# Patient Record
Sex: Female | Born: 1945 | Race: Black or African American | Hispanic: No | Marital: Married | Smoking: Former smoker
Health system: Southern US, Community
[De-identification: ages and names within clinical notes are randomized; demographics above are authoritative.]

## PROBLEM LIST (undated history)

## (undated) DIAGNOSIS — I7409 Other arterial embolism and thrombosis of abdominal aorta: Secondary | ICD-10-CM

## (undated) DIAGNOSIS — I10 Essential (primary) hypertension: Secondary | ICD-10-CM

## (undated) DIAGNOSIS — Z8249 Family history of ischemic heart disease and other diseases of the circulatory system: Secondary | ICD-10-CM

## (undated) DIAGNOSIS — E785 Hyperlipidemia, unspecified: Secondary | ICD-10-CM

## (undated) HISTORY — DX: Hyperlipidemia, unspecified: E78.5

## (undated) HISTORY — DX: Essential (primary) hypertension: I10

## (undated) HISTORY — DX: Family history of ischemic heart disease and other diseases of the circulatory system: Z82.49

## (undated) HISTORY — DX: Other arterial embolism and thrombosis of abdominal aorta: I74.09

---

## 1993-04-12 HISTORY — PX: ROTATOR CUFF REPAIR: SHX139

## 2002-11-21 ENCOUNTER — Inpatient Hospital Stay (HOSPITAL_COMMUNITY): Admission: EM | Admit: 2002-11-21 | Discharge: 2002-11-26 | Payer: Self-pay | Admitting: Emergency Medicine

## 2002-11-21 ENCOUNTER — Encounter (INDEPENDENT_AMBULATORY_CARE_PROVIDER_SITE_OTHER): Payer: Self-pay | Admitting: Specialist

## 2002-11-21 ENCOUNTER — Encounter: Payer: Self-pay | Admitting: Emergency Medicine

## 2002-11-21 ENCOUNTER — Encounter: Payer: Self-pay | Admitting: Surgery

## 2002-11-21 HISTORY — PX: APPENDECTOMY: SHX54

## 2002-11-22 ENCOUNTER — Encounter: Payer: Self-pay | Admitting: Surgery

## 2002-11-24 ENCOUNTER — Encounter: Payer: Self-pay | Admitting: Surgery

## 2002-12-24 ENCOUNTER — Encounter: Admission: RE | Admit: 2002-12-24 | Discharge: 2002-12-24 | Payer: Self-pay | Admitting: Surgery

## 2002-12-24 ENCOUNTER — Encounter: Payer: Self-pay | Admitting: Surgery

## 2002-12-28 ENCOUNTER — Encounter: Admission: RE | Admit: 2002-12-28 | Discharge: 2002-12-28 | Payer: Self-pay

## 2003-04-30 ENCOUNTER — Ambulatory Visit (HOSPITAL_COMMUNITY): Admission: RE | Admit: 2003-04-30 | Discharge: 2003-04-30 | Payer: Self-pay | Admitting: Cardiology

## 2003-10-30 ENCOUNTER — Encounter: Admission: RE | Admit: 2003-10-30 | Discharge: 2003-10-30 | Payer: Self-pay | Admitting: Family Medicine

## 2004-02-24 ENCOUNTER — Ambulatory Visit (HOSPITAL_COMMUNITY): Admission: RE | Admit: 2004-02-24 | Discharge: 2004-02-24 | Payer: Self-pay | Admitting: Gastroenterology

## 2004-05-06 ENCOUNTER — Encounter: Admission: RE | Admit: 2004-05-06 | Discharge: 2004-05-06 | Payer: Self-pay | Admitting: Internal Medicine

## 2004-05-13 ENCOUNTER — Encounter: Admission: RE | Admit: 2004-05-13 | Discharge: 2004-05-13 | Payer: Self-pay | Admitting: Internal Medicine

## 2004-06-02 ENCOUNTER — Encounter: Admission: RE | Admit: 2004-06-02 | Discharge: 2004-06-02 | Payer: Self-pay | Admitting: Internal Medicine

## 2004-06-30 ENCOUNTER — Encounter: Admission: RE | Admit: 2004-06-30 | Discharge: 2004-06-30 | Payer: Self-pay | Admitting: Internal Medicine

## 2004-08-04 ENCOUNTER — Other Ambulatory Visit: Admission: RE | Admit: 2004-08-04 | Discharge: 2004-08-04 | Payer: Self-pay | Admitting: Interventional Radiology

## 2004-08-04 ENCOUNTER — Encounter (INDEPENDENT_AMBULATORY_CARE_PROVIDER_SITE_OTHER): Payer: Self-pay | Admitting: Specialist

## 2004-08-04 ENCOUNTER — Encounter: Admission: RE | Admit: 2004-08-04 | Discharge: 2004-08-04 | Payer: Self-pay | Admitting: Internal Medicine

## 2005-06-03 ENCOUNTER — Encounter: Admission: RE | Admit: 2005-06-03 | Discharge: 2005-06-03 | Payer: Self-pay | Admitting: Internal Medicine

## 2005-08-26 ENCOUNTER — Encounter: Admission: RE | Admit: 2005-08-26 | Discharge: 2005-08-26 | Payer: Self-pay | Admitting: Internal Medicine

## 2006-04-12 HISTORY — PX: OTHER SURGICAL HISTORY: SHX169

## 2006-05-20 ENCOUNTER — Encounter: Admission: RE | Admit: 2006-05-20 | Discharge: 2006-05-20 | Payer: Self-pay | Admitting: Internal Medicine

## 2006-06-06 ENCOUNTER — Encounter: Admission: RE | Admit: 2006-06-06 | Discharge: 2006-06-06 | Payer: Self-pay | Admitting: Internal Medicine

## 2007-10-24 ENCOUNTER — Encounter: Admission: RE | Admit: 2007-10-24 | Discharge: 2007-10-24 | Payer: Self-pay | Admitting: Internal Medicine

## 2008-07-18 ENCOUNTER — Encounter: Admission: RE | Admit: 2008-07-18 | Discharge: 2008-07-18 | Payer: Self-pay | Admitting: Internal Medicine

## 2008-11-10 DEATH — deceased

## 2008-12-06 ENCOUNTER — Encounter: Admission: RE | Admit: 2008-12-06 | Discharge: 2008-12-06 | Payer: Self-pay | Admitting: Internal Medicine

## 2010-03-02 ENCOUNTER — Encounter: Admission: RE | Admit: 2010-03-02 | Discharge: 2010-03-02 | Payer: Self-pay | Admitting: Internal Medicine

## 2010-08-28 NOTE — Discharge Summary (Signed)
   NAME:  [name], Hannah                           ACCOUNT NO.:  [id number]   MEDICAL RECORD NO.:  [id number]                   PATIENT TYPE:  INP   LOCATION:  0362                                 FACILITY:  [facility]   PHYSICIAN:  Thornton Park. Daphine Deutscher, M.D.             DATE OF BIRTH:  1946/03/16   DATE OF ADMISSION:  11/20/2002  DATE OF DISCHARGE:  11/26/2002                                 DISCHARGE SUMMARY   ADMITTING DIAGNOSES:  Acute appendicitis.   DISCHARGE DIAGNOSES:  1. Acute retrocecal appendicitis.  2. Pneumonia.   HOSPITAL COURSE:  [name] is a 65 year old Jehovah's Witness who  presented to the emergency department and a CT scan had been done there  which was consistent with appendicitis.  Informed consent was obtained on  Wednesday morning regarding this and she was taken to the operating room at  that time where a laparoscopic appendectomy was performed with some  difficulty due to its extracecal location.  However, it was mobilized and  removed and postoperatively she did well.  24 hours into her  hospitalization, however, she developed some fevers and had wheezes and an x-  ray showed evidence possibly of pneumonia.  She was started on Fortaz.  She  was kept on that throughout the balance of her hospitalization.  She  responded well to that and her breathing improved and her bowels opened up  and she was begun on liquids and advanced.  Her hemoglobin stabilized and  her white count, although initially kind of went up, came back down.  She  was getting along well at the time of her discharge.  Incisions looked good.   A CT report questioned some of her vascular anatomy of her aortoiliac  region.  She denied any evidence of claudication or any problems with  vascular problems at all.  Will discuss this with her further in the office  after discharge to see if there are any other studies that might need to be  performed or if it needs to be repeated.   IMPRESSION:   Status post laparoscopic appendectomy for treatment of acute  appendicitis with hospital complication of pneumonia.                                               Thornton Park Daphine Deutscher, M.D.    MBM/MEDQ  D:  11/27/2002  T:  11/27/2002  Job:  [id number]

## 2010-08-28 NOTE — Op Note (Signed)
NAME:  Hannah [name], Hannah [name]               ACCOUNT NO.:  [id number]   MEDICAL RECORD NO.:  [id number]          PATIENT TYPE:  AMB   LOCATION:  ENDO                         FACILITY:  [facility]   PHYSICIAN:  Anselmo Rod, M.D.  DATE OF BIRTH:  1945/06/05   DATE OF PROCEDURE:  02/24/2004  DATE OF DISCHARGE:                                 OPERATIVE REPORT   PROCEDURE:  Screening colonoscopy.   ENDOSCOPIST:  Anselmo Rod, M.D.   INSTRUMENT USED:  Olympus video colonoscope.   INDICATIONS FOR PROCEDURE:  A 65 year old African-American female, Jehovah's  Witness, undergoing a screening colonoscopy to rule out colonic polyps,  masses, etc.   PREPROCEDURE PREPARATION:  Informed consent was procured from the patient.  10% miss rates of colon cancer and polyps have been discussed with the  patient as well.  The patient was fasted for eight hours prior to the  procedure and prepped with a bottle of magnesium citrate and a gallon of  GoLYTELY the night prior to the procedure.   PREPROCEDURE PHYSICAL EXAMINATION:  VITAL SIGNS:  Stable.  NECK:  Supple.  CHEST:  Clear to auscultation.  S1 and S2 regular.  ABDOMEN:  Soft with normal bowel sounds.   DESCRIPTION OF PROCEDURE:  The patient was placed in the left lateral  decubitus position and sedated with 60 mg of Demerol and 6 mg of Versed in  slow incremental doses.  Once the patient was adequately sedated and  maintained on low flow oxygen and continuous cardiac monitoring, the Olympus  video colonoscope was advanced from the rectum to the cecum and terminal  ileum.  The appendiceal orifice and ileocecal valve were clearly visualized  and photographed.  No masses, polyps, erosions, ulcerations, or diverticula  were seen in the terminal ileum.  Small internal hemorrhoids were seen on  retroflexion and the patient tolerated the procedure well without  complications.   IMPRESSION:  1.  Normal colonoscopy up to the terminal ileum except for small  internal      hemorrhoids.  2.  No masses, polyps, or diverticula seen.   RECOMMENDATIONS:  1.  Repeat colonoscopy has been recommended in the next five years unless      the patient develops any abnormal symptoms in the interim.  2.  Outpatient follow-up as need arises in the future.       JNM/MEDQ  D:  02/24/2004  T:  02/24/2004  Job:  [id number]   cc:   Ralene Ok, M.D.  [address]  [place]  [place] [address]  Fax: [phone]   Cristy Hilts. Jacinto Halim, MD  [address]  [place]  [place] [address]  Fax: [phone]

## 2010-08-28 NOTE — Op Note (Signed)
NAME:  Blundell, Anab                           ACCOUNT NO.:  [id number]   MEDICAL RECORD NO.:  [id number]                   PATIENT TYPE:  INP   LOCATION:  0105                                 FACILITY:  [facility]   PHYSICIAN:  Thornton Park. Daphine Deutscher, M.D.             DATE OF BIRTH:  08-02-1945   DATE OF PROCEDURE:  11/21/2002  DATE OF DISCHARGE:                                 OPERATIVE REPORT   PREOPERATIVE DIAGNOSIS:  Acute appendicitis.   POSTOPERATIVE DIAGNOSIS:  Acute retrocecal appendicitis.   OPERATION/PROCEDURE:  Laparoscopic appendectomy.   ANESTHESIA:  General.   SURGEON:  Thornton Park. Daphine Deutscher, M.D.   DESCRIPTION OF PROCEDURE:  Mrs. Fewell is a 65 year old Jehovah's Witness who  was seen in the ED with abdominal pain principally in the right flank which  seemed to be higher than usual for appendix.  She had an ultrasound which  suggested appendicitis but it was only a suggestion.  A CT scan confirmed  this.  Informed consent was obtained.   The patient was taken to the Room #1 after getting 3 g of Unasyn.  The  abdomen was prepped with Betadine and draped sterilely.  A longitudinal  incision was made down to the umbilicus and through a small longitudinal  slit in the fascia entered the abdomen and placed a Hasson cannula.  A 10/11  was placed in the left lower quadrant, a 5 in the upper abdomen and then I  began by mobilizing the cecum which was somewhat floppy and finally I did  find a little purulent fluid in the pelvis. The appendix appeared to be  thickened and edematous and inflamed lateral to the cecum and was enshrouded  in extraperitoneal tissue.  I grasped the cecum and pulled it over.  We used  the Harmonic scalpel to mobilize the appendix laterally, and then grasped  the base and lifted it.  I then was able to get underneath it and staple  across the base of the appendix.  I then elevated the appendix and went  along dividing the mesentery with the Harmonic scalpel.   This enabled me to  complete the mobilization and ultimately to free it from the lateral wall of  the cecum and place it in a bag.  The cecum itself appeared to be in good  shape and I stayed well away from it with the Harmonic.  Once placed in a  bag, it was easily brought out  through the umbilicus.  I went in,  reinspected the area and irrigated it.  There was no evidence of bleeding.  Everything else appeared to be intact.  Port sites had been injected with  Marcaine.  The umbilical port was repaired with two simple sutures of 0  Vicryl under laparoscopic vision.  The abdomen was then deflated and the  scope was withdrawn.  The patient seemed to tolerate this procedure well and  she  was taken to the recovery room in satisfactory condition.                                               Thornton Park Daphine Deutscher, M.D.    MBM/MEDQ  D:  11/21/2002  T:  11/21/2002  Job:  [id number]

## 2010-08-28 NOTE — H&P (Signed)
   NAME:  Hannah [name], Hannah [name]                           ACCOUNT NO.:  [id number]   MEDICAL RECORD NO.:  [id number]                   PATIENT TYPE:  INP   LOCATION:  0356                                 FACILITY:  [facility]   PHYSICIAN:  Thornton Park. Daphine Deutscher, M.D.             DATE OF BIRTH:  [birth year]   DATE OF ADMISSION:  11/20/2002  DATE OF DISCHARGE:                                HISTORY & PHYSICAL   CHIEF COMPLAINT:  Abdominal pain for about 24 hours.  Will evaluate in the  emergency room.   HISTORY:  This is a 65 year old lady with right flank pain, more so to the  right and back, who initially underwent a CT scan.  They questioned whether  there was some thickening of her appendix.  She subsequently underwent a CT  scan which showed evidence of appendicitis.  She was seen in the emergency  room, and informed consent was obtained regarding laparoscopic, as well as  open, appendectomy.   PAST MEDICAL HISTORY:  The patient has a history of anemia and is one of  Jehovah's Witnesses and does not take blood.  She has a history of GERD, but  has had no prior surgery.   ALLERGIES:  She has no known allergies.   CURRENT MEDICATIONS:  1. Remeron.  2. Aciphex.   PHYSICAL EXAMINATION:  GENERAL:  A pleasant black female with tenderness on  the right side.  In general, appeared unremarkable.  Appropriate weight.  VITAL SIGNS:  Temperature 98.9, pulse 98, blood pressure 200/100.  HEENT:  Head normocephalic.  Eyes clear, nonicteric.  Pupils equal, round  and reactive to light.  Nose and throat exam showed normal mucous membranes.  NECK:  Supple without adenopathy or thyromegaly.  CHEST:  Clear.  HEART:  Sinus rhythm without murmurs or gallops.  ABDOMEN:  The pain was above and to the right of McBurney's with tenderness  to palpation.  Bowel sounds were diminished.  EXTREMITIES:  Full range of motion without edema or clubbing.   CT scan was reviewed and was consistent with appendix.  White count  was  10,000.   PLAN:  Laparoscopic appendectomy.                                               Thornton Park Daphine Deutscher, M.D.    MBM/MEDQ  D:  11/23/2002  T:  11/23/2002  Job:  [id number]

## 2011-03-25 ENCOUNTER — Other Ambulatory Visit: Payer: Self-pay | Admitting: Internal Medicine

## 2011-03-25 DIAGNOSIS — Z1231 Encounter for screening mammogram for malignant neoplasm of breast: Secondary | ICD-10-CM

## 2011-04-20 ENCOUNTER — Ambulatory Visit
Admission: RE | Admit: 2011-04-20 | Discharge: 2011-04-20 | Disposition: A | Discharge status: Home / Self Care | Payer: Federal, State, Local not specified - PPO | Source: Ambulatory Visit | Attending: Internal Medicine | Admitting: Internal Medicine

## 2011-04-20 DIAGNOSIS — Z1231 Encounter for screening mammogram for malignant neoplasm of breast: Secondary | ICD-10-CM

## 2011-04-27 ENCOUNTER — Other Ambulatory Visit: Payer: Self-pay | Admitting: Internal Medicine

## 2011-04-27 DIAGNOSIS — R928 Other abnormal and inconclusive findings on diagnostic imaging of breast: Secondary | ICD-10-CM

## 2011-05-04 ENCOUNTER — Ambulatory Visit
Admission: RE | Admit: 2011-05-04 | Discharge: 2011-05-04 | Disposition: A | Discharge status: Home / Self Care | Payer: Federal, State, Local not specified - PPO | Source: Ambulatory Visit | Attending: Internal Medicine | Admitting: Internal Medicine

## 2011-05-04 DIAGNOSIS — R928 Other abnormal and inconclusive findings on diagnostic imaging of breast: Secondary | ICD-10-CM

## 2011-09-21 ENCOUNTER — Other Ambulatory Visit: Payer: Self-pay | Admitting: Internal Medicine

## 2011-09-21 ENCOUNTER — Ambulatory Visit
Admission: RE | Admit: 2011-09-21 | Discharge: 2011-09-21 | Disposition: A | Discharge status: Home / Self Care | Payer: Federal, State, Local not specified - PPO | Source: Ambulatory Visit | Attending: Internal Medicine | Admitting: Internal Medicine

## 2011-09-21 DIAGNOSIS — E041 Nontoxic single thyroid nodule: Secondary | ICD-10-CM

## 2011-09-21 DIAGNOSIS — R634 Abnormal weight loss: Secondary | ICD-10-CM

## 2011-09-27 ENCOUNTER — Ambulatory Visit
Admission: RE | Admit: 2011-09-27 | Discharge: 2011-09-27 | Disposition: A | Discharge status: Home / Self Care | Payer: Medicare Other | Source: Ambulatory Visit | Attending: Internal Medicine | Admitting: Internal Medicine

## 2011-09-27 DIAGNOSIS — E041 Nontoxic single thyroid nodule: Secondary | ICD-10-CM

## 2011-10-11 HISTORY — PX: TRANSTHORACIC ECHOCARDIOGRAM: SHX275

## 2011-10-11 HISTORY — PX: NM MYOCAR PERF WALL MOTION: HXRAD629

## 2011-11-01 ENCOUNTER — Encounter (HOSPITAL_COMMUNITY): Payer: Self-pay | Admitting: Pharmacy Technician

## 2011-11-02 ENCOUNTER — Other Ambulatory Visit: Payer: Self-pay | Admitting: Cardiovascular Disease

## 2011-11-10 ENCOUNTER — Ambulatory Visit (HOSPITAL_COMMUNITY)
Admission: RE | Admit: 2011-11-10 | Discharge: 2011-11-10 | Disposition: A | Discharge status: Home / Self Care | Payer: Medicare Other | Source: Ambulatory Visit | Attending: Cardiovascular Disease | Admitting: Cardiovascular Disease

## 2011-11-10 ENCOUNTER — Encounter (HOSPITAL_COMMUNITY)
Admission: RE | Disposition: A | Discharge status: Home / Self Care | Payer: Self-pay | Source: Ambulatory Visit | Attending: Cardiovascular Disease

## 2011-11-10 DIAGNOSIS — R079 Chest pain, unspecified: Secondary | ICD-10-CM | POA: Insufficient documentation

## 2011-11-10 DIAGNOSIS — I10 Essential (primary) hypertension: Secondary | ICD-10-CM | POA: Insufficient documentation

## 2011-11-10 DIAGNOSIS — Z8249 Family history of ischemic heart disease and other diseases of the circulatory system: Secondary | ICD-10-CM | POA: Insufficient documentation

## 2011-11-10 DIAGNOSIS — I7409 Other arterial embolism and thrombosis of abdominal aorta: Secondary | ICD-10-CM | POA: Insufficient documentation

## 2011-11-10 DIAGNOSIS — R9439 Abnormal result of other cardiovascular function study: Secondary | ICD-10-CM | POA: Insufficient documentation

## 2011-11-10 DIAGNOSIS — E785 Hyperlipidemia, unspecified: Secondary | ICD-10-CM | POA: Insufficient documentation

## 2011-11-10 HISTORY — PX: LEFT HEART CATHETERIZATION WITH CORONARY ANGIOGRAM: SHX5451

## 2011-11-10 SURGERY — LEFT HEART CATHETERIZATION WITH CORONARY ANGIOGRAM
Anesthesia: LOCAL

## 2011-11-10 MED ORDER — MIDAZOLAM HCL 2 MG/2ML IJ SOLN
INTRAMUSCULAR | Status: AC
  Filled 2011-11-10: qty 2

## 2011-11-10 MED ORDER — DIAZEPAM 5 MG PO TABS
5.0000 mg | ORAL_TABLET | ORAL | Status: AC
  Administered 2011-11-10: 5 mg via ORAL
  Filled 2011-11-10: qty 1

## 2011-11-10 MED ORDER — LIDOCAINE HCL (PF) 1 % IJ SOLN
INTRAMUSCULAR | Status: AC
  Filled 2011-11-10: qty 30

## 2011-11-10 MED ORDER — ACETAMINOPHEN 325 MG PO TABS: 650.0000 mg | ORAL_TABLET | ORAL | Status: DC | PRN

## 2011-11-10 MED ORDER — HEPARIN SODIUM (PORCINE) 1000 UNIT/ML IJ SOLN
INTRAMUSCULAR | Status: AC
  Filled 2011-11-10: qty 1

## 2011-11-10 MED ORDER — NITROGLYCERIN 0.2 MG/ML ON CALL CATH LAB
INTRAVENOUS | Status: AC
  Filled 2011-11-10: qty 1

## 2011-11-10 MED ORDER — ONDANSETRON HCL 4 MG/2ML IJ SOLN: 4.0000 mg | Freq: Four times a day (QID) | INTRAMUSCULAR | Status: DC | PRN

## 2011-11-10 MED ORDER — VERAPAMIL HCL 2.5 MG/ML IV SOLN
INTRAVENOUS | Status: AC
  Filled 2011-11-10: qty 2

## 2011-11-10 MED ORDER — SODIUM CHLORIDE 0.9 % IV SOLN: INTRAVENOUS | Status: DC

## 2011-11-10 MED ORDER — SODIUM CHLORIDE 0.9 % IV SOLN
INTRAVENOUS | Status: DC
  Administered 2011-11-10: 1000 mL via INTRAVENOUS

## 2011-11-10 MED ORDER — MORPHINE SULFATE 4 MG/ML IJ SOLN: 1.0000 mg | INTRAMUSCULAR | Status: DC | PRN

## 2011-11-10 MED ORDER — HEPARIN (PORCINE) IN NACL 2-0.9 UNIT/ML-% IJ SOLN
INTRAMUSCULAR | Status: AC
  Filled 2011-11-10: qty 2000

## 2011-11-10 MED ORDER — SODIUM CHLORIDE 0.9 % IJ SOLN: 3.0000 mL | INTRAMUSCULAR | Status: DC | PRN

## 2011-11-10 MED ORDER — FENTANYL CITRATE 0.05 MG/ML IJ SOLN
INTRAMUSCULAR | Status: AC
  Filled 2011-11-10: qty 2

## 2011-11-10 NOTE — Op Note (Signed)
Hannah [name] is a 66 y.o. female    [id number] LOCATION:  FACILITY: [facility]  PHYSICIAN: Nanetta Batty, M.D. [birth year]   DATE OF PROCEDURE:  11/10/2011  DATE OF DISCHARGE:  [facility]  CARDIAC CATHETERIZATION     History obtained from chart review. 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 She does not lifestyle limiting claudication. Refractors include hypertension, hyperlipidemia, remote tobacco abuse having quit 20 years ago. She has a strong family history of heart disease the father died of MI in his 71s and 2 brothers who have had bypass surgery. When I saw her back in February she had mild dyspnea on exertion but denied chest pain. Over the last several weeks she developed substernal chest burning going into both upper arms. A recent Myoview showed subtle ischemia in the LAD territory. Based on this she presents now for outpatient diagnostic coronary arteriography via the right radial approach.   PROCEDURE DESCRIPTION:    The patient was brought to the second floor  [facility] lab in the postabsorptive state. She was  premedicated with Valium 5 mg by mouth, IV Versed and fentanyl.. Her right wrist was prepped and shaved in usual sterile fashion. Xylocaine 1% was used  for local anesthesia. A 5 French sheath was inserted into the right radial  artery using standard Seldinger technique. The patient received  3000 units  of heparin  intravenously.  10 cc of "radial cocktail" were administered the SideArm sheath. A 5 [place] TIG catheter along the 5 French pigtail catheter were used for selective coronary angiography, left ventriculography, and distal abdominal aortography. Omnipaque dye was used for the entirety of the case. Retrograde aortic, left ventricular and pullback pressures were  recorded.    HEMODYNAMICS:    AO SYSTOLIC/AO DIASTOLIC: 95/52   LV SYSTOLIC/LV DIASTOLIC: 97/10  ANGIOGRAPHIC RESULTS:   1. Left main; normal  2. LAD; normal 3. Left circumflex; normal.  4. Right coronary artery; nondominant and normal 5. Left ventriculography; RAO left ventriculogram was performed using  25 mL Without wall motion abnormalities.  6. 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 The iliac arteries reconstituted by lumbar and IMA collaterals.  IMPRESSION:[name] had normal coronary arteries and normal left foot function. I believe her stress test was false positive or symptoms noncardiac. 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 It would be a good candidate for aortobifemoral bypass grafting for lifestyle in the claudication. The sheath was removed from the right radial artery and a TR band was placed. Patent hemostasis was achieved. The patient left the Cath Lab in stable condition. She was gently hydrated, and discharged home after 3 hours. I'll see her back in the office in one to 2 weeks for followup.  Runell Gess MD, [facility] 11/10/2011 10:01 AM

## 2011-11-10 NOTE — H&P (Signed)
  H & P will be scanned in.  Pt was reexamined and existing H & P reviewed. No changes found.  Runell Gess, MD [facility] 11/10/2011 9:53 AM

## 2012-06-01 ENCOUNTER — Other Ambulatory Visit: Payer: Self-pay | Admitting: Internal Medicine

## 2012-06-01 DIAGNOSIS — Z1231 Encounter for screening mammogram for malignant neoplasm of breast: Secondary | ICD-10-CM

## 2012-06-23 ENCOUNTER — Ambulatory Visit: Payer: Medicare Other

## 2012-07-12 ENCOUNTER — Ambulatory Visit: Payer: Medicare Other

## 2012-07-21 ENCOUNTER — Ambulatory Visit
Admission: RE | Admit: 2012-07-21 | Discharge: 2012-07-21 | Disposition: A | Discharge status: Home / Self Care | Payer: Medicare Other | Source: Ambulatory Visit | Attending: Internal Medicine | Admitting: Internal Medicine

## 2012-07-21 DIAGNOSIS — Z1231 Encounter for screening mammogram for malignant neoplasm of breast: Secondary | ICD-10-CM

## 2012-10-12 ENCOUNTER — Other Ambulatory Visit: Payer: Self-pay | Admitting: Cardiovascular Disease

## 2013-06-18 ENCOUNTER — Other Ambulatory Visit: Payer: Self-pay

## 2013-06-18 DIAGNOSIS — Z1231 Encounter for screening mammogram for malignant neoplasm of breast: Secondary | ICD-10-CM

## 2013-06-18 DIAGNOSIS — Z803 Family history of malignant neoplasm of breast: Secondary | ICD-10-CM

## 2013-06-25 ENCOUNTER — Ambulatory Visit (INDEPENDENT_AMBULATORY_CARE_PROVIDER_SITE_OTHER): Payer: Medicare Other | Admitting: Cardiovascular Disease

## 2013-06-25 ENCOUNTER — Encounter: Payer: Self-pay | Admitting: Cardiovascular Disease

## 2013-06-25 VITALS — BP 108/72 | HR 76 | Ht 62.0 in | Wt 123.0 lb

## 2013-06-25 DIAGNOSIS — I739 Peripheral vascular disease, unspecified: Secondary | ICD-10-CM

## 2013-06-25 DIAGNOSIS — E785 Hyperlipidemia, unspecified: Secondary | ICD-10-CM

## 2013-06-25 DIAGNOSIS — I10 Essential (primary) hypertension: Secondary | ICD-10-CM | POA: Insufficient documentation

## 2013-06-25 HISTORY — DX: Essential (primary) hypertension: I10

## 2013-06-25 HISTORY — DX: Peripheral vascular disease, unspecified: I73.9

## 2013-06-25 HISTORY — DX: Hyperlipidemia, unspecified: E78.5

## 2013-06-25 NOTE — Patient Instructions (Signed)
Your physician wants you to follow-up in: 1 year with Dr Berry. You will receive a reminder letter in the mail two months in advance. If you don't receive a letter, please call our office to schedule the follow-up appointment.  

## 2013-06-25 NOTE — Assessment & Plan Note (Signed)
On statin therapy followed by her PCP 

## 2013-06-25 NOTE — Progress Notes (Signed)
06/25/2013 Hannah [name]   Feb 01, 1946  [id number]  Primary Physician No PCP Per Patient Primary Cardiologist: Runell Gess MD FACP,FACC,FAHA, FSCAI   HPI:. She is a 68 year old thin-appearing married [place] American female, mother of 1 child, who I last saw 6 months ago. She has a history of normal coronary arteries by catheterization, which I performed November 10, 2011. 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 She really denies significant lifestyle-limiting claudication. Her other problems include remote tobacco abuse, having quit over 20 years ago, treated hypertension, hyperlipidemia, as well as a strong family history for heart disease with a father who had an MI in his 14s and 2 brothers who have had bypass surgery. This year ago she denies chest pain, shortness of breath or claudication. Her primary care physician, Dr. Ludwig Clarks, follows her lipid profile closely.   Current Outpatient Prescriptions  Medication Sig Dispense Refill  . aspirin EC 81 MG tablet Take 81 mg by mouth daily.      . clopidogrel (PLAVIX) 75 MG tablet Take 75 mg by mouth daily.      . Coenzyme Q-10 200 MG CAPS Take 1 capsule by mouth daily.      . diazepam (VALIUM) 5 MG tablet Take 0.5 mg by mouth daily.      . hydrochlorothiazide (MICROZIDE) 12.5 MG capsule Take 12.5 mg by mouth daily.      . metoprolol tartrate (LOPRESSOR) 25 MG tablet TAKE 1/2 TABLET BY MOUTH TWICE DAILY  30 tablet  7  . nitroGLYCERIN (NITROSTAT) 0.4 MG SL tablet Place 0.4 mg under the tongue every 5 (five) minutes as needed. For chest pain      . pantoprazole (PROTONIX) 40 MG tablet Take 40 mg by mouth daily.      . ramipril (ALTACE) 10 MG capsule Take 10 mg by mouth daily.      . rosuvastatin (CRESTOR) 40 MG tablet Take 40 mg by mouth daily.       No current facility-administered medications for this  visit.    No Known Allergies  History   Social History  . Marital Status: Married    Spouse Name: N/A    Number of Children: N/A  . Years of Education: N/A   Occupational History  . Not on file.   Social History Main Topics  . Smoking status: Former Games developer  . Smokeless tobacco: Not on file  . Alcohol Use: Not on file  . Drug Use: Not on file  . Sexual Activity: Not on file   Other Topics Concern  . Not on file   Social History Narrative  . No narrative on file     Review of Systems: General: negative for chills, fever, night sweats or weight changes.  Cardiovascular: negative for chest pain, dyspnea on exertion, edema, orthopnea, palpitations, paroxysmal nocturnal dyspnea or shortness of breath Dermatological: negative for rash Respiratory: negative for cough or wheezing Urologic: negative for hematuria Abdominal: negative for nausea, vomiting, diarrhea, bright red blood per rectum, melena, or hematemesis Neurologic: negative for visual changes, syncope, or dizziness All other systems reviewed and are otherwise negative except as noted above.    Blood pressure 108/72, pulse 76, height 5\' 2"  (1.575 m), weight 123 lb (55.792 kg).  General appearance: alert and no distress Neck: no adenopathy, no carotid bruit, no JVD, supple, symmetrical, trachea midline and thyroid not enlarged, symmetric,  no tenderness/mass/nodules Lungs: clear to auscultation bilaterally Heart: regular rate and rhythm, S1, S2 normal, no murmur, click, rub or gallop Abdomen: soft, non-tender; bowel sounds normal; no masses,  no organomegaly Extremities: extremities normal, atraumatic, no cyanosis or edema and and pedal pulses  EKG normal sinus rhythm at 76 without ST or T wave changes  ASSESSMENT AND PLAN:   Essential hypertension Controlled on current medications  Hyperlipidemia On statin therapy followed by her PCP  Peripheral arterial disease History of known distal abdominal aortic  occlusion from cardiac cath performed 2 years ago. She really denies claudication      Runell GessJonathan J. Annelyse Rey MD [facility] DistrictFACP,FACC,FAHA, [facility] 06/25/2013 3:50 PM

## 2013-06-25 NOTE — Assessment & Plan Note (Signed)
History of known distal abdominal aortic occlusion from cardiac cath performed 2 years ago. She really denies claudication

## 2013-06-25 NOTE — Assessment & Plan Note (Signed)
Controlled on current medications 

## 2013-08-06 ENCOUNTER — Ambulatory Visit: Payer: Medicare Other

## 2013-08-10 ENCOUNTER — Encounter (INDEPENDENT_AMBULATORY_CARE_PROVIDER_SITE_OTHER): Payer: Self-pay

## 2013-08-10 ENCOUNTER — Ambulatory Visit
Admission: RE | Admit: 2013-08-10 | Discharge: 2013-08-10 | Disposition: A | Discharge status: Home / Self Care | Payer: Medicare Other | Source: Ambulatory Visit

## 2013-08-10 DIAGNOSIS — Z803 Family history of malignant neoplasm of breast: Secondary | ICD-10-CM

## 2013-08-10 DIAGNOSIS — Z1231 Encounter for screening mammogram for malignant neoplasm of breast: Secondary | ICD-10-CM

## 2013-08-13 ENCOUNTER — Other Ambulatory Visit: Payer: Self-pay | Admitting: Internal Medicine

## 2013-08-13 DIAGNOSIS — N63 Unspecified lump in unspecified breast: Secondary | ICD-10-CM

## 2013-08-27 ENCOUNTER — Other Ambulatory Visit: Payer: Medicare Other

## 2013-08-31 ENCOUNTER — Other Ambulatory Visit: Payer: Self-pay | Admitting: Internal Medicine

## 2013-08-31 ENCOUNTER — Ambulatory Visit
Admission: RE | Admit: 2013-08-31 | Discharge: 2013-08-31 | Disposition: A | Discharge status: Home / Self Care | Payer: Medicare Other | Source: Ambulatory Visit | Attending: Internal Medicine | Admitting: Internal Medicine

## 2013-08-31 ENCOUNTER — Ambulatory Visit
Admission: RE | Admit: 2013-08-31 | Discharge: 2013-08-31 | Disposition: A | Discharge status: Home / Self Care | Payer: Medicare Other | Source: Ambulatory Visit

## 2013-08-31 DIAGNOSIS — N63 Unspecified lump in unspecified breast: Secondary | ICD-10-CM

## 2013-09-13 ENCOUNTER — Encounter: Payer: Self-pay | Admitting: Cardiovascular Disease

## 2014-01-04 ENCOUNTER — Encounter: Payer: Self-pay | Admitting: Cardiovascular Disease

## 2014-03-21 ENCOUNTER — Encounter (HOSPITAL_COMMUNITY): Payer: Self-pay | Admitting: Cardiovascular Disease

## 2014-06-27 ENCOUNTER — Telehealth: Payer: Self-pay | Admitting: Cardiovascular Disease

## 2014-07-01 NOTE — Telephone Encounter (Signed)
Closed encounter °

## 2014-07-04 ENCOUNTER — Encounter: Payer: Self-pay | Admitting: *Deleted

## 2014-07-12 ENCOUNTER — Encounter: Payer: Self-pay | Admitting: Cardiovascular Disease

## 2014-07-16 ENCOUNTER — Ambulatory Visit: Payer: Medicare Other | Admitting: Cardiovascular Disease

## 2014-07-23 ENCOUNTER — Telehealth: Payer: Self-pay | Admitting: Cardiovascular Disease

## 2014-07-23 NOTE — Telephone Encounter (Signed)
Received records from Dr Ralene Okoy Moreira for appointment with Dr Allyson SabalBerry on 08/09/14.  Records given to [employer] (medical records) for Dr Hazle CocaBerry's schedule on 08/09/14.  lp

## 2014-08-09 ENCOUNTER — Encounter: Payer: Self-pay | Admitting: Cardiovascular Disease

## 2014-08-09 ENCOUNTER — Ambulatory Visit (INDEPENDENT_AMBULATORY_CARE_PROVIDER_SITE_OTHER): Payer: Medicare Other | Admitting: Cardiovascular Disease

## 2014-08-09 VITALS — BP 142/80 | HR 76 | Ht 62.5 in | Wt 122.6 lb

## 2014-08-09 DIAGNOSIS — I10 Essential (primary) hypertension: Secondary | ICD-10-CM | POA: Diagnosis not present

## 2014-08-09 DIAGNOSIS — E785 Hyperlipidemia, unspecified: Secondary | ICD-10-CM | POA: Diagnosis not present

## 2014-08-09 DIAGNOSIS — I739 Peripheral vascular disease, unspecified: Secondary | ICD-10-CM | POA: Diagnosis not present

## 2014-08-09 DIAGNOSIS — R079 Chest pain, unspecified: Secondary | ICD-10-CM

## 2014-08-09 HISTORY — DX: Chest pain, unspecified: R07.9

## 2014-08-09 NOTE — Assessment & Plan Note (Signed)
The patient has had chest pain for the last 2 weeks. She went to the emergency room but was not seen by ER physician. She's had pain on a daily basis. She is on antireflux medications. Of note, at the time of cardiac catheterization she would had angiographic documented normal coronary arteries performed 11/10/11. While it is unlikely that she's developed significant CAD in that short time. I think it warrants getting a pharmacologic Myoview stress test to rule out an ischemic etiology.

## 2014-08-09 NOTE — Progress Notes (Signed)
08/09/2014 Hannah FisherMarilyn M [name]   04/13/1945  [id number]  Primary Physician No PCP Per Patient Primary Cardiologist: Runell GessJonathan J. Rashid Whitenight MD FACP,FACC,FAHA, FSCAI   HPI:  . She is a 69 year old thin-appearing married [place] American female, mother of 1 child, who I last saw 13 months ago. She has a history of normal coronary arteries by catheterization, which I performed November 10, 2011. 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 She really denies significant lifestyle-limiting claudication. Her other problems include remote tobacco abuse, having quit over 20 years ago, treated hypertension, hyperlipidemia, as well as a strong family history for heart disease with a father who had an MI in his 7730s and 2 brothers who have had bypass surgery. Since I saw her a year ago she has had new onset chest pain or last several weeks. She went to the emergency room but did not see an ER physician. She does have a history of GERD on Protonix. She still denies claudication.  Current Outpatient Prescriptions  Medication Sig Dispense Refill  . aspirin EC 81 MG tablet Take 81 mg by mouth daily.    . clopidogrel (PLAVIX) 75 MG tablet Take 75 mg by mouth daily.    . Coenzyme Q-10 200 MG CAPS Take 1 capsule by mouth daily.    . diazepam (VALIUM) 5 MG tablet Take 0.5 mg by mouth daily.    . hydrochlorothiazide (MICROZIDE) 12.5 MG capsule Take 12.5 mg by mouth daily.    . metoprolol tartrate (LOPRESSOR) 25 MG tablet TAKE 1/2 TABLET BY MOUTH TWICE DAILY 30 tablet 7  . pantoprazole (PROTONIX) 40 MG tablet Take 40 mg by mouth daily.    . ramipril (ALTACE) 10 MG capsule Take 10 mg by mouth daily.    . rosuvastatin (CRESTOR) 40 MG tablet Take 40 mg by mouth daily.    . Vitamin D, Ergocalciferol, (DRISDOL) 50000 UNITS CAPS capsule Take 1 capsule by mouth once a week.  5   No current  facility-administered medications for this visit.    No Known Allergies  History   Social History  . Marital Status: Married    Spouse Name: N/A  . Number of Children: 1  . Years of Education: 12   Occupational History  . Not on file.   Social History Main Topics  . Smoking status: Former Games developermoker  . Smokeless tobacco: Not on file  . Alcohol Use: Not on file  . Drug Use: Not on file  . Sexual Activity: Not on file   Other Topics Concern  . Not on file   Social History Narrative     Review of Systems: General: negative for chills, fever, night sweats or weight changes.  Cardiovascular: negative for chest pain, dyspnea on exertion, edema, orthopnea, palpitations, paroxysmal nocturnal dyspnea or shortness of breath Dermatological: negative for rash Respiratory: negative for cough or wheezing Urologic: negative for hematuria Abdominal: negative for nausea, vomiting, diarrhea, bright red blood per rectum, melena, or hematemesis Neurologic: negative for visual changes, syncope, or dizziness All other systems reviewed and are otherwise negative except as noted above.    Blood pressure 142/80, pulse 76, height 5' 2.5" (1.588 m), weight 122 lb 9.6 oz (55.611 kg).  General appearance: alert and no distress Neck: no adenopathy, no carotid bruit, no JVD, supple, symmetrical, trachea midline and thyroid not enlarged, symmetric, no tenderness/mass/nodules Lungs: clear to auscultation bilaterally Heart:  regular rate and rhythm, S1, S2 normal, no murmur, click, rub or gallop Extremities: extremities normal, atraumatic, no cyanosis or edema  EKG normal sinus rhythm at 76 without ST or T-wave changes. I personally reviewed this EKG  ASSESSMENT AND PLAN:   Peripheral arterial disease History of peripheral arterial disease with angiographically documented Leriche syndrome but with minimal symptoms.   Hyperlipidemia History of hyperlipidemia on Crestor 40 mg a day followed by her  PCP   Essential hypertension History of hypertension blood pressure measured at 142/80. She is on hydrochlorothiazide, ramipril and metoprolol. Continue current meds at current dosing   Chest pain The patient has had chest pain for the last 2 weeks. She went to the emergency room but was not seen by ER physician. She's had pain on a daily basis. She is on antireflux medications. Of note, at the time of cardiac catheterization she would had angiographic documented normal coronary arteries performed 11/10/11. While it is unlikely that she's developed significant CAD in that short time. I think it warrants getting a pharmacologic Myoview stress test to rule out an ischemic etiology.       Runell Gess MD FACP,FACC,FAHA, [facility] 08/09/2014 2:52 PM

## 2014-08-09 NOTE — Assessment & Plan Note (Signed)
History of hyperlipidemia on Crestor 40 mg a day followed by her PCP 

## 2014-08-09 NOTE — Assessment & Plan Note (Signed)
History of hypertension blood pressure measured at 142/80. She is on hydrochlorothiazide, ramipril and metoprolol. Continue current meds at current dosing

## 2014-08-09 NOTE — Patient Instructions (Signed)
Dr Allyson SabalBerry has requested that you have a lexiscan myoview. For further information please visit [email or web address] Please follow instruction sheet, as given.  Dr Allyson SabalBerry recommends that you schedule a follow-up appointment in 1 year. You will receive a reminder letter in the mail two months in advance. If you don't receive a letter, please call our office to schedule the follow-up appointment.

## 2014-08-09 NOTE — Assessment & Plan Note (Signed)
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

## 2014-08-20 ENCOUNTER — Other Ambulatory Visit: Payer: Self-pay | Admitting: Gastroenterology

## 2014-08-20 DIAGNOSIS — R1011 Right upper quadrant pain: Secondary | ICD-10-CM

## 2014-08-20 DIAGNOSIS — R110 Nausea: Secondary | ICD-10-CM

## 2014-08-23 ENCOUNTER — Ambulatory Visit
Admission: RE | Admit: 2014-08-23 | Discharge: 2014-08-23 | Disposition: A | Discharge status: Home / Self Care | Payer: Medicare Other | Source: Ambulatory Visit | Attending: Internal Medicine | Admitting: Internal Medicine

## 2014-08-23 ENCOUNTER — Other Ambulatory Visit: Payer: Self-pay | Admitting: Internal Medicine

## 2014-08-23 DIAGNOSIS — R059 Cough, unspecified: Secondary | ICD-10-CM

## 2014-08-23 DIAGNOSIS — R05 Cough: Secondary | ICD-10-CM

## 2014-08-26 ENCOUNTER — Ambulatory Visit (HOSPITAL_COMMUNITY)
Admission: RE | Admit: 2014-08-26 | Discharge: 2014-08-26 | Disposition: A | Discharge status: Home / Self Care | Payer: Medicare Other | Source: Ambulatory Visit | Attending: Gastroenterology | Admitting: Gastroenterology

## 2014-08-26 DIAGNOSIS — R1011 Right upper quadrant pain: Secondary | ICD-10-CM | POA: Insufficient documentation

## 2014-08-26 DIAGNOSIS — R110 Nausea: Secondary | ICD-10-CM | POA: Diagnosis not present

## 2014-08-27 ENCOUNTER — Encounter (HOSPITAL_COMMUNITY): Payer: Medicare Other

## 2014-10-07 ENCOUNTER — Other Ambulatory Visit: Payer: Self-pay

## 2014-10-18 ENCOUNTER — Other Ambulatory Visit: Payer: Self-pay

## 2014-10-18 DIAGNOSIS — Z803 Family history of malignant neoplasm of breast: Secondary | ICD-10-CM

## 2014-10-18 DIAGNOSIS — Z1231 Encounter for screening mammogram for malignant neoplasm of breast: Secondary | ICD-10-CM

## 2014-10-25 ENCOUNTER — Other Ambulatory Visit: Payer: Self-pay | Admitting: Internal Medicine

## 2014-10-25 DIAGNOSIS — M8580 Other specified disorders of bone density and structure, unspecified site: Secondary | ICD-10-CM

## 2014-11-26 ENCOUNTER — Ambulatory Visit
Admission: RE | Admit: 2014-11-26 | Discharge: 2014-11-26 | Disposition: A | Discharge status: Home / Self Care | Payer: Medicare Other | Source: Ambulatory Visit

## 2014-11-26 ENCOUNTER — Ambulatory Visit
Admission: RE | Admit: 2014-11-26 | Discharge: 2014-11-26 | Disposition: A | Discharge status: Home / Self Care | Payer: Medicare Other | Source: Ambulatory Visit | Attending: Internal Medicine | Admitting: Internal Medicine

## 2014-11-26 DIAGNOSIS — Z1231 Encounter for screening mammogram for malignant neoplasm of breast: Secondary | ICD-10-CM

## 2014-11-26 DIAGNOSIS — M8580 Other specified disorders of bone density and structure, unspecified site: Secondary | ICD-10-CM

## 2014-11-26 DIAGNOSIS — Z803 Family history of malignant neoplasm of breast: Secondary | ICD-10-CM

## 2015-09-17 ENCOUNTER — Ambulatory Visit (INDEPENDENT_AMBULATORY_CARE_PROVIDER_SITE_OTHER): Payer: Medicare Other | Admitting: Cardiovascular Disease

## 2015-09-17 ENCOUNTER — Encounter (INDEPENDENT_AMBULATORY_CARE_PROVIDER_SITE_OTHER): Payer: Medicare Other

## 2015-09-17 ENCOUNTER — Encounter: Payer: Self-pay | Admitting: Cardiovascular Disease

## 2015-09-17 VITALS — BP 134/72 | HR 72 | Ht 61.0 in | Wt 121.0 lb

## 2015-09-17 DIAGNOSIS — I10 Essential (primary) hypertension: Secondary | ICD-10-CM

## 2015-09-17 DIAGNOSIS — R079 Chest pain, unspecified: Secondary | ICD-10-CM | POA: Diagnosis not present

## 2015-09-17 DIAGNOSIS — R0602 Shortness of breath: Secondary | ICD-10-CM

## 2015-09-17 DIAGNOSIS — I739 Peripheral vascular disease, unspecified: Secondary | ICD-10-CM

## 2015-09-17 DIAGNOSIS — R002 Palpitations: Secondary | ICD-10-CM

## 2015-09-17 DIAGNOSIS — E785 Hyperlipidemia, unspecified: Secondary | ICD-10-CM

## 2015-09-17 NOTE — Assessment & Plan Note (Signed)
History of hypertension with blood pressure measured at 134/72 She is on hydrochlorothiazide, metoprolol and ramipril. Continue current meds at current dosing

## 2015-09-17 NOTE — Progress Notes (Deleted)
     09/17/2015 Alver FisherMarilyn M [name]   02/13/1946  [id number]  Primary Physician Ralene OkMOREIRA,ROY, MD Primary Cardiologist: Runell GessJonathan J [name] MD Roseanne RenoFACP, FACC, FAHA, FSCAI  HPI:  ***   Current Outpatient Prescriptions  Medication Sig Dispense Refill  . aspirin EC 81 MG tablet Take 81 mg by mouth daily.    . Cholecalciferol (VITAMIN D3) 2000 units TABS Take 2,000 Units by mouth daily.    . clopidogrel (PLAVIX) 75 MG tablet Take 75 mg by mouth daily.    . Coenzyme Q-10 200 MG CAPS Take 1 capsule by mouth daily.    Marland Kitchen. DEXILANT 60 MG capsule Take 60 mg by mouth daily.  0  . diazepam (VALIUM) 5 MG tablet Take 0.5 mg by mouth daily.    . hydrochlorothiazide (MICROZIDE) 12.5 MG capsule Take 12.5 mg by mouth daily.    . metoprolol tartrate (LOPRESSOR) 25 MG tablet TAKE 1/2 TABLET BY MOUTH TWICE DAILY 30 tablet 7  . ramipril (ALTACE) 10 MG capsule Take 10 mg by mouth daily.    . rosuvastatin (CRESTOR) 40 MG tablet Take 40 mg by mouth daily.     No current facility-administered medications for this visit.    No Known Allergies  Social History   Social History  . Marital Status: Married    Spouse Name: N/A  . Number of Children: 1  . Years of Education: 12   Occupational History  . Not on file.   Social History Main Topics  . Smoking status: Former Games developermoker  . Smokeless tobacco: Not on file  . Alcohol Use: Not on file  . Drug Use: Not on file  . Sexual Activity: Not on file   Other Topics Concern  . Not on file   Social History Narrative     Review of Systems: General: negative for chills, fever, night sweats or weight changes.  Cardiovascular: negative for chest pain, dyspnea on exertion, edema, orthopnea, palpitations, paroxysmal nocturnal dyspnea or shortness of breath Dermatological: negative for rash Respiratory: negative for cough or wheezing Urologic: negative for hematuria Abdominal: negative for nausea, vomiting, diarrhea, bright red blood per rectum, melena, or  hematemesis Neurologic: negative for visual changes, syncope, or dizziness All other systems reviewed and are otherwise negative except as noted above.    Blood pressure 134/72, pulse 72, height 5\' 1"  (1.549 m), weight 121 lb (54.885 kg).  {Physical DGLO:7564332}Exam:[id number]}  EKG ***  ASSESSMENT AND PLAN:   No problem-specific assessment & plan notes found for this encounter.      Runell GessJonathan J. Rufus Cypert MD FACP,FACC,FAHA, [facility] LPFSCAI 09/17/2015 4:07 PM

## 2015-09-17 NOTE — Assessment & Plan Note (Signed)
History of atypical chest pain recent onset duration to her jaw and left upper extremity associated with some shortness of breath. She did have a stress test back in 2013 which led to a cardiac catheterization revealing normal coronary arteries. We will check a 2-D echo and pharmacologic Myoview stress test.

## 2015-09-17 NOTE — Patient Instructions (Signed)
Medication Instructions:  Your physician recommends that you continue on your current medications as directed. Please refer to the Current Medication list given to you today.   Labwork: NONE  Testing/Procedures: Your physician has requested that you have an echocardiogram. Echocardiography is a painless test that uses sound waves to create images of your heart. It provides your doctor with information about the size and shape of your heart and how well your heart's chambers and valves are working. This procedure takes approximately one hour. There are no restrictions for this procedure.  Your physician has requested that you have a lexiscan myoview. For further information please visit [email or web address]. Please follow instruction sheet, as given.  Your physician has recommended that you wear an event monitor. Event monitors are medical devices that record the heart's electrical activity. Doctors most often us these monitors to diagnose arrhythmias. Arrhythmias are problems with the speed or rhythm of the heartbeat. The monitor is a small, portable device. You can wear one while you do your normal daily activities. This is usually used to diagnose what is causing palpitations/syncope (passing out).   Follow-Up: Your physician wants you to follow-up in: 6 MONTHS WITH DR Allyson SabalBERRY. You will receive a reminder letter in the mail two months in advance. If you don't receive a letter, please call our office to schedule the follow-up appointment.   Pharmacologic Stress Electrocardiogram A pharmacologic stress electrocardiogram is a heart (cardiac) test that uses nuclear imaging to evaluate the blood supply to your heart. This test may also be called a pharmacologic stress electrocardiography. Pharmacologic means that a medicine is used to increase your heart rate and blood pressure.  This stress test is done to find areas of poor blood flow to the heart by determining the extent of coronary artery disease  (CAD). Some people exercise on a treadmill, which naturally increases the blood flow to the heart. For those people unable to exercise on a treadmill, a medicine is used. This medicine stimulates your heart and will cause your heart to beat harder and more quickly, as if you were exercising.  Pharmacologic stress tests can help determine:  The adequacy of blood flow to your heart during increased levels of activity in order to clear you for discharge home.  The extent of coronary artery blockage caused by CAD.  Your prognosis if you have suffered a heart attack.  The effectiveness of cardiac procedures done, such as an angioplasty, which can increase the circulation in your coronary arteries.  Causes of chest pain or pressure. LET [facility] PROVIDER KNOW ABOUT:  Any allergies you have.  All medicines you are taking, including vitamins, herbs, eye drops, creams, and over-the-counter medicines.  Previous problems you or members of your family have had with the use of anesthetics.  Any blood disorders you have.  Previous surgeries you have had.  Medical conditions you have.  Possibility of pregnancy, if this applies.  If you are currently breastfeeding. RISKS AND COMPLICATIONS Generally, this is a safe procedure. However, as with any procedure, complications can occur. Possible complications include:  You develop pain or pressure in the following areas:  Chest.  Jaw or neck.  Between your shoulder blades.  Radiating down your left arm.  Headache.  Dizziness or light-headedness.  Shortness of breath.  Increased or irregular heartbeat.  Low blood pressure.  Nausea or vomiting.  Flushing.  Redness going up the arm and slight pain during injection of medicine.  Heart attack (rare). BEFORE THE PROCEDURE  Avoid all forms of caffeine for 24 hours before your test or as directed by your health care provider. This includes coffee, tea (even decaffeinated tea),  caffeinated sodas, chocolate, cocoa, and certain pain medicines.  Follow your health care provider's instructions regarding eating and drinking before the test.  Take your medicines as directed at regular times with water unless instructed otherwise. Exceptions may include:  If you have diabetes, ask how you are to take your insulin or pills. It is common to adjust insulin dosing the morning of the test.  If you are taking beta-blocker medicines, it is important to talk to your health care provider about these medicines well before the date of your test. Taking beta-blocker medicines may interfere with the test. In some cases, these medicines need to be changed or stopped 24 hours or more before the test.  If you wear a nitroglycerin patch, it may need to be removed prior to the test. Ask your health care provider if the patch should be removed before the test.  If you use an inhaler for any breathing condition, bring it with you to the test.  If you are an outpatient, bring a snack so you can eat right after the stress phase of the test.  Do not smoke for 4 hours prior to the test or as directed by your health care provider.  Do not apply lotions, powders, creams, or oils on your chest prior to the test.  Wear comfortable shoes and clothing. Let your health care provider know if you were unable to complete or follow the preparations for your test. PROCEDURE   Multiple patches (electrodes) will be put on your chest. If needed, small areas of your chest may be shaved to get better contact with the electrodes. Once the electrodes are attached to your body, multiple wires will be attached to the electrodes, and your heart rate will be monitored.  An IV access will be started. A nuclear trace (isotope) is given. The isotope may be given intravenously, or it may be swallowed. Nuclear refers to several types of radioactive isotopes, and the nuclear isotope lights up the arteries so that the  nuclear images are clear. The isotope is absorbed by your body. This results in low radiation exposure.  A resting nuclear image is taken to show how your heart functions at rest.  A medicine is given through the IV access.  A second scan is done about 1 hour after the medicine injection and determines how your heart functions under stress.  During this stress phase, you will be connected to an electrocardiogram machine. Your blood pressure and oxygen levels will be monitored. AFTER THE PROCEDURE   Your heart rate and blood pressure will be monitored after the test.  You may return to your normal schedule, including diet,activities, and medicines, unless your health care provider tells you otherwise.   This information is not intended to replace advice given to you by your health care provider. Make sure you discuss any questions you have with your health care provider.   Document Released: 08/15/2008 Document Revised: 04/03/2013 Document Reviewed: 12/04/2012 Elsevier Interactive Patient Education 2016 Elsevier Inc.   Cardiac Event Monitoring A cardiac event monitor is a small recording device used to help detect abnormal heart rhythms (arrhythmias). The monitor is used to record heart rhythm when noticeable symptoms such as the following occur:  Fast heartbeats (palpitations), such as heart racing or fluttering.  Dizziness.  Fainting or light-headedness.  Unexplained weakness. The monitor  is wired to two electrodes placed on your chest. Electrodes are flat, sticky disks that attach to your skin. The monitor can be worn for up to 30 days. You will wear the monitor at all times, except when bathing.  HOW TO USE YOUR CARDIAC EVENT MONITOR A technician will prepare your chest for the electrode placement. The technician will show you how to place the electrodes, how to work the monitor, and how to replace the batteries. Take time to practice using the monitor before you leave the  office. Make sure you understand how to send the information from the monitor to your health care provider. This requires a telephone with a landline, not a cell phone. You need to:  Wear your monitor at all times, except when you are in water:  Do not get the monitor wet.  Take the monitor off when bathing. Do not swim or use a hot tub with it on.  Keep your skin clean. Do not put body lotion or moisturizer on your chest.  Change the electrodes daily or any time they stop sticking to your skin. You might need to use tape to keep them on.  It is possible that your skin under the electrodes could become irritated. To keep this from happening, try to put the electrodes in slightly different places on your chest. However, they must remain in the area under your left breast and in the upper right section of your chest.  Make sure the monitor is safely clipped to your clothing or in a location close to your body that your health care provider recommends.  Press the button to record when you feel symptoms of heart trouble, such as dizziness, weakness, light-headedness, palpitations, thumping, shortness of breath, unexplained weakness, or a fluttering or racing heart. The monitor is always on and records what happened slightly before you pressed the button, so do not worry about being too late to get good information.  Keep a diary of your activities, such as walking, doing chores, and taking medicine. It is especially important to note what you were doing when you pushed the button to record your symptoms. This will help your health care provider determine what might be contributing to your symptoms. The information stored in your monitor will be reviewed by your health care provider alongside your diary entries.  Send the recorded information as recommended by your health care provider. It is important to understand that it will take some time for your health care provider to process the  results.  Change the batteries as recommended by your health care provider. SEEK IMMEDIATE MEDICAL CARE IF:   You have chest pain.  You have extreme difficulty breathing or shortness of breath.  You develop a very fast heartbeat that persists.  You develop dizziness that does not go away.  You faint or constantly feel you are about to faint.   This information is not intended to replace advice given to you by your health care provider. Make sure you discuss any questions you have with your health care provider.   Document Released: 01/06/2008 Document Revised: 04/19/2014 Document Reviewed: 09/25/2012 Elsevier Interactive Patient Education [employer]     If you need a refill on your cardiac medications before your next appointment, please call your pharmacy.

## 2015-09-17 NOTE — Assessment & Plan Note (Signed)
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

## 2015-09-17 NOTE — Progress Notes (Signed)
09/17/2015 Alver FisherMarilyn M Kittrell   12/01/1945  [id number]  Primary Physician Ralene OkMOREIRA,ROY, MD Primary Cardiologist: Runell GessJonathan J Aidyn Kellis MD Roseanne RenoFACP, FACC, FAHA, FSCAI  HPI:  She is a 70 year old thin-appearing married African American female, mother of 1 child, who I last saw 08/09/14.Marland Kitchen. She has a history of normal coronary arteries by catheterization, which I performed November 10, 2011. 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 She really denies significant lifestyle-limiting claudication. Her other problems include remote tobacco abuse, having quit over 20 years ago, treated hypertension, hyperlipidemia, as well as a strong family history for heart disease with a father who had an MI in his 6230s and 2 brothers who have had bypass surgery. Since I saw her a year ago she developed some chest pressure with radiation to her jaw and left upper extremity along with some shortness of breath over the last several weeks. She's also had some palpitations in the morning.  Current Outpatient Prescriptions  Medication Sig Dispense Refill  . aspirin EC 81 MG tablet Take 81 mg by mouth daily.    . Cholecalciferol (VITAMIN D3) 2000 units TABS Take 2,000 Units by mouth daily.    . clopidogrel (PLAVIX) 75 MG tablet Take 75 mg by mouth daily.    . Coenzyme Q-10 200 MG CAPS Take 1 capsule by mouth daily.    Marland Kitchen. DEXILANT 60 MG capsule Take 60 mg by mouth daily.  0  . diazepam (VALIUM) 5 MG tablet Take 0.5 mg by mouth daily.    . hydrochlorothiazide (MICROZIDE) 12.5 MG capsule Take 12.5 mg by mouth daily.    . metoprolol tartrate (LOPRESSOR) 25 MG tablet TAKE 1/2 TABLET BY MOUTH TWICE DAILY 30 tablet 7  . ramipril (ALTACE) 10 MG capsule Take 10 mg by mouth daily.    . rosuvastatin (CRESTOR) 40 MG tablet Take 40 mg by mouth daily.     No current facility-administered medications for this visit.     No Known Allergies  Social History   Social History  . Marital Status: Married    Spouse Name: N/A  . Number of Children: 1  . Years of Education: 12   Occupational History  . Not on file.   Social History Main Topics  . Smoking status: Former Games developermoker  . Smokeless tobacco: Not on file  . Alcohol Use: Not on file  . Drug Use: Not on file  . Sexual Activity: Not on file   Other Topics Concern  . Not on file   Social History Narrative     Review of Systems: General: negative for chills, fever, night sweats or weight changes.  Cardiovascular: negative for chest pain, dyspnea on exertion, edema, orthopnea, palpitations, paroxysmal nocturnal dyspnea or shortness of breath Dermatological: negative for rash Respiratory: negative for cough or wheezing Urologic: negative for hematuria Abdominal: negative for nausea, vomiting, diarrhea, bright red blood per rectum, melena, or hematemesis Neurologic: negative for visual changes, syncope, or dizziness All other systems reviewed and are otherwise negative except as noted above.    Blood pressure 134/72, pulse 72, height 5\' 1"  (1.549 m), weight 121 lb (54.885 kg).  General appearance: alert and no distress Neck: no adenopathy, no carotid bruit, no JVD, supple, symmetrical, trachea midline and thyroid not enlarged, symmetric, no tenderness/mass/nodules Lungs: clear to auscultation bilaterally Heart: regular rate and rhythm, S1, S2 normal, no murmur, click, rub or gallop Extremities:  extremities normal, atraumatic, no cyanosis or edema  EKG normal sinus rhythm at 72 without ST or T-wave changes. 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  Essential hypertension History of hypertension with blood pressure measured at 134/72 She is on hydrochlorothiazide, metoprolol and ramipril. Continue current meds at current dosing  Hyperlipidemia History of hyperlipidemia on statin therapy followed by her PCP  Chest pain History of atypical chest pain recent onset duration to her jaw and left upper extremity associated with some shortness of breath. She did have a stress test back in 2013 which led to a cardiac catheterization revealing normal coronary arteries. We will check a 2-D echo and pharmacologic Myoview stress test.       Runell Gess MD [facility], [facility] 09/17/2015 4:19 PM

## 2015-09-17 NOTE — Assessment & Plan Note (Signed)
History of hyperlipidemia on statin therapy followed by her PCP. 

## 2015-09-24 ENCOUNTER — Telehealth: Payer: Self-pay | Admitting: Cardiovascular Disease

## 2015-09-24 NOTE — Telephone Encounter (Signed)
Left message for patient to call.  She needs to schedule echo and lexiscan.

## 2015-10-07 ENCOUNTER — Encounter (HOSPITAL_COMMUNITY): Payer: Medicare Other

## 2015-10-12 DIAGNOSIS — R079 Chest pain, unspecified: Secondary | ICD-10-CM | POA: Diagnosis not present

## 2015-10-12 DIAGNOSIS — R002 Palpitations: Secondary | ICD-10-CM | POA: Diagnosis not present

## 2015-10-12 DIAGNOSIS — R0602 Shortness of breath: Secondary | ICD-10-CM | POA: Diagnosis not present

## 2015-10-20 ENCOUNTER — Other Ambulatory Visit: Payer: Self-pay

## 2015-10-20 ENCOUNTER — Ambulatory Visit (HOSPITAL_COMMUNITY): Payer: Medicare Other | Attending: Cardiovascular Disease

## 2015-10-20 DIAGNOSIS — R079 Chest pain, unspecified: Secondary | ICD-10-CM | POA: Diagnosis not present

## 2015-10-20 DIAGNOSIS — R0602 Shortness of breath: Secondary | ICD-10-CM | POA: Insufficient documentation

## 2015-10-20 DIAGNOSIS — R002 Palpitations: Secondary | ICD-10-CM | POA: Diagnosis not present

## 2015-10-20 DIAGNOSIS — I340 Nonrheumatic mitral (valve) insufficiency: Secondary | ICD-10-CM | POA: Diagnosis not present

## 2015-10-20 DIAGNOSIS — I358 Other nonrheumatic aortic valve disorders: Secondary | ICD-10-CM | POA: Insufficient documentation

## 2015-10-20 DIAGNOSIS — R0600 Dyspnea, unspecified: Secondary | ICD-10-CM | POA: Diagnosis present

## 2015-12-05 ENCOUNTER — Other Ambulatory Visit: Payer: Self-pay | Admitting: Internal Medicine

## 2015-12-05 DIAGNOSIS — Z1231 Encounter for screening mammogram for malignant neoplasm of breast: Secondary | ICD-10-CM

## 2015-12-23 ENCOUNTER — Ambulatory Visit: Payer: Medicare Other

## 2016-01-06 ENCOUNTER — Ambulatory Visit
Admission: RE | Admit: 2016-01-06 | Discharge: 2016-01-06 | Disposition: A | Discharge status: Home / Self Care | Payer: Medicare Other | Source: Ambulatory Visit | Attending: Internal Medicine | Admitting: Internal Medicine

## 2016-01-06 DIAGNOSIS — Z1231 Encounter for screening mammogram for malignant neoplasm of breast: Secondary | ICD-10-CM

## 2016-04-10 ENCOUNTER — Encounter (HOSPITAL_COMMUNITY): Payer: Self-pay | Admitting: Certified Nurse Midwife

## 2016-04-10 ENCOUNTER — Emergency Department (HOSPITAL_COMMUNITY): Payer: Medicare Other

## 2016-04-10 ENCOUNTER — Emergency Department (HOSPITAL_COMMUNITY)
Admission: EM | Admit: 2016-04-10 | Discharge: 2016-04-10 | Disposition: A | Discharge status: Home / Self Care | Payer: Medicare Other | Attending: Emergency Medicine | Admitting: Emergency Medicine

## 2016-04-10 DIAGNOSIS — Z87891 Personal history of nicotine dependence: Secondary | ICD-10-CM | POA: Insufficient documentation

## 2016-04-10 DIAGNOSIS — I739 Peripheral vascular disease, unspecified: Secondary | ICD-10-CM | POA: Insufficient documentation

## 2016-04-10 DIAGNOSIS — Z7982 Long term (current) use of aspirin: Secondary | ICD-10-CM | POA: Diagnosis not present

## 2016-04-10 DIAGNOSIS — R079 Chest pain, unspecified: Secondary | ICD-10-CM

## 2016-04-10 DIAGNOSIS — I10 Essential (primary) hypertension: Secondary | ICD-10-CM | POA: Insufficient documentation

## 2016-04-10 LAB — CBC
HEMATOCRIT: 36.8 % (ref 36.0–46.0)
Hemoglobin: 12.2 g/dL (ref 12.0–15.0)
MCH: 28 pg (ref 26.0–34.0)
MCHC: 33.2 g/dL (ref 30.0–36.0)
MCV: 84.6 fL (ref 78.0–100.0)
PLATELETS: 321 10*3/uL (ref 150–400)
RBC: 4.35 MIL/uL (ref 3.87–5.11)
RDW: 14.4 % (ref 11.5–15.5)
WBC: 5.8 10*3/uL (ref 4.0–10.5)

## 2016-04-10 LAB — BASIC METABOLIC PANEL
Anion gap: 7 (ref 5–15)
BUN: 13 mg/dL (ref 6–20)
CHLORIDE: 103 mmol/L (ref 101–111)
CO2: 30 mmol/L (ref 22–32)
Calcium: 9.5 mg/dL (ref 8.9–10.3)
Creatinine, Ser: 0.81 mg/dL (ref 0.44–1.00)
Glucose, Bld: 95 mg/dL (ref 65–99)
POTASSIUM: 3.8 mmol/L (ref 3.5–5.1)
Sodium: 140 mmol/L (ref 135–145)

## 2016-04-10 LAB — I-STAT TROPONIN, ED: Troponin i, poc: 0 ng/mL (ref 0.00–0.08)

## 2016-04-10 NOTE — ED Provider Notes (Signed)
[facility]-EMER[facility]ENCY DEPT Provider Note   CSN: [id number] Arrival date & time: 04/10/16  1425     History   Chief Complaint Chief Complaint  Patient presents with  . Chest Pain  . Shortness of Breath  . Leg Pain    HPI Hannah [name] is a 70 y.o. female.  HPI Patient presents with chest pain palpitations shortness of breath and pain in her calf. Has been having chest pain shortness of breath for a while now. States it is in the morning every morning she wakes up with her heart racing. Gradually goes away. Does have occasional left-sided chest pain also. Does not associate with the heart racing. No fevers or chills. No cough. States she does have some shortness of breath with it. Has had previous heart catheterization without coronary artery disease but also has known distal aortic occlusion with collaterals providing flow to her legs. Sees Dr. Allyson SabalBerry. In the last 6 month she has had a cardiac echo. States she had another chest tube does not know what it was. States both were negative. Reviewing notes that shows she was going to get a stress test however I do not see that the stress test is actually been done. States she has been on a monitor in the past for the heart racing and they did not find anything. She has no swelling or legs. States that she gets pain in both calves but worse on the right side. States it previously would be coming on with exertion now is been going on at times with rest.    Past Medical History:  Diagnosis Date  . Family history of heart disease   . Hyperlipidemia   . Hypertension   . 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 Chest pain 08/09/2014  . Peripheral arterial disease (HCC) 06/25/2013  . Essential hypertension 06/25/2013  . Hyperlipidemia 06/25/2013    Past Surgical History:  Procedure Laterality Date  . ABI - BILATERAL  2008   moderate arterial insufficiency at rest; pulsatile flow of bilateral ankle  PVRs  . APPENDECTOMY  11/21/2002   acute appendicitis  . LEFT HEART CATHETERIZATION WITH CORONARY ANGIOGRAM N/A 11/10/2011   Procedure: LEFT HEART CATHETERIZATION WITH CORONARY ANGIOGRAM;  Surgeon: Runell GessJonathan J Berry, MD;  [facility];  Service: Cardiovascular;  Laterality: N/A; - normal left main/LAD/L Cfx/RCA  . NM MYOCAR PERF WALL MOTION  10/2011   lexiscan - EF71%, normal perfusion; low risk scan  . ROTATOR CUFF REPAIR  1995  . TRANSTHORACIC ECHOCARDIOGRAM  10/2011   EF=>55%, calcified moderator band in the RV; borderline LA enlargement; mild MR; mild TR; trace AV regurg & pulm valve regurg    OB History    No data available       Home Medications    Prior to Admission medications   Medication Sig Start Date End Date Taking? Authorizing Provider  aspirin EC 81 MG tablet Take 81 mg by mouth daily.    Historical Provider, MD  Cholecalciferol (VITAMIN D3) 2000 units TABS Take 2,000 Units by mouth daily.    Historical Provider, MD  clopidogrel (PLAVIX) 75 MG tablet Take 75 mg by mouth daily.    Historical Provider, MD  Coenzyme Q-10 200 MG CAPS Take 1 capsule by mouth daily.    Historical Provider, MD  DEXILANT 60 MG capsule Take 60 mg by mouth daily. 08/25/15   Historical Provider, MD  diazepam (VALIUM) 5 MG tablet  Take 0.5 mg by mouth daily. 06/01/13   Historical Provider, MD  hydrochlorothiazide (MICROZIDE) 12.5 MG capsule Take 12.5 mg by mouth daily.    Historical Provider, MD  metoprolol tartrate (LOPRESSOR) 25 MG tablet TAKE 1/2 TABLET BY MOUTH TWICE DAILY 10/12/12   Runell Gess, MD  ramipril (ALTACE) 10 MG capsule Take 10 mg by mouth daily.    Historical Provider, MD  rosuvastatin (CRESTOR) 40 MG tablet Take 40 mg by mouth daily.    Historical Provider, MD    Family History Family History  Problem Relation Age of Onset  . Heart disease Father   . Hypertension Father   . Cancer Father   . Heart attack Father     in 30s  . Hypertension Brother     2 with CABG  .  Stroke Sister   . Hypertension Sister   . Heart Problems Child     born with enlarged heart    Social History Social History  Substance Use Topics  . Smoking status: Former Games developer  . Smokeless tobacco: Never Used  . Alcohol use Yes     Comment: occasional     Allergies   Patient has no known allergies.   Review of Systems Review of Systems  Constitutional: Negative for appetite change.  HENT: Negative for congestion.   Respiratory: Positive for shortness of breath.   Cardiovascular: Positive for chest pain. Negative for leg swelling.  Gastrointestinal: Negative for abdominal pain.  Genitourinary: Negative for dyspareunia and pelvic pain.  Musculoskeletal: Negative for back pain, neck pain and neck stiffness.  Neurological: Negative for numbness.  Hematological: Negative for adenopathy.     Physical Exam Updated Vital Signs BP 146/61   Pulse 80   Temp 97.9 F (36.6 C) (Oral)   Resp 16   Ht 5' 2.5" (1.588 m)   Wt 122 lb (55.3 kg)   SpO2 100%   BMI 21.96 kg/m   Physical Exam  Constitutional: She appears well-developed and well-nourished.  HENT:  Head: Atraumatic.  Eyes: EOM are normal.  Neck: Neck supple. No JVD present.  Cardiovascular: Normal rate.   Pulmonary/Chest: Effort normal. She exhibits no tenderness.  Abdominal: Soft.  Musculoskeletal:  No calf tenderness. Good capillary refill in both feet but somewhat cool. Unable to palpate dorsalis pedis pulse but dopplerable signals on both feet. No skin changes. No edema.  Neurological: She is alert.  Skin: Skin is warm.  Psychiatric: She has a normal mood and affect.     ED Treatments / Results  Labs (all labs ordered are listed, but only abnormal results are displayed) Labs Reviewed  BASIC METABOLIC PANEL  CBC  I-STAT TROPOININ, ED    EKG  EKG Interpretation  Date/Time:  Saturday April 10 2016 14:34:26 EST Ventricular Rate:  86 PR Interval:  138 QRS Duration: 70 QT Interval:  368 QTC  Calculation: 440 R Axis:   56 Text Interpretation:  Normal sinus rhythm Possible Left atrial enlargement Borderline ECG Confirmed by Rubin Payor  MD, Harrold Donath [phone]) on 04/10/2016 5:14:42 PM       Radiology Dg Chest 2 View  Result Date: 04/10/2016 CLINICAL DATA:  Shortness of breath, productive cough and left-sided chest pain. EXAM: CHEST  2 VIEW COMPARISON:  08/23/2014 FINDINGS: The heart size is stable and normal. Stable mild tortuosity of the thoracic aorta. There is no evidence of pulmonary edema, consolidation, pneumothorax, nodule or pleural fluid. Stable mild thoracic scoliosis. IMPRESSION: No active cardiopulmonary disease. Electronically Signed   By: Sherrine Maples  Fredia SorrowYamagata M.D.   On: 04/10/2016 15:08    Procedures Procedures (including critical care time)  Medications Ordered in ED Medications - No data to display   Initial Impression / Assessment and Plan / ED Course  I have reviewed the triage vital signs and the nursing notes.  Pertinent labs & imaging results that were available during my care of the patient were reviewed by me and considered in my medical decision making (see chart for details).  Clinical Course   Patient with chest pain. Has had for a while and EKG and lab work with x-ray reassuring. Previous reassuring cath and echo. Has a cardiologist. With her leg pain) now claudication. States it is worsening somewhat. Dr. Gery PrayBarry also does peripheral arterial disease. Will follow-up with him. I think this is not a DVT.  Final Clinical Impressions(s) / ED Diagnoses   Final diagnoses:  Claudication [facility](HCC)  Chest pain, unspecified type    New Prescriptions New Prescriptions   No medications on file     Benjiman CoreNathan Charene Mccallister, MD 04/10/16 1800

## 2016-04-10 NOTE — ED Triage Notes (Signed)
Pt states she has leg pain since yesterday and ongoing chest pain and SOB. Pt comes today for the leg pain in calf.

## 2016-04-19 ENCOUNTER — Ambulatory Visit: Payer: Medicare Other | Admitting: Physician Assistant

## 2016-04-19 NOTE — Progress Notes (Deleted)
Cardiology Office Note   Date:  04/19/2016   ID:  Hannah [name], Hannah [name] 06-28-1945, MRN [id number]  PCP:  Ralene Ok, MD  Cardiologist:  Dr Allyson Sabal 09/17/2015 Theodore Demark, PA-C   No chief complaint on file.   History of Present Illness: Hannah [name] is a 71 y.o. female with a history of nl cors cath 2013 w/ Aorta 100% below renals ++collaterals, remote tob use, HTN, HLD, FH CAD.  09/2015, Dr Allyson Sabal eval for CP/SOB, palpitations. Echo ok. MV ordered, not done. ER visit 12/30 for CP/SOB, palpitations  Hannah [name] presents for ***   Past Medical History:  Diagnosis Date  . Family history of heart disease   . Hyperlipidemia   . Hypertension   . 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 ABI - BILATERAL  2008   moderate arterial insufficiency at rest; pulsatile flow of bilateral ankle PVRs  . APPENDECTOMY  11/21/2002   acute appendicitis  . LEFT HEART CATHETERIZATION WITH CORONARY ANGIOGRAM N/A 11/10/2011   Procedure: LEFT HEART CATHETERIZATION WITH CORONARY ANGIOGRAM;  Surgeon: Runell Gess, MD;  Location: [facility] CATH LAB;  Service: Cardiovascular;  Laterality: N/A; - normal left main/LAD/L Cfx/RCA  . NM MYOCAR PERF WALL MOTION  10/2011   lexiscan - EF71%, normal perfusion; low risk scan  . ROTATOR CUFF REPAIR  1995  . TRANSTHORACIC ECHOCARDIOGRAM  10/2011   EF=>55%, calcified moderator band in the RV; borderline LA enlargement; mild MR; mild TR; trace AV regurg & pulm valve regurg    Current Outpatient Prescriptions  Medication Sig Dispense Refill  . aspirin EC 81 MG tablet Take 81 mg by mouth daily.    . Cholecalciferol (VITAMIN D3) 2000 units TABS Take 2,000 Units by mouth daily.    . clopidogrel (PLAVIX) 75 MG tablet Take 75 mg by mouth daily.    . Coenzyme Q-10 200 MG CAPS Take 1 capsule by mouth daily.    Marland Kitchen DEXILANT 60 MG capsule Take 60 mg by mouth daily.  0  . diazepam (VALIUM) 5 MG tablet Take 0.5 mg by mouth daily.     . hydrochlorothiazide (MICROZIDE) 12.5 MG capsule Take 12.5 mg by mouth daily.    . metoprolol tartrate (LOPRESSOR) 25 MG tablet TAKE 1/2 TABLET BY MOUTH TWICE DAILY 30 tablet 7  . ramipril (ALTACE) 10 MG capsule Take 10 mg by mouth daily.    . rosuvastatin (CRESTOR) 40 MG tablet Take 40 mg by mouth daily.     No current facility-administered medications for this visit.     Allergies:   Patient has no known allergies.    Social History:  The patient  reports that she has quit smoking. She has never used smokeless tobacco. She reports that she drinks alcohol. She reports that she does not use drugs.   Family History:  The patient's family history includes Cancer in her father; Heart Problems in her child; Heart attack in her father; Heart disease in her father; Hypertension in her brother, father, and sister; Stroke in her sister.    ROS:  Please see the history of present illness. All other systems are reviewed and negative.    PHYSICAL EXAM: VS:  There were no vitals taken for this visit. , BMI There is no height or weight on file to calculate BMI. GEN: Well nourished, well developed, female in no acute distress  HEENT: normal for age  Neck: no JVD, no carotid bruit, no  masses Cardiac: RRR; no murmur, no rubs, or gallops Respiratory:  clear to auscultation bilaterally, normal work of breathing GI: soft, nontender, nondistended, + BS MS: no deformity or atrophy; no edema; distal pulses are 2+ in all 4 extremities   Skin: warm and dry, no rash Neuro:  Strength and sensation are intact Psych: euthymic mood, full affect   EKG:  EKG {ACTION; IS/IS [id number]} ordered today. The ekg ordered today demonstrates ***  ECHO: 10/20/2015 - Left ventricle: The cavity size was normal. Systolic function was   normal. The estimated ejection fraction was in the range of 55%   to 60%. Wall motion was normal; there were no regional wall   motion abnormalities. Features are consistent with a  pseudonormal   left ventricular filling pattern, with concomitant abnormal   relaxation and increased filling pressure (grade 2 diastolic   dysfunction). - Aortic valve: Trileaflet; mildly thickened, mildly calcified   leaflets. - Mitral valve: There was mild regurgitation.   Recent Labs: 04/10/2016: BUN 13; Creatinine, Ser 0.81; Hemoglobin 12.2; Platelets 321; Potassium 3.8; Sodium 140    Lipid Panel No results found for: CHOL, TRIG, HDL, CHOLHDL, VLDL, LDLCALC, LDLDIRECT   Wt Readings from Last 3 Encounters:  04/10/16 122 lb (55.3 kg)  09/17/15 121 lb (54.9 kg)  08/09/14 122 lb 9.6 oz (55.6 kg)     Other studies Reviewed: Additional studies/ records that were reviewed today include: ***.  ASSESSMENT AND PLAN:  1.  ***   Current medicines are reviewed at length with the patient today.  The patient {ACTIONS; HAS/DOES NOT HAVE:19233} concerns regarding medicines.  The following changes have been made:  {PLAN; NO CHANGE:13088:s}  Labs/ tests ordered today include: *** No orders of the defined types were placed in this encounter.    Disposition:   FU with ***  Signed, Theodore DemarkBarrett, Crystin Lechtenberg, PA-C  04/19/2016 9:10 AM    [facility] Phone: [phone]([phone]; Fax: [phone]) [phone]  This note was written with the assistance of speech recognition software. Please excuse any transcriptional errors.

## 2016-04-26 ENCOUNTER — Ambulatory Visit (INDEPENDENT_AMBULATORY_CARE_PROVIDER_SITE_OTHER): Payer: Medicare Other | Admitting: Physician Assistant

## 2016-04-26 ENCOUNTER — Encounter: Payer: Self-pay | Admitting: Physician Assistant

## 2016-04-26 VITALS — BP 143/84 | HR 94 | Ht 62.5 in | Wt 125.8 lb

## 2016-04-26 DIAGNOSIS — I739 Peripheral vascular disease, unspecified: Secondary | ICD-10-CM | POA: Diagnosis not present

## 2016-04-26 DIAGNOSIS — I10 Essential (primary) hypertension: Secondary | ICD-10-CM | POA: Diagnosis not present

## 2016-04-26 MED ORDER — METOPROLOL TARTRATE 25 MG PO TABS: 25.0000 mg | ORAL_TABLET | Freq: Two times a day (BID) | ORAL | 11 refills | Status: DC

## 2016-04-26 NOTE — Patient Instructions (Signed)
Medication Instructions: Theodore DemarkRhonda Barrett, PA-C recommends that you continue on your current medications as directed. Please refer to the Current Medication list given to you today.  Labwork: NONE ORDERED  Testing/Procedures: 1. Lower Extremity Arterial Dopplers - Your physician has requested that you have a lower or upper extremity arterial duplex. This test is an ultrasound of the arteries in the legs or arms. It looks at arterial blood flow in the legs and arms. Allow one hour for Lower and Upper Arterial scans. There are no restrictions or special instructions.  Follow-up: Bjorn LoserRhonda recommends that you schedule a follow-up appointment with Dr Allyson SabalBerry after your test.  If you need a refill on your cardiac medications before your next appointment, please call your pharmacy.

## 2016-04-26 NOTE — Progress Notes (Signed)
Cardiology Office Note   Date:  04/26/2016   ID:  Hannah [name], DOB 10-08-1945, MRN [id number]  PCP:  Ralene Ok, MD  Cardiologist:  Dr. Allyson Sabal 09/17/2015  Theodore Demark, PA-C   Chief Complaint  Patient presents with  . Follow-up  . Shortness of Breath    constantly.  . Dizziness    randomly.  . Leg Pain    when doing alot of activity    History of Present Illness: Hannah [name] is a 71 y.o. female with a history of HTN, HLD, Cath w/ no CAD 2013, PAD w/ distal Ao occlusion>reconstitution of her iliac arteries via lumbar and IMA collaterals, palpitations with rare PVCs on event monitor  12/30 ER visit for claudication symptoms   Hannah [name] presents for cardiology evaluation and follow up.  Hannah [name] has chronic claudication symptoms with predictable amounts of exertion. She has been able to increase her exertion successfully but has occasional bouts of symptoms at rest.   She had one of these 12/30. It was the worst it had been in > 5 years. She could not put weight on her R leg at the time. She went to the ER because she was concerned about it being a DVT. She was not having significant swelling, maybe a little. Dopplers were negative for DVT. Her leg has gradually improved, she is now back to her baseline. She would like to be more active, but has not been pushing herself recently.   She sleeps poorly, wakes up every few hours. She never feels rested. She has rapid palpitations frequently when she wakes. She has frightening dreams and feels startled awake. Her heart will be beating fast, but it feels regular. She remembers that her monitor did not show anything, is ok with not wearing another one.   She was supposed to get a stress test but has not. She had side effects with the Lexiscan used at the last one and wants to avoid the [place].  All of her siblings have had PAD, 2 brothers required bypass. She does not want surgery.   Past Medical History:    Diagnosis Date  . Family history of heart disease   . Hyperlipidemia   . Hypertension   . 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 ABI - BILATERAL  2008   moderate arterial insufficiency at rest; pulsatile flow of bilateral ankle PVRs  . APPENDECTOMY  11/21/2002   acute appendicitis  . LEFT HEART CATHETERIZATION WITH CORONARY ANGIOGRAM N/A 11/10/2011   Procedure: LEFT HEART CATHETERIZATION WITH CORONARY ANGIOGRAM;  Surgeon: Runell Gess, MD;  [facility] CATH LAB;  Service: Cardiovascular;  Laterality: N/A; - normal left main/LAD/L Cfx/RCA  . NM MYOCAR PERF WALL MOTION  10/2011   lexiscan - EF71%, normal perfusion; low risk scan  . ROTATOR CUFF REPAIR  1995  . TRANSTHORACIC ECHOCARDIOGRAM  10/2011   EF=>55%, calcified moderator band in the RV; borderline LA enlargement; mild MR; mild TR; trace AV regurg & pulm valve regurg    Current Outpatient Prescriptions  Medication Sig Dispense Refill  . aspirin EC 81 MG tablet Take 81 mg by mouth daily.    . Cholecalciferol (VITAMIN D3) 2000 units TABS Take 2,000 Units by mouth daily.    . clopidogrel (PLAVIX) 75 MG tablet Take 75 mg by mouth daily.    . Coenzyme Q-10 200 MG CAPS Take 1 capsule by mouth daily.    Marland Kitchen  DEXILANT 60 MG capsule Take 60 mg by mouth daily.  0  . diazepam (VALIUM) 5 MG tablet Take 0.5 mg by mouth daily.    . hydrochlorothiazide (MICROZIDE) 12.5 MG capsule Take 12.5 mg by mouth daily.    . metoprolol tartrate (LOPRESSOR) 25 MG tablet TAKE 1/2 TABLET BY MOUTH TWICE DAILY 30 tablet 7  . ramipril (ALTACE) 10 MG capsule Take 10 mg by mouth daily.    . rosuvastatin (CRESTOR) 40 MG tablet Take 40 mg by mouth daily.     No current facility-administered medications for this visit.     Allergies:   Patient has no known allergies.    Social History:  The patient  reports that she has quit smoking. She has never used smokeless tobacco. She reports that she drinks alcohol. She reports  that she does not use drugs.   Family History:  The patient's family history includes Cancer in her father; Heart Problems in her child; Heart attack in her father; Heart disease in her father; Hypertension in her brother, father, and sister; Stroke in her sister.    ROS:  Please see the history of present illness. All other systems are reviewed and negative.    PHYSICAL EXAM: VS:  BP (!) 143/84   Pulse 94   Ht 5' 2.5" (1.588 m)   Wt 125 lb 12.8 oz (57.1 kg)   BMI 22.64 kg/m  , BMI Body mass index is 22.64 kg/m. GEN: Well nourished, well developed, female in no acute distress  HEENT: normal for age  Neck: no JVD, no carotid bruit, no masses Cardiac: RRR; no murmur, no rubs, or gallops Respiratory:  clear to auscultation bilaterally, normal work of breathing GI: soft, nontender, nondistended, + BS Hannah: no deformity or atrophy; no edema; distal pulses are 2+ in all 4 extremities   Skin: warm and dry, no rash Neuro:  Strength and sensation are intact Psych: euthymic mood, full affect   EKG:  EKG is not ordered today.  ECHO: 10/20/2015 - Left ventricle: The cavity size was normal. Systolic function was   normal. The estimated ejection fraction was in the range of 55%   to 60%. Wall motion was normal; there were no regional wall   motion abnormalities. Features are consistent with a pseudonormal   left ventricular filling pattern, with concomitant abnormal   relaxation and increased filling pressure (grade 2 diastolic   dysfunction). - Aortic valve: Trileaflet; mildly thickened, mildly calcified   leaflets. - Mitral valve: There was mild regurgitation.   Recent Labs: 04/10/2016: BUN 13; Creatinine, Ser 0.81; Hemoglobin 12.2; Platelets 321; Potassium 3.8; Sodium 140    Lipid Panel No results found for: CHOL, TRIG, HDL, CHOLHDL, VLDL, LDLCALC, LDLDIRECT   Wt Readings from Last 3 Encounters:  04/26/16 125 lb 12.8 oz (57.1 kg)  04/10/16 122 lb (55.3 kg)  09/17/15 121 lb  (54.9 kg)     Other studies Reviewed: Additional studies/ records that were reviewed today include: Office notes, hospital records and testing.  ASSESSMENT AND PLAN:  1.  PAD: It is unclear what causes her flares. In general, she takes very good care of herself and is compliant with medications. She has been successful at increasing her activity level in the past. We will check ABIs and lower extremity arterial Dopplers. She will follow-up with Dr. Allyson Sabal after that. She is on Plavix, I do not believe Pletal will help. Continue to increase activity as tolerated.  2. Hypertension: Her blood pressure and heart rate  are both elevated today. She is on a beta blocker, but the dose is low. We will increase her beta blocker and follow her blood pressure.  3. Palpitations: She has been having problems with waking up with a rapid heart. She has worn an event monitor in the past and it only showed sinus tachycardia. I believe there is an anxiety component and we discussed this.   4. Anxiety: She is waking up on a regular basis with a rapid heartbeat from bad dreams. When she discusses this, there seems to be a component of underlying anxiety as well. She is encouraged to talk to her family physician about issues with anxiety or use different methods of biofeedback to calm herself down and keep her self relaxed. Follow-up with her PCP if this does not help.   Current medicines are reviewed at length with the patient today.  The patient does not have concerns regarding medicines.  The following changes have been made:  Increase metoprolol  Labs/ tests ordered today include:   ABIs   Disposition:   FU with Dr. Allyson SabalBerry  Signed, Theodore DemarkBarrett, Brylea Pita, PA-C  04/26/2016 5:28 PM    [facility] Phone: [phone](336) [phone]; Fax: [phone](336) [phone]  This note was written with the assistance of speech recognition software. Please excuse any transcriptional errors.

## 2016-05-11 ENCOUNTER — Ambulatory Visit: Payer: Medicare Other | Admitting: Cardiovascular Disease

## 2016-05-17 ENCOUNTER — Other Ambulatory Visit: Payer: Self-pay | Admitting: Physician Assistant

## 2016-05-17 ENCOUNTER — Other Ambulatory Visit: Payer: Self-pay | Admitting: Cardiovascular Disease

## 2016-05-17 DIAGNOSIS — I739 Peripheral vascular disease, unspecified: Secondary | ICD-10-CM

## 2016-05-20 ENCOUNTER — Ambulatory Visit (HOSPITAL_COMMUNITY)
Admission: RE | Admit: 2016-05-20 | Discharge: 2016-05-20 | Disposition: A | Discharge status: Home / Self Care | Payer: Medicare Other | Source: Ambulatory Visit | Attending: Cardiology | Admitting: Cardiology

## 2016-05-20 DIAGNOSIS — I771 Stricture of artery: Secondary | ICD-10-CM | POA: Diagnosis not present

## 2016-05-20 DIAGNOSIS — I739 Peripheral vascular disease, unspecified: Secondary | ICD-10-CM | POA: Diagnosis present

## 2016-05-20 DIAGNOSIS — I7409 Other arterial embolism and thrombosis of abdominal aorta: Secondary | ICD-10-CM | POA: Insufficient documentation

## 2016-05-25 ENCOUNTER — Other Ambulatory Visit: Payer: Self-pay | Admitting: Cardiovascular Disease

## 2016-05-25 DIAGNOSIS — I739 Peripheral vascular disease, unspecified: Secondary | ICD-10-CM

## 2016-06-01 ENCOUNTER — Encounter: Payer: Self-pay | Admitting: Neurology

## 2016-06-01 ENCOUNTER — Ambulatory Visit (INDEPENDENT_AMBULATORY_CARE_PROVIDER_SITE_OTHER): Payer: Medicare Other | Admitting: Neurology

## 2016-06-01 VITALS — BP 125/73 | HR 84 | Resp 20 | Ht 62.5 in | Wt 128.0 lb

## 2016-06-01 DIAGNOSIS — F515 Nightmare disorder: Secondary | ICD-10-CM | POA: Diagnosis not present

## 2016-06-01 DIAGNOSIS — G4750 Parasomnia, unspecified: Secondary | ICD-10-CM | POA: Diagnosis not present

## 2016-06-01 DIAGNOSIS — G2581 Restless legs syndrome: Secondary | ICD-10-CM

## 2016-06-01 DIAGNOSIS — R0683 Snoring: Secondary | ICD-10-CM

## 2016-06-01 DIAGNOSIS — R519 Headache, unspecified: Secondary | ICD-10-CM

## 2016-06-01 DIAGNOSIS — R351 Nocturia: Secondary | ICD-10-CM

## 2016-06-01 DIAGNOSIS — G4752 REM sleep behavior disorder: Secondary | ICD-10-CM | POA: Diagnosis not present

## 2016-06-01 DIAGNOSIS — R51 Headache: Secondary | ICD-10-CM

## 2016-06-01 NOTE — Patient Instructions (Addendum)
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 This means, that you have a tendency to act out your dreams in your sleep. The frequency of this problem is highly variable and may range from night to night episodes to going days and weeks without any problems. This condition may result in sleep disruption, and therefore daytime symptoms such as lack of energy, problems focusing and concentrating and daytime sleepiness. It may result in self injury if you flail your arms and legs and even roll out of bed but also injury to your bed partner if you accidentally grab, or punch or hit your bed partner. Making your sleep environment safe for you and your bed partner is important. We do not actually know what causes this condition. It can be at times linked to either neurological conditions, including neuro degenerative diseases such as Parkinson's disease (PD). We do not understand fully how RBD and Parkinson's disease or related and why RBD may occur at times many years before Parkinson's disease develops and some cases. Having RBD certainly does not mean he will go on to develop Parkinson's disease. Nevertheless, there may be up to 40% correlation between RBD and PD.   I think we need to proceed with a sleep study to further delineate your sleep problems.  We will do a brain scan, called MRI and call you with the test results. We will have to schedule you for this on a separate date. This test requires authorization from your insurance, and we will take care of the insurance process. Stay well hydrated with water and reduce your red wine intake, do not use alcohol or wine as a sleep aid.   Try to strive for an earlier bedtime than 2 AM.
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
## 2016-06-08 ENCOUNTER — Ambulatory Visit (INDEPENDENT_AMBULATORY_CARE_PROVIDER_SITE_OTHER): Payer: Medicare Other | Admitting: Cardiovascular Disease

## 2016-06-08 ENCOUNTER — Encounter: Payer: Self-pay | Admitting: Cardiovascular Disease

## 2016-06-08 DIAGNOSIS — R002 Palpitations: Secondary | ICD-10-CM

## 2016-06-08 DIAGNOSIS — I10 Essential (primary) hypertension: Secondary | ICD-10-CM

## 2016-06-08 DIAGNOSIS — I739 Peripheral vascular disease, unspecified: Secondary | ICD-10-CM

## 2016-06-08 DIAGNOSIS — E7800 Pure hypercholesterolemia, unspecified: Secondary | ICD-10-CM

## 2016-06-08 HISTORY — DX: Palpitations: R00.2

## 2016-06-08 NOTE — Assessment & Plan Note (Signed)
History of palpitations which she gets in the mornings on occasion. She has had an event monitor in the past which was unrevealing. At this point, she does not wish this to be evaluated at this time unless her symptoms get more frequent or worse.

## 2016-06-08 NOTE — Patient Instructions (Signed)
Medication Instructions: Your physician recommends that you continue on your current medications as directed. Please refer to the Current Medication list given to you today.  Follow-Up: We request that you follow-up in: 6 months with Rhonda Barrett, PA-C and in 12 months with Dr Berry  You will receive a reminder letter in the mail two months in advance. If you don't receive a letter, please call our office to schedule the follow-up appointment.  If you need a refill on your cardiac medications before your next appointment, please call your pharmacy.  

## 2016-06-08 NOTE — Progress Notes (Signed)
06/08/2016 Hannah [name]   12/31/1945  [id number]  Primary Physician Ralene OkMOREIRA,ROY, MD Primary Cardiologist: Runell GessJonathan J Ishaq Maffei MD Roseanne RenoFACP, FACC, FAHA, FSCAI  HPI:  She is a 71 year old thin-appearing married African American female, mother of 1 child, who I last saw 09/17/15.Marland Kitchen. She has a history of normal coronary arteries by catheterization, which I performed November 10, 2011. 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 She really denies significant lifestyle-limiting claudication. Her other problems include remote tobacco abuse, having quit over 20 years ago, treated hypertension, hyperlipidemia, as well as a strong family history for heart disease with a father who had an MI in his 4030s and 2 brothers who have had bypass surgery. Since I saw her a year ago , she did have some cramping in her right calf however venous Dopplers were negative for DVT. 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 She ALSO gets occasional morning palpitations and has worn an event monitor in the past which was unrevealing.She is Scheduled for an outpatient sleep study in the near future.   Current Outpatient Prescriptions  Medication Sig Dispense Refill  . aspirin EC 81 MG tablet Take 81 mg by mouth daily.    . clopidogrel (PLAVIX) 75 MG tablet Take 75 mg by mouth daily.    . Coenzyme Q-10 200 MG CAPS Take 1 capsule by mouth daily.    Marland Kitchen. DEXILANT 60 MG capsule Take 60 mg by mouth daily.  0  . diazepam (VALIUM) 5 MG tablet Take 2.5 mg by mouth daily.     . hydrochlorothiazide (MICROZIDE) 12.5 MG capsule Take 12.5 mg by mouth daily.    . metoprolol tartrate (LOPRESSOR) 25 MG tablet Take 1 tablet (25 mg total) by mouth 2 (two) times daily. 30 tablet 11  . ramipril (ALTACE) 10 MG capsule Take 10  mg by mouth daily.    . rosuvastatin (CRESTOR) 40 MG tablet Take 40 mg by mouth daily.     No current facility-administered medications for this visit.     No Known Allergies  Social History   Social History  . Marital status: Married    Spouse name: N/A  . Number of children: 1  . Years of education: 7212   Occupational History  . Not on file.   Social History Main Topics  . Smoking status: Former Games developermoker  . Smokeless tobacco: Never Used  . Alcohol use Yes     Comment: occasional  . Drug use: No  . Sexual activity: Not on file   Other Topics Concern  . Not on file   Social History Narrative  . No narrative on file     Review of Systems: General: negative for chills, fever, night sweats or weight changes.  Cardiovascular: negative for chest pain, dyspnea on exertion, edema, orthopnea, palpitations, paroxysmal nocturnal dyspnea or shortness of breath Dermatological: negative for rash Respiratory: negative for cough or wheezing Urologic: negative for hematuria Abdominal: negative for nausea, vomiting, diarrhea, bright red blood per rectum, melena, or hematemesis Neurologic: negative for visual changes, syncope, or dizziness All other systems reviewed and are otherwise negative except as noted above.    Blood pressure 127/73, pulse 82, height 5' 2.5" (1.588 m), weight 128 lb 3.2 oz (58.2 kg).  General appearance: alert and no distress Neck: no adenopathy, no  carotid bruit, no JVD, supple, symmetrical, trachea midline and thyroid not enlarged, symmetric, no tenderness/mass/nodules Lungs: clear to auscultation bilaterally Heart: regular rate and rhythm, S1, S2 normal, no murmur, click, rub or gallop Extremities: extremities normal, atraumatic, no cyanosis or edema  EKG not performed today  ASSESSMENT AND PLAN:   Peripheral arterial disease [name] returns today for follow-up. She does have PAD with "Leriche syndrome which I documented angiographically in 2013. She  has minimal lifestyle limiting claudication and does not wish this surgically addressed at this time.  Essential hypertension History of hypertension blood pressure measures 127/73. She is on hydrochlorothiazide, ramipril and metoprolol. Continue current meds at current dosing  Hyperlipidemia History of hyperlipidemia on statin therapy followed by her PCP. Her most recent lipid profile performed by her PCP 05/07/16 revealed a total cholesterol of 167 and LDL of 90 and an HDL of 66.  Palpitations History of palpitations which she gets in the mornings on occasion. She has had an event monitor in the past which was unrevealing. At this point, she does not wish this to be evaluated at this time unless her symptoms get more frequent or worse.      Runell Gess MD FACP,FACC,FAHA, [facility] 06/08/2016 12:12 PM

## 2016-06-08 NOTE — Assessment & Plan Note (Signed)
Hannah [name] returns today for follow-up. She does have PAD with "Leriche syndrome which I documented angiographically in 2013. She has minimal lifestyle limiting claudication and does not wish this surgically addressed at this time.

## 2016-06-08 NOTE — Assessment & Plan Note (Signed)
History of hypertension blood pressure measures 127/73. She is on hydrochlorothiazide, ramipril and metoprolol. Continue current meds at current dosing

## 2016-06-08 NOTE — Assessment & Plan Note (Addendum)
History of hyperlipidemia on statin therapy followed by her PCP. Her most recent lipid profile performed by her PCP 05/07/16 revealed a total cholesterol of 167 and LDL of 90 and an HDL of 66.

## 2016-06-11 ENCOUNTER — Ambulatory Visit (INDEPENDENT_AMBULATORY_CARE_PROVIDER_SITE_OTHER): Payer: Medicare Other | Admitting: Neurology

## 2016-06-11 DIAGNOSIS — G4761 Periodic limb movement disorder: Secondary | ICD-10-CM | POA: Diagnosis not present

## 2016-06-11 DIAGNOSIS — R0683 Snoring: Secondary | ICD-10-CM

## 2016-06-11 DIAGNOSIS — G4720 Circadian rhythm sleep disorder, unspecified type: Secondary | ICD-10-CM

## 2016-06-15 NOTE — Progress Notes (Signed)
Patient referred by Dr. Ludwig ClarksMoreira, seen by me on 06/01/16, diagnostic PSG on 06/11/16.   Please call and notify the patient that the recent sleep study did not show any significant obstructive sleep apnea. She had severe leg movements in sleep, which we call PLMs; this is often associated with symptoms of RLS. Please inform patient that I would like to go over the details of the study during a follow up appointment. Arrange a followup appointment. Also, route or fax report to PCP and referring MD, if other than PCP.  Once you have spoken to patient, you can close this encounter.   Thanks,  Huston FoleySaima Lucillie Kiesel, MD, PhD [facility]

## 2016-06-15 NOTE — Procedures (Signed)
PATIENT'S NAME:  Hannah [name], Hannah [name] DOB:      May 25, 1945      MR#:    [id number]     DATE OF RECORDING: 06/11/2016 REFERRING M.D.:  Ralene Ok, MD Study Performed:   Baseline Polysomnogram HISTORY: 71 year old woman with a history of hypertension, hyperlipidemia, peripheral artery disease with chronic claudication, who reports episodes of sleepwalking and dream enactments. She also has sleep disruption, she reports waking up with a panic and palpitation, vivid dreams and nightmares. The patient endorsed the Epworth Sleepiness Scale at 1 points. The patient's weight 128 pounds with a height of 62 (inches), resulting in a BMI of 22.9 kg/m2. The patient's neck circumference measured 13 inches.  CURRENT MEDICATIONS: Aspirin, Plavix, CoQ-10, Dexilant, Valium, HCTZ, Lopressor, Altace, Crestor   PROCEDURE:  This is a multichannel digital polysomnogram utilizing the Somnostar 11.2 system.  Electrodes and sensors were applied and monitored per AASM Specifications.   EEG, EOG, Chin and Limb EMG, were sampled at 200 Hz.  ECG, Snore and Nasal Pressure, Thermal Airflow, Respiratory Effort, CPAP Flow and Pressure, Oximetry was sampled at 50 Hz. Digital video and audio were recorded.      BASELINE STUDY  Lights Out was at 22:05 and Lights On at 04:59.  Total recording time (TRT) was 414.5 minutes, with a total sleep time (TST) of  72.5 minutes.   The patient's sleep latency was 294 minutes, which is markedly prolonged.  Patient took valium around 2 AM. REM latency was 57.5 minutes.  The sleep efficiency was 17.5%, which is severely reduced.     SLEEP ARCHITECTURE: WASO (Wake after sleep onset) was 42.5 minutes with inability to go back to sleep after 04:17 AM. There were 2.5 minutes in Stage N1, 56 minutes Stage N2, 0 minutes Stage N3 and 14 minutes in Stage REM.  The percentage of Stage N1 was 3.4%, Stage N2 was 77.2%, which was increased, Stage N3 was absent and Stage R (REM sleep) was 19.3%.   The arousals were  noted as: 7 were spontaneous, 1 were associated with PLMs, 0 were associated with respiratory events.    Audio and video analysis did not show any abnormal or unusual movements, behaviors, phonations or vocalizations.  The patient took 1 bathroom break. Mild snoring was noted.  EKG was in keeping with normal sinus rhythm (NSR).  RESPIRATORY ANALYSIS:  There were a total of 1 respiratory events:  0 obstructive apneas, 0 central apneas and 0 mixed apneas with a total of 0 apneas and an apnea index (AI) of 0 /hour. There were 1 hypopneas with a hypopnea index of .8 /hour. The patient also had 0 respiratory event related arousals (RERAs).      The total APNEA/HYPOPNEA INDEX (AHI) was .8/hour and the total RESPIRATORY DISTURBANCE INDEX was .8 /hour.  0 events occurred in REM sleep and 2 events in NREM. The REM AHI was 0 /hour, versus a non-REM AHI of 1.. The patient spent 72.5 minutes of total sleep time in the supine position and 0 minutes in non-supine.. The supine AHI was 0.8 versus a non-supine AHI of 0.0.  OXYGEN SATURATION & C02:  The Wake baseline 02 saturation was 99%, with the lowest being 90%. Time spent below 89% saturation equaled 0 minutes.  PERIODIC LIMB MOVEMENTS:  The patient had a total of 130 Periodic Limb Movements.  The Periodic Limb Movement (PLM) index was 107.6 and the PLM Arousal index was .8/hour.    Post-study, the patient indicated that sleep was worse than  usual.   IMPRESSION:  1. Periodic Limb Movement Disorder (PLMD) 2. Primary Snoring 3. Dysfunctions associated with sleep stages or arousal from sleep  RECOMMENDATIONS:  1. Severe PLMs (periodic limb movements of sleep) were noted during this study without significant arousals; clinical correlation is recommended.  2. This study does not demonstrate any significant obstructive or central sleep disordered breathing. There was mild snoring.  3. This study shows poor sleep efficiency with significant delay in sleep onset,  sleep fragmentation and abnormal sleep stage percentages; these are nonspecific findings and per se do not signify an intrinsic sleep disorder or a cause for the patient's sleep-related symptoms. Causes include (but are not limited to) the first night effect of the sleep study, circadian rhythm disturbances, medication effect or an underlying mood disorder or medical problem.  4. The patient should be cautioned not to drive, work at heights, or operate dangerous or heavy equipment when tired or sleepy. Review and reiteration of good sleep hygiene measures should be pursued with any patient. 5. The patient will be [facility] of the test results and further management strategies. The referring provider will be notified of the test results.    I certify that I have reviewed the entire raw data recording prior to the issuance of this report in accordance with the Standards of Accreditation of the American Academy of Sleep Medicine (AASM)     Huston FoleySaima Aeden Matranga, MD, PhD Diplomat, American Board of Psychiatry and Neurology (Neurology and Sleep Medicine)

## 2016-06-18 ENCOUNTER — Telehealth: Payer: Self-pay

## 2016-06-18 NOTE — Telephone Encounter (Signed)
-----   Message from Huston FoleySaima Athar, MD sent at 06/15/2016  8:11 AM EST ----- Patient referred by Dr. Ludwig ClarksMoreira, seen by me on 06/01/16, diagnostic PSG on 06/11/16.   Please call and notify the patient that the recent sleep study did not show any significant obstructive sleep apnea. She had severe leg movements in sleep, which we call PLMs; this is often associated with symptoms of RLS. Please inform patient that I would like to go over the details of the study during a follow up appointment. Arrange a followup appointment. Also, route or fax report to PCP and referring MD, if other than PCP.  Once you have spoken to patient, you can close this encounter.   Thanks,  Huston FoleySaima Athar, MD, PhD [facility])

## 2016-06-18 NOTE — Telephone Encounter (Signed)
Pt returned RN's call °

## 2016-06-18 NOTE — Telephone Encounter (Signed)
LM on cell to call back.   No answer and no vm at home number

## 2016-06-18 NOTE — Telephone Encounter (Signed)
I spoke to patient and she is aware of results and recommendations. She would like to wait and make an appt after her MRI.

## 2016-06-22 ENCOUNTER — Ambulatory Visit
Admission: RE | Admit: 2016-06-22 | Discharge: 2016-06-22 | Disposition: A | Discharge status: Home / Self Care | Payer: Medicare Other | Source: Ambulatory Visit | Attending: Neurology | Admitting: Neurology

## 2016-06-22 DIAGNOSIS — G2581 Restless legs syndrome: Secondary | ICD-10-CM

## 2016-06-22 DIAGNOSIS — R0683 Snoring: Secondary | ICD-10-CM

## 2016-06-22 DIAGNOSIS — R351 Nocturia: Secondary | ICD-10-CM

## 2016-06-22 DIAGNOSIS — R519 Headache, unspecified: Secondary | ICD-10-CM

## 2016-06-22 DIAGNOSIS — G4750 Parasomnia, unspecified: Secondary | ICD-10-CM

## 2016-06-22 DIAGNOSIS — F515 Nightmare disorder: Secondary | ICD-10-CM

## 2016-06-22 DIAGNOSIS — R51 Headache: Secondary | ICD-10-CM

## 2016-06-22 DIAGNOSIS — G4752 REM sleep behavior disorder: Secondary | ICD-10-CM

## 2016-06-22 MED ORDER — GADOBENATE DIMEGLUMINE 529 MG/ML IV SOLN
10.0000 mL | Freq: Once | INTRAVENOUS | Status: AC | PRN
  Administered 2016-06-22: 10 mL via INTRAVENOUS

## 2016-06-24 ENCOUNTER — Telehealth: Payer: Self-pay

## 2016-06-24 NOTE — Telephone Encounter (Signed)
-----   Message from Huston FoleySaima Athar, MD sent at 06/24/2016  5:11 PM EDT ----- Please call patient regarding the recent brain MRI: The brain scan showed a normal structure of the brain and mild volume loss which we call atrophy. There were changes in the deeper structures of the brain, which we call white matter changes or microvascular changes. These were reported as mild in Her case. These are tiny white spots, that occur with time and are seen in a variety of conditions, including with normal aging, chronic hypertension, chronic headaches, especially migraine HAs, chronic diabetes, chronic hyperlipidemia. These are not strokes and no mass or lesion or contrast enhancement was seen which is reassuring. Again, there were no acute findings, such as a stroke, or mass or blood products. Findings are deemed age-appropriate.  Incidental signs of chronic sinus disease, no acute findings there.  No further action is required on this test at this time, other than re-enforcing the importance of good blood pressure control, good cholesterol control, good blood sugar control, and weight management. Please remind patient to keep any upcoming appointments or tests and to call us with any interim questions, concerns, problems or updates. Thanks,  Huston FoleySaima Athar, MD, PhD

## 2016-06-24 NOTE — Progress Notes (Signed)
Please call patient regarding the recent brain MRI: The brain scan showed a normal structure of the brain and mild volume loss which we call atrophy. There were changes in the deeper structures of the brain, which we call white matter changes or microvascular changes. These were reported as mild in Her case. These are tiny white spots, that occur with time and are seen in a variety of conditions, including with normal aging, chronic hypertension, chronic headaches, especially migraine HAs, chronic diabetes, chronic hyperlipidemia. These are not strokes and no mass or lesion or contrast enhancement was seen which is reassuring. Again, there were no acute findings, such as a stroke, or mass or blood products. Findings are deemed age-appropriate.  Incidental signs of chronic sinus disease, no acute findings there.  No further action is required on this test at this time, other than re-enforcing the importance of good blood pressure control, good cholesterol control, good blood sugar control, and weight management. Please remind patient to keep any upcoming appointments or tests and to call us with any interim questions, concerns, problems or updates. Thanks,  Huston FoleySaima Jordyn Doane, MD, PhD

## 2016-06-24 NOTE — Telephone Encounter (Signed)
I spoke to patient and she is aware of results and recommendations.  

## 2016-07-23 ENCOUNTER — Encounter (HOSPITAL_BASED_OUTPATIENT_CLINIC_OR_DEPARTMENT_OTHER): Payer: Self-pay | Admitting: *Deleted

## 2016-07-23 ENCOUNTER — Emergency Department (HOSPITAL_BASED_OUTPATIENT_CLINIC_OR_DEPARTMENT_OTHER)
Admission: EM | Admit: 2016-07-23 | Discharge: 2016-07-23 | Disposition: A | Discharge status: Home / Self Care | Payer: Medicare Other | Attending: Emergency Medicine | Admitting: Emergency Medicine

## 2016-07-23 DIAGNOSIS — Z79899 Other long term (current) drug therapy: Secondary | ICD-10-CM | POA: Insufficient documentation

## 2016-07-23 DIAGNOSIS — Z87891 Personal history of nicotine dependence: Secondary | ICD-10-CM | POA: Insufficient documentation

## 2016-07-23 DIAGNOSIS — T464X5A Adverse effect of angiotensin-converting-enzyme inhibitors, initial encounter: Secondary | ICD-10-CM | POA: Diagnosis not present

## 2016-07-23 DIAGNOSIS — I10 Essential (primary) hypertension: Secondary | ICD-10-CM | POA: Insufficient documentation

## 2016-07-23 DIAGNOSIS — R220 Localized swelling, mass and lump, head: Secondary | ICD-10-CM | POA: Diagnosis present

## 2016-07-23 DIAGNOSIS — Z7982 Long term (current) use of aspirin: Secondary | ICD-10-CM | POA: Diagnosis not present

## 2016-07-23 DIAGNOSIS — T783XXA Angioneurotic edema, initial encounter: Secondary | ICD-10-CM | POA: Insufficient documentation

## 2016-07-23 MED ORDER — DIPHENHYDRAMINE HCL 50 MG/ML IJ SOLN
50.0000 mg | Freq: Once | INTRAMUSCULAR | Status: AC
  Administered 2016-07-23: 50 mg via INTRAVENOUS
  Filled 2016-07-23: qty 1

## 2016-07-23 MED ORDER — DEXAMETHASONE SODIUM PHOSPHATE 10 MG/ML IJ SOLN
10.0000 mg | Freq: Once | INTRAMUSCULAR | Status: AC
Start: 2016-07-23 — End: 2016-07-23
  Administered 2016-07-23: 10 mg via INTRAVENOUS
  Filled 2016-07-23: qty 1

## 2016-07-23 MED ORDER — FAMOTIDINE IN NACL 20-0.9 MG/50ML-% IV SOLN
20.0000 mg | Freq: Once | INTRAVENOUS | Status: AC
  Administered 2016-07-23: 20 mg via INTRAVENOUS
  Filled 2016-07-23: qty 50

## 2016-07-23 NOTE — ED Provider Notes (Signed)
[facility] Provider Note: Lowella Dell, MD, FACEP  CSN: [id number] MRN: [id number] ARRIVAL: 07/23/16 at 0005 ROOM: MH01/MH01   CHIEF COMPLAINT  Oral Swelling   HISTORY OF PRESENT ILLNESS  Hannah [name] is a 71 y.o. female who has been on ramipril for an extended period of time. She has no history of angioedema. She was recently started on Augmentin for bronchitis. She is here with edema of the upper lip which began about 8 PM yesterday evening. The onset was gradual. Symptoms are moderate. There is no associated tongue or throat swelling. She is not feeling short of breath. She is not having difficulty speaking. There are no specific exacerbating or mitigating factors.   Past Medical History:  Diagnosis Date  . Family history of heart disease   . Hyperlipidemia   . Hypertension   . 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 ABI - BILATERAL  2008   moderate arterial insufficiency at rest; pulsatile flow of bilateral ankle PVRs  . APPENDECTOMY  11/21/2002   acute appendicitis  . LEFT HEART CATHETERIZATION WITH CORONARY ANGIOGRAM N/A 11/10/2011   Procedure: LEFT HEART CATHETERIZATION WITH CORONARY ANGIOGRAM;  Surgeon: Runell Gess, MD;  Location: [facility] CATH LAB;  Service: Cardiovascular;  Laterality: N/A; - normal left main/LAD/L Cfx/RCA  . NM MYOCAR PERF WALL MOTION  10/2011   lexiscan - EF71%, normal perfusion; low risk scan  . ROTATOR CUFF REPAIR  1995  . TRANSTHORACIC ECHOCARDIOGRAM  10/2011   EF=>55%, calcified moderator band in the RV; borderline LA enlargement; mild MR; mild TR; trace AV regurg & pulm valve regurg    Family History  Problem Relation Age of Onset  . Heart disease Father   . Hypertension Father   . Cancer Father   . Heart attack Father     in 30s  . Hypertension Brother     2 with CABG  . Stroke Sister   . Hypertension Sister   . Heart Problems Child     born with enlarged heart    Social  History  Substance Use Topics  . Smoking status: Former Games developer  . Smokeless tobacco: Never Used  . Alcohol use Yes     Comment: occasional    Prior to Admission medications   Medication Sig Start Date End Date Taking? Authorizing Provider  aspirin EC 81 MG tablet Take 81 mg by mouth daily.    Historical Provider, MD  clopidogrel (PLAVIX) 75 MG tablet Take 75 mg by mouth daily.    Historical Provider, MD  Coenzyme Q-10 200 MG CAPS Take 1 capsule by mouth daily.    Historical Provider, MD  DEXILANT 60 MG capsule Take 60 mg by mouth daily. 08/25/15   Historical Provider, MD  diazepam (VALIUM) 5 MG tablet Take 2.5 mg by mouth daily.  06/01/13   Historical Provider, MD  hydrochlorothiazide (MICROZIDE) 12.5 MG capsule Take 12.5 mg by mouth daily.    Historical Provider, MD  metoprolol tartrate (LOPRESSOR) 25 MG tablet Take 1 tablet (25 mg total) by mouth 2 (two) times daily. 04/26/16   Rhonda G Barrett, PA-C  ramipril (ALTACE) 10 MG capsule Take 10 mg by mouth daily.    Historical Provider, MD  rosuvastatin (CRESTOR) 40 MG tablet Take 40 mg by mouth daily.    Historical Provider, MD    Allergies Patient has no known allergies.   REVIEW OF SYSTEMS  Negative except as noted here or  in the History of Present Illness.   PHYSICAL EXAMINATION  Initial Vital Signs Blood pressure (!) 152/77, pulse 89, temperature 98.6 F (37 C), temperature source Oral, resp. rate (!) 21, height 5' 2.5" (1.588 m), weight 125 lb (56.7 kg), SpO2 100 %.  Examination General: Well-developed, well-nourished female in no acute distress; appearance consistent with age of record HENT: normocephalic; atraumatic; angioedema of a lip greater on the right; no edema of tongue; no edema of oropharynx; uvula midline; no dysphonia; no stridor Eyes: pupils equal, round and reactive to light; extraocular muscles intact Neck: supple Heart: regular rate and rhythm Lungs: clear to auscultation bilaterally Abdomen: soft;  nondistended; nontender; no masses or hepatosplenomegaly; bowel sounds present Extremities: No deformity; full range of motion; pulses normal Neurologic: Awake, alert and oriented; motor function intact in all extremities and symmetric; no facial droop Skin: Warm and dry Psychiatric: Normal mood and affect   RESULTS  Summary of this visit's results, reviewed by myself:   EKG Interpretation  Date/Time:    Ventricular Rate:    PR Interval:    QRS Duration:   QT Interval:    QTC Calculation:   R Axis:     Text Interpretation:        Laboratory Studies: No results found for this or any previous visit (from the past 24 hour(s)). Imaging Studies: No results found.  ED COURSE  Nursing notes and initial vitals signs, including pulse oximetry, reviewed.  Vitals:   07/23/16 0011 07/23/16 0012 07/23/16 0220  BP:  (!) 152/77 (!) 166/67  Pulse:  89 68  Resp:  (!) 21 18  Temp:  98.6 F (37 C)   TempSrc:  Oral   SpO2:  100% 100%  Weight: 125 lb (56.7 kg)    Height: 5' 2.5" (1.588 m)     3:42 AM The patient has been observed in the ED for several hours with no progression of her symptoms. She still has no tongue or throat involvement. She was advised to discontinue her ACE inhibitor and to contact her physician later today.  PROCEDURES    ED DIAGNOSES     ICD-9-CM ICD-10-CM   1. Angiotensin converting enzyme inhibitor-aggravated angioedema, initial encounter 995.1 T78.3XXA    E942.6 T46.4X5A        Paula Libra, MD 07/23/16 [phone]

## 2016-07-23 NOTE — ED Triage Notes (Signed)
Pt c/o upper lip swelling after taking ABX x 2 dose, denies tongue swelling

## 2016-07-23 NOTE — ED Notes (Signed)
c/o upper lip swelling, tightness and tingling x 4.5 hours  No diff talking  Denies tongue swelling

## 2016-07-28 ENCOUNTER — Telehealth: Payer: Self-pay | Admitting: Cardiovascular Disease

## 2016-07-28 NOTE — Telephone Encounter (Signed)
Message sent to scheduling to schedule appt.

## 2016-07-28 NOTE — Telephone Encounter (Signed)
Returned call to patient-patient reports recently being in ER due to angioedema and was taken off he ramipril and her abx amoxicillin for bronchitis.  Wanted to let Dr. Allyson Sabal know.  Reports PCP started her on amlodipine  but has not taken as she is afraid she will have another reaction.  Reports not taking hctz either as she is "weary" of medications at this time and has read bad things about it.    Reports she is currently taking metoprolol  BID.   BP readings: 126/69 135/77 129/64 124/61 127/67 117/67 114/61 120/65 130/78 115/61 118/67 131/71 142/75   Wondering if she can just take the metoprolol BID and hctz-advised to continue monitoring BP and if consistently elevated to call and let us know, as additional medication adjustments may need to be made. Otherwise, we route to Dr. Allyson Sabal for further recommendatiosn.  Patient aware and verbalized understanding.  Will continue metoprolol and hctz, monitor and call our office if consistently elevated.

## 2016-07-28 NOTE — Telephone Encounter (Signed)
New Message   pt verbalized that she is calling for rn   she was taken off of her medication while in the hospital for bronchitis  She said she wants to speak to the rn and give updates

## 2016-07-28 NOTE — Telephone Encounter (Signed)
Have her come in to see [name] for evaluation of her medications and hypertension.

## 2016-08-04 ENCOUNTER — Ambulatory Visit (INDEPENDENT_AMBULATORY_CARE_PROVIDER_SITE_OTHER): Payer: Medicare Other | Admitting: Neurology

## 2016-08-04 ENCOUNTER — Encounter: Payer: Self-pay | Admitting: Neurology

## 2016-08-04 VITALS — BP 123/70 | HR 98 | Ht 62.0 in | Wt 127.0 lb

## 2016-08-04 DIAGNOSIS — G4761 Periodic limb movement disorder: Secondary | ICD-10-CM

## 2016-08-04 DIAGNOSIS — G479 Sleep disorder, unspecified: Secondary | ICD-10-CM
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
## 2016-08-04 NOTE — Patient Instructions (Addendum)
Your sleep study did not show any significant obstructive sleep apnea. But you slept poorly.  You had leg twitching in sleep, which did not disturb your sleep very much, but we could try medication for this in the future, when your bronchitis and your BP medications are stable.  You can be monitored for restless legs symptoms. Keep in mind restless legs syndrome (RLS) is associated with anemia and iron deficiency.  Please remember to try to maintain good sleep hygiene, which means: Keep a regular sleep and wake schedule, try not to exercise or have a meal within 2 hours of your bedtime, try to keep your bedroom conducive for sleep, that is, cool and dark, without light distractors such as an illuminated alarm clock, and refrain from watching TV right before sleep or in the middle of the night and do not keep the TV or radio on during the night. Also, try not to use or play on electronic devices at bedtime, such as your cell phone, tablet PC or laptop. If you like to read at bedtime on an electronic device, try to dim the background light as much as possible. Do not eat in the middle of the night.   I will see you back in 3 months and we will think about medication for PLMs.   Please talk to Dr. Ludwig Clarks about medication for anxiety, in lieu of Valium at bedtime or the clonazepam.

## 2016-08-10 NOTE — Telephone Encounter (Signed)
Pt was scheduled for 07/29/17. She cancelled her appt and no reschedule made.

## 2016-10-25 ENCOUNTER — Telehealth: Payer: Self-pay | Admitting: Cardiovascular Disease

## 2016-10-25 NOTE — Telephone Encounter (Signed)
Pt says she need to be seen please.She says her heart is beating too fast and it seems like a double beat and sometimes it feels like it is a tremor.

## 2016-10-25 NOTE — Telephone Encounter (Signed)
Returned call to patient. She states she has ongoing issues for a period of months and has been seen for similar issues. She has noticed recently that her heart rhythm is off. She states she wakes up with a rapid heart beat. She states when she tried to check her pulse she can feel a beat and then a "tremor" after. She always has shortness of breath, denies chest pain. Symptoms going on about 2 weeks. Patient wore a heart monitor 1 year ago - SR/ST with rare PVCs.   Patient monitors BP + HR at home Average BP 130/60 She states her HR is over 100 in the morning and lessens throughout the day She states in the afternoon her HR is in the 60s  Advised would defer to Dr. Allyson SabalBerry for advice on her symptoms and notify her accordingly

## 2016-10-28 ENCOUNTER — Ambulatory Visit (INDEPENDENT_AMBULATORY_CARE_PROVIDER_SITE_OTHER): Payer: Medicare Other | Admitting: *Deleted

## 2016-10-28 VITALS — HR 77

## 2016-10-28 DIAGNOSIS — R002 Palpitations: Secondary | ICD-10-CM

## 2016-10-28 NOTE — Telephone Encounter (Signed)
Spoke w Wynema BirchHao. No available APP slots today. [facility], they have no availability until next Wednesday. Noted as long as pt vital signs stable, no dizziness/near syncope,etc, OK to have her follow up in a few days.  Pt voices no concerns for urgent symptoms.  We discussed options. She has several schedule conflicts, voiced she will be going to a conference this weekend, leaving early AM tomorrow. Pt requested EKG check. Informed her I could do this for her, will review it with physician - if OK may need to wear monitor, will confer w [facility] at time of visit.  Pt added to nurse schedule today at 1:20pm.

## 2016-10-28 NOTE — Patient Instructions (Addendum)
Medication Instructions:   No changes You may take an extra 25mg  of metoprolol as needed for control of palpitations.   Labwork:   none  Testing/Procedures:  none  Follow-Up:  As scheduled w Azalee CourseHao Meng PA (8.8/18 [facility])    If you need a refill on your cardiac medications before your next appointment, please call your pharmacy.

## 2016-10-28 NOTE — Telephone Encounter (Signed)
Spoke to patient.  Follow up for phone call earlier this week.  She's not reporting new symptoms, however, very nervous regarding her continued symptoms. States she had intermittent palpitations overnight and has been feeling like her heart is "off" rhythm. Aware we were waiting for Dr. Hazle CocaBerry's recommendations - she voiced she would prefer to be seen soon - I've informed her that Dr. Allyson SabalBerry is out of office next few days. She is amenable to see APP. Will check and see if provider availability and return her call.

## 2016-10-28 NOTE — Telephone Encounter (Signed)
Pt seen, recommendations by provider made, and she has f/u appt w Wynema BirchHao in ~3 weeks. See nurse visit note.

## 2016-10-28 NOTE — Progress Notes (Signed)
Patient came in for nurse visit EKG d/t c/o palpitations. Reviewed EKG w Dr. Herbie BaltimoreHarding ([facility]) - signed for NSR. Recommendation from physician was to f/u in 2-3 weeks w APP, may take extra 25mg  metoprolol PRN mid-day if she feels she needs for control of palpitations. Monitor BP and HR and report any concerns. Discussed recommendations in detail w patient and scheduled her for f/u visit. She is aware to call if new questions.

## 2016-10-28 NOTE — Telephone Encounter (Signed)
Pt says she was waiting to hear from somebody.She is still not feeling good..Pt would like be seen if possible.please.

## 2016-11-03 ENCOUNTER — Ambulatory Visit: Payer: Medicare Other | Admitting: Neurology

## 2016-11-05 ENCOUNTER — Other Ambulatory Visit: Payer: Self-pay | Admitting: Physician Assistant

## 2016-11-10 ENCOUNTER — Other Ambulatory Visit: Payer: Self-pay | Admitting: Neurology

## 2016-11-10 ENCOUNTER — Ambulatory Visit (INDEPENDENT_AMBULATORY_CARE_PROVIDER_SITE_OTHER): Payer: Medicare Other | Admitting: Neurology

## 2016-11-10 ENCOUNTER — Encounter: Payer: Self-pay | Admitting: Neurology

## 2016-11-10 VITALS — BP 147/73 | HR 61 | Ht 62.0 in | Wt 128.0 lb

## 2016-11-10 DIAGNOSIS — G479 Sleep disorder, unspecified: Secondary | ICD-10-CM

## 2016-11-10 DIAGNOSIS — G4761 Periodic limb movement disorder: Secondary | ICD-10-CM

## 2016-11-10 MED ORDER — PRAMIPEXOLE DIHYDROCHLORIDE 0.125 MG PO TABS: ORAL_TABLET | ORAL | 5 refills | Status: DC

## 2016-11-10 NOTE — Patient Instructions (Addendum)
For your leg twitching at night and to see if you can sleep through the night better, let's try: Mirapex (generic name: pramipexole) 0.125 mg: Take 1 pill each night for 1 week, the 2 pills each night for 1 week, then 3 pills each night thereafter. Common side effects reported are: Sedation, sleepiness, nausea, vomiting, and rare side effects are confusion, hallucinations, swelling in legs, and abnormal behaviors, including impulse control problems, which can manifest as excessive eating, obsessions with food or gambling, or hypersexuality. Take the medication 90 min to 2 h before your bedtime to let it kick in.  We can see you in about 3 mo, you can see one of our nurse practitioners.
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
## 2016-11-17 ENCOUNTER — Ambulatory Visit (INDEPENDENT_AMBULATORY_CARE_PROVIDER_SITE_OTHER): Payer: Medicare Other | Admitting: Physician Assistant

## 2016-11-17 ENCOUNTER — Encounter: Payer: Self-pay | Admitting: Physician Assistant

## 2016-11-17 VITALS — BP 112/62 | HR 68 | Ht 62.0 in | Wt 128.0 lb

## 2016-11-17 DIAGNOSIS — R002 Palpitations: Secondary | ICD-10-CM

## 2016-11-17 DIAGNOSIS — I739 Peripheral vascular disease, unspecified: Secondary | ICD-10-CM | POA: Diagnosis not present

## 2016-11-17 DIAGNOSIS — E785 Hyperlipidemia, unspecified: Secondary | ICD-10-CM

## 2016-11-17 DIAGNOSIS — I10 Essential (primary) hypertension: Secondary | ICD-10-CM

## 2016-11-17 NOTE — Progress Notes (Signed)
Cardiology Office Note    Date:  11/19/2016   ID:  Francee, Setzer Jan 19, 1946, MRN [id number]  PCP:  Ralene Ok, MD  Cardiologist:  Dr. Allyson Sabal   Chief Complaint  Patient presents with  . Follow-up    6 months. Seen for Dr. Allyson Sabal  . Headache  . Shortness of Breath    History of Present Illness:  ITALIA WOLFERT is a 71 y.o. female with PMH of norm cor by cath 11/10/2011, HTN, HLD and PAD with Leriche syndrome. 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 Lumbar and IMA collaterals. Otherwise she had no lifestyle limiting claudication. She had remote tobacco abuse however quit over 20 years ago. She also have strong family history of coronary artery disease with a father having MI in his 8s with 2 brother having bypass surgery. She also had an event monitor in the past this unrevealing for palpitation. Her ramipril was discontinued in April after she presented to the hospital with an episode of angioedema. She called our office on 10/25/2016 with palpitation. EKG obtained on the 7/19 showed a normal sinus rhythm.  She presents today for evaluation of palpitation. For the past month, she has been having persistent tachycardia palpitation the last few minutes at a time. It is not usually more noticeable at night when she is laying down. She denies any exertional symptoms including chest pain or shortness breath. She denies any dizziness during the episode. She does not have any significant nausea and edema, orthopnea or PND.    Past Medical History:  Diagnosis Date  . Family history of heart disease   . Hyperlipidemia   . Hypertension   . 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 ABI - BILATERAL  2008   moderate arterial insufficiency at rest; pulsatile flow of bilateral ankle PVRs  . APPENDECTOMY   11/21/2002   acute appendicitis  . LEFT HEART CATHETERIZATION WITH CORONARY ANGIOGRAM N/A 11/10/2011   Procedure: LEFT HEART CATHETERIZATION WITH CORONARY ANGIOGRAM;  Surgeon: Runell Gess, MD;  [facility] C[facility] LAB;  Service: Cardiovascular;  Laterality: N/A; - normal left main/LAD/L Cfx/RCA  . NM MYOCAR PERF WALL MOTION  10/2011   lexiscan - EF71%, normal perfusion; low risk scan  . ROTATOR CUFF REPAIR  1995  . TRANSTHORACIC ECHOCARDIOGRAM  10/2011   EF=>55%, calcified moderator band in the RV; borderline LA enlargement; mild MR; mild TR; trace AV regurg & pulm valve regurg    Current Medications: Outpatient Medications Prior to Visit  Medication Sig Dispense Refill  . amLODipine (NORVASC) 5 MG tablet Take 5 mg by mouth daily.  3  . aspirin EC 81 MG tablet Take 81 mg by mouth daily.    . clopidogrel (PLAVIX) 75 MG tablet Take 75 mg by mouth daily.    . Coenzyme Q-10 200 MG CAPS Take 1 capsule by mouth daily.    Marland Kitchen DEXILANT 60 MG capsule Take 60 mg by mouth daily.  0  . diazepam (VALIUM) 5 MG tablet Take 2.5 mg by mouth daily.     . hydrochlorothiazide (MICROZIDE) 12.5 MG capsule Take 12.5 mg by mouth daily.    . metoprolol tartrate (LOPRESSOR) 25 MG tablet TAKE 1 TABLET(25 MG) BY MOUTH TWICE DAILY 60 tablet 7  . pramipexole (MIRAPEX) 0.125 MG tablet 1 pill each night x  1 week, then 2 pills each night x 1 week, then 3 pills each night thereafter. Take 90-120 minutes before bedtime. 90 tablet 5  . rosuvastatin (CRESTOR) 40 MG tablet Take 40 mg by mouth daily.     No facility-administered medications prior to visit.      Allergies:   Amoxicillin and Ramipril   Social History   Social History  . Marital status: Married    Spouse name: N/A  . Number of children: 1  . Years of education: 54   Social History Main Topics  . Smoking status: Former Games developer  . Smokeless tobacco: Never Used  . Alcohol use Yes     Comment: occasional  . Drug use: No  . Sexual activity: Not Asked    Other Topics Concern  . None   Social History Narrative  . None     Family History:  The patient's family history includes Cancer in her father; Heart Problems in her child; Heart attack in her father; Heart disease in her father; Hypertension in her brother, father, and sister; Stroke in her sister.   ROS:   Please see the history of present illness.    ROS All other systems reviewed and are negative.   PHYSICAL EXAM:   VS:  BP 112/62   Pulse 68   Ht 5\' 2"  (1.575 m)   Wt 128 lb (58.1 kg)   BMI 23.41 kg/m    GEN: Well nourished, well developed, in no acute distress  HEENT: normal  Neck: no JVD, carotid bruits, or masses Cardiac: RRR; no murmurs, rubs, or gallops,no edema  Respiratory:  clear to auscultation bilaterally, normal work of breathing GI: soft, nontender, nondistended, + BS MS: no deformity or atrophy  Skin: warm and dry, no rash Neuro:  Alert and Oriented x 3, Strength and sensation are intact Psych: euthymic mood, full affect  Wt Readings from Last 3 Encounters:  11/17/16 128 lb (58.1 kg)  11/10/16 128 lb (58.1 kg)  08/04/16 127 lb (57.6 kg)      Studies/Labs Reviewed:   EKG:  EKG is not ordered today.    Recent Labs: 04/10/2016: BUN 13; Creatinine, Ser 0.81; Hemoglobin 12.2; Platelets 321; Potassium 3.8; Sodium 140   Lipid Panel No results found for: CHOL, TRIG, HDL, CHOLHDL, VLDL, LDLCALC, LDLDIRECT  Additional studies/ records that were reviewed today include:   Echo 10/20/2015 LV EF: 55% -   60%  Study Conclusions  - Left ventricle: The cavity size was normal. Systolic function was   normal. The estimated ejection fraction was in the range of 55%   to 60%. Wall motion was normal; there were no regional wall   motion abnormalities. Features are consistent with a pseudonormal   left ventricular filling pattern, with concomitant abnormal   relaxation and increased filling pressure (grade 2 diastolic   dysfunction). - Aortic valve:  Trileaflet; mildly thickened, mildly calcified   leaflets. - Mitral valve: There was mild regurgitation.   ASSESSMENT:    1. Palpitations   2. PAD (peripheral artery disease) (HCC)   3. Essential hypertension   4. Hyperlipidemia, unspecified hyperlipidemia type      PLAN:  In order of problems listed above:  1. Palpitation: For the past several weeks, she has been having tachycardia palpitations lasting a few minutes at a time. She had a monitor in July of last year that did not show significant arrhythmia. Recent symptom, however suggest either SVT or PAF. I recommended a 30 day heart monitor  to further assess. She is currently on metoprolol 25 mg twice a day, she has not missed any doses recently.  2. PAD: No obvious claudication symptoms.  3. Hypertension: Blood pressure stable on current medication  4. Hyperlipidemia: Continue Crestor 40 mg daily    Medication Adjustments/Labs and Tests Ordered: Current medicines are reviewed at length with the patient today.  Concerns regarding medicines are outlined above.  Medication changes, Labs and Tests ordered today are listed in the Patient Instructions below. Patient Instructions  Medication Instructions:   No changes  Labwork:   none  Testing/Procedures:  Your physician has recommended that you wear an event monitor for 30 days.   Event monitors are medical devices that record the heart's electrical activity. Doctors most often us these monitors to diagnose arrhythmias. Arrhythmias are problems with the speed or rhythm of the heartbeat. The monitor is a small, portable device. You can wear one while you do your normal daily activities. This is usually used to diagnose what is causing palpitations/syncope (passing out).    Follow-Up:  With Dr. Allyson SabalBerry or Azalee CourseHao Khalise Billard PA in 2 months.  If you need a refill on your cardiac medications before your next appointment, please call your pharmacy.      Ramond DialSigned, Meilani Edmundson, [place]    11/19/2016 1:27 PM    [facility] [address], [place], [place]  [address] Phone: [phone]) [phone]; Fax: [phone]

## 2016-11-17 NOTE — Patient Instructions (Signed)
Medication Instructions:   No changes  Labwork:   none  Testing/Procedures:  Your physician has recommended that you wear an event monitor for 30 days.   Event monitors are medical devices that record the heart's electrical activity. Doctors most often us these monitors to diagnose arrhythmias. Arrhythmias are problems with the speed or rhythm of the heartbeat. The monitor is a small, portable device. You can wear one while you do your normal daily activities. This is usually used to diagnose what is causing palpitations/syncope (passing out).    Follow-Up:  With Dr. Allyson SabalBerry or Azalee CourseHao Meng PA in 2 months.  If you need a refill on your cardiac medications before your next appointment, please call your pharmacy.

## 2016-11-19 ENCOUNTER — Encounter: Payer: Self-pay | Admitting: Physician Assistant

## 2016-11-27 ENCOUNTER — Encounter (HOSPITAL_BASED_OUTPATIENT_CLINIC_OR_DEPARTMENT_OTHER): Payer: Self-pay | Admitting: Emergency Medicine

## 2016-11-27 ENCOUNTER — Emergency Department (HOSPITAL_BASED_OUTPATIENT_CLINIC_OR_DEPARTMENT_OTHER): Payer: Medicare Other

## 2016-11-27 ENCOUNTER — Inpatient Hospital Stay (HOSPITAL_BASED_OUTPATIENT_CLINIC_OR_DEPARTMENT_OTHER)
Admission: EM | Admit: 2016-11-27 | Discharge: 2016-11-29 | DRG: 313 | Disposition: A | Discharge status: Home / Self Care | Payer: Medicare Other | Attending: Internal Medicine | Admitting: Internal Medicine

## 2016-11-27 DIAGNOSIS — I739 Peripheral vascular disease, unspecified: Secondary | ICD-10-CM | POA: Diagnosis present

## 2016-11-27 DIAGNOSIS — Z8249 Family history of ischemic heart disease and other diseases of the circulatory system: Secondary | ICD-10-CM

## 2016-11-27 DIAGNOSIS — I7409 Other arterial embolism and thrombosis of abdominal aorta: Secondary | ICD-10-CM | POA: Diagnosis present

## 2016-11-27 DIAGNOSIS — I10 Essential (primary) hypertension: Secondary | ICD-10-CM | POA: Diagnosis present

## 2016-11-27 DIAGNOSIS — Z79899 Other long term (current) drug therapy: Secondary | ICD-10-CM

## 2016-11-27 DIAGNOSIS — Z7982 Long term (current) use of aspirin: Secondary | ICD-10-CM

## 2016-11-27 DIAGNOSIS — R079 Chest pain, unspecified: Secondary | ICD-10-CM | POA: Diagnosis present

## 2016-11-27 DIAGNOSIS — R002 Palpitations: Secondary | ICD-10-CM | POA: Diagnosis present

## 2016-11-27 DIAGNOSIS — D649 Anemia, unspecified: Secondary | ICD-10-CM | POA: Diagnosis present

## 2016-11-27 DIAGNOSIS — Z87891 Personal history of nicotine dependence: Secondary | ICD-10-CM

## 2016-11-27 DIAGNOSIS — E876 Hypokalemia: Secondary | ICD-10-CM | POA: Diagnosis present

## 2016-11-27 DIAGNOSIS — R0789 Other chest pain: Principal | ICD-10-CM | POA: Diagnosis present

## 2016-11-27 DIAGNOSIS — Z888 Allergy status to other drugs, medicaments and biological substances status: Secondary | ICD-10-CM

## 2016-11-27 DIAGNOSIS — Z7902 Long term (current) use of antithrombotics/antiplatelets: Secondary | ICD-10-CM

## 2016-11-27 DIAGNOSIS — E7800 Pure hypercholesterolemia, unspecified: Secondary | ICD-10-CM | POA: Diagnosis present

## 2016-11-27 DIAGNOSIS — I509 Heart failure, unspecified: Secondary | ICD-10-CM | POA: Diagnosis present

## 2016-11-27 DIAGNOSIS — I110 Hypertensive heart disease with heart failure: Secondary | ICD-10-CM | POA: Diagnosis present

## 2016-11-27 DIAGNOSIS — E785 Hyperlipidemia, unspecified: Secondary | ICD-10-CM | POA: Diagnosis present

## 2016-11-27 DIAGNOSIS — Z9114 Patient's other noncompliance with medication regimen: Secondary | ICD-10-CM

## 2016-11-27 DIAGNOSIS — R072 Precordial pain: Secondary | ICD-10-CM

## 2016-11-27 DIAGNOSIS — I471 Supraventricular tachycardia: Secondary | ICD-10-CM | POA: Diagnosis not present

## 2016-11-27 DIAGNOSIS — Z881 Allergy status to other antibiotic agents status: Secondary | ICD-10-CM

## 2016-11-27 LAB — CBC WITH DIFFERENTIAL/PLATELET
BASOS ABS: 0 10*3/uL (ref 0.0–0.1)
Basophils Relative: 0 %
EOS ABS: 0 10*3/uL (ref 0.0–0.7)
Eosinophils Relative: 1 %
HCT: 35.6 % — ABNORMAL LOW (ref 36.0–46.0)
Hemoglobin: 11.5 g/dL — ABNORMAL LOW (ref 12.0–15.0)
LYMPHS PCT: 35 %
Lymphs Abs: 1.8 10*3/uL (ref 0.7–4.0)
MCH: 27.6 pg (ref 26.0–34.0)
MCHC: 32.3 g/dL (ref 30.0–36.0)
MCV: 85.6 fL (ref 78.0–100.0)
MONO ABS: 0.5 10*3/uL (ref 0.1–1.0)
Monocytes Relative: 10 %
Neutro Abs: 2.9 10*3/uL (ref 1.7–7.7)
Neutrophils Relative %: 54 %
PLATELETS: 354 10*3/uL (ref 150–400)
RBC: 4.16 MIL/uL (ref 3.87–5.11)
RDW: 14.8 % (ref 11.5–15.5)
WBC: 5.3 10*3/uL (ref 4.0–10.5)

## 2016-11-27 LAB — BASIC METABOLIC PANEL
ANION GAP: 8 (ref 5–15)
BUN: 14 mg/dL (ref 6–20)
CALCIUM: 9.1 mg/dL (ref 8.9–10.3)
CO2: 28 mmol/L (ref 22–32)
Chloride: 103 mmol/L (ref 101–111)
Creatinine, Ser: 0.85 mg/dL (ref 0.44–1.00)
GFR calc Af Amer: >60 mL/min (ref >60)
GLUCOSE: 90 mg/dL (ref 65–99)
Potassium: 3.2 mmol/L — ABNORMAL LOW (ref 3.5–5.1)
Sodium: 139 mmol/L (ref 135–145)

## 2016-11-27 LAB — TROPONIN I: Troponin I: <0.03 ng/mL (ref <0.03)

## 2016-11-27 MED ORDER — SODIUM CHLORIDE 0.9 % IV BOLUS (SEPSIS): 1000.0000 mL | Freq: Once | INTRAVENOUS | Status: DC

## 2016-11-27 MED ORDER — ASPIRIN 81 MG PO CHEW
324.0000 mg | CHEWABLE_TABLET | Freq: Once | ORAL | Status: AC
  Administered 2016-11-27: 324 mg via ORAL
  Filled 2016-11-27: qty 4

## 2016-11-27 MED ORDER — NITROGLYCERIN 0.4 MG SL SUBL
0.4000 mg | SUBLINGUAL_TABLET | SUBLINGUAL | Status: DC | PRN
  Administered 2016-11-27 (×2): 0.4 mg via SUBLINGUAL
  Filled 2016-11-27: qty 1

## 2016-11-27 NOTE — ED Notes (Signed)
Pt describes "chest pressure as comes and goes, stays for maybe 15 minutes, leaves for maybe 30 minutes, mostly in the morning, sometimes it wakes me up". Denies any pain now. "Last episode of heart racing was yesterday".

## 2016-11-27 NOTE — ED Notes (Signed)
Patient transported to X-ray 

## 2016-11-27 NOTE — ED Notes (Signed)
ED Provider at bedside. 

## 2016-11-27 NOTE — ED Provider Notes (Signed)
Emergency Department Provider Note  By signing my name below, I, Vista Mink, attest that this documentation has been prepared under the direction and in the presence of Kathlen Mody, MD. Electronically signed, Vista Mink, ED Scribe. 11/27/16. 5:34 PM.   I have reviewed the triage vital signs and the nursing notes.   HISTORY  Chief Complaint Chest Pain   HPI HPI Comments: Hannah [name] is a 71 y.o. female with Hx of HTN, HLD, who presents to the Emergency Department complaining of intermittent, waxing and waning, left sided chest pain that started approximately one week ago. Pt describes the pain as pressure, intermittently radiating to her shoulders. She denies any specific exacerbating or alleviating factors to her pain. Pt reports that she has experienced heart palpitations in the morning described as "racing" and "quivering". These episodes will also wake her up in the middle of the night. She denies any pain during these episodes but states that it is uncomfortable. Pt is currently experiencing pressure in her chest but denies any palpitations at the moment. Pt further reports pain to her right shoulder/arm, neck and jaw, but denies this being brought on during episodes of chest discomfort. Pt also notes headache and a cough this morning. She reports Hx of similar chest pressure which occurred 3 months ago but resolved shortly after without treatment. Pt saw PCP recently and had echo, US performed but reports everything was normal. She denies any Hx of MI but had cardiac cath 3 years ago without any significant findings. She did not have stent placed. No fever, chills.   Past Medical History:  Diagnosis Date  . Family history of heart disease   . Hyperlipidemia   . Hypertension   . 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 Normocytic anemia 11/28/2016  . Palpitations 06/08/2016  . Chest pain 08/09/2014  . Peripheral arterial disease  (HCC) 06/25/2013  . Essential hypertension 06/25/2013  . Hyperlipidemia 06/25/2013    Past Surgical History:  Procedure Laterality Date  . ABI - BILATERAL  2008   moderate arterial insufficiency at rest; pulsatile flow of bilateral ankle PVRs  . APPENDECTOMY  11/21/2002   acute appendicitis  . LEFT HEART CATHETERIZATION WITH CORONARY ANGIOGRAM N/A 11/10/2011   Procedure: LEFT HEART CATHETERIZATION WITH CORONARY ANGIOGRAM;  Surgeon: Runell Gess, MD;  Location: [facility] CATH LAB;  Service: Cardiovascular;  Laterality: N/A; - normal left main/LAD/L Cfx/RCA  . NM MYOCAR PERF WALL MOTION  10/2011   lexiscan - EF71%, normal perfusion; low risk scan  . ROTATOR CUFF REPAIR  1995  . TRANSTHORACIC ECHOCARDIOGRAM  10/2011   EF=>55%, calcified moderator band in the RV; borderline LA enlargement; mild MR; mild TR; trace AV regurg & pulm valve regurg      Allergies Amoxicillin and Ramipril  Family History  Problem Relation Age of Onset  . Heart disease Father   . Hypertension Father   . Cancer Father   . Heart attack Father        in 30s  . Hypertension Brother        2 with CABG  . Stroke Sister   . Hypertension Sister   . Heart Problems Child        born with enlarged heart    Social History Social History  Substance Use Topics  . Smoking status: Former Games developer  . Smokeless tobacco: Never Used  . Alcohol use Yes     Comment: occasional  Review of Systems Constitutional: No fever/chills Eyes: No visual changes. ENT: No sore throat. Cardiovascular: + chest pain. Respiratory: Denies shortness of breath. Gastrointestinal: No abdominal pain.  No nausea, no vomiting.  No diarrhea.  No constipation. Genitourinary: Negative for dysuria. Musculoskeletal: Negative for back pain. Skin: Negative for rash. Neurological: + headaches(resolved currently), no focal weakness or numbness.  10-point ROS otherwise negative.  ____________________________________________   PHYSICAL  EXAM:  VITAL SIGNS: ED Triage Vitals  Enc Vitals Group     BP 11/27/16 1659 (!) 143/71     Pulse Rate 11/27/16 1659 71     Resp 11/27/16 1659 18     Temp 11/27/16 1659 98.2 F (36.8 C)     Temp Source 11/27/16 1659 Oral     SpO2 11/27/16 1659 100 %     Weight 11/27/16 1657 128 lb (58.1 kg)     Height 11/27/16 1657 5\' 2"  (1.575 m)     Pain Score 11/27/16 1657 5   Constitutional: Alert and oriented. Well appearing and in no acute distress. Eyes: Conjunctivae are normal.  Head: Atraumatic. Nose: No congestion/rhinnorhea. Mouth/Throat: Mucous membranes are moist.  Oropharynx non-erythematous. Neck: No stridor.  Cardiovascular: Normal rate, regular rhythm. Good peripheral circulation. Grossly normal heart sounds.   Respiratory: Normal respiratory effort.  No retractions. Lungs CTAB. Gastrointestinal: Soft and nontender. No distention.  Musculoskeletal: No lower extremity tenderness nor edema. No gross deformities of extremities. Neurologic:  Normal speech and language. No gross focal neurologic deficits are appreciated.  Skin:  Skin is warm, dry and intact. No rash noted.  ____________________________________________   LABS (all labs ordered are listed, but only abnormal results are displayed)  Labs Reviewed  CBC WITH DIFFERENTIAL/PLATELET - Abnormal; Notable for the following:       Result Value   Hemoglobin 11.5 (*)    HCT 35.6 (*)    All other components within normal limits  BASIC METABOLIC PANEL - Abnormal; Notable for the following:    Potassium 3.2 (*)    All other components within normal limits  TROPONIN I  D-DIMER, QUANTITATIVE (NOT AT [facility])  TROPONIN I  MAGNESIUM  TROPONIN I  BASIC METABOLIC PANEL   ____________________________________________  EKG   EKG Interpretation  Date/Time:  Saturday November 27 2016 16:56:13 EDT Ventricular Rate:  79 PR Interval:  160 QRS Duration: 72 QT Interval:  378 QTC Calculation: 433 R Axis:   55 Text Interpretation:   Normal sinus rhythm Normal ECG No STEMI.  Confirmed by Alona Bene [phone]) on 11/27/2016 5:15:12 PM       ____________________________________________  RADIOLOGY  Dg Chest 2 View  Result Date: 11/27/2016 CLINICAL DATA:  Chest pain for 2 days, initial encounter EXAM: CHEST  2 VIEW COMPARISON:  04/10/2016 FINDINGS: Cardiac shadow is stable. The lungs are well aerated bilaterally. No focal infiltrate or sizable effusion is seen. No acute bony abnormality is noted. IMPRESSION: No active cardiopulmonary disease. Electronically Signed   By: Alcide Clever M.D.   On: 11/27/2016 17:49    ____________________________________________   PROCEDURES  Procedure(s) performed:   Procedures  None ____________________________________________   INITIAL IMPRESSION / ASSESSMENT AND PLAN / ED COURSE  Pertinent labs & imaging results that were available during my care of the patient were reviewed by me and considered in my medical decision making (see chart for details).  Patient presents to the ED for evaluation of chest pain intermittent and worsening over the last week. Multiple CAD risk factors. Doubt PE. Plan for labb and  chest x-ray.   Differential includes all life-threatening causes for chest pain. This includes but is not exclusive to acute coronary syndrome, aortic dissection, pulmonary embolism, cardiac tamponade, community-acquired pneumonia, pericarditis, musculoskeletal chest wall pain, etc.  06:35 PM Patient with continued chest discomfort. Nitroglycerin slightly improved symptoms but not significantly. Labs and troponin are normal. Normal chest x-ray. Given her history plan for admission for cardiac enzyme trending and consideration of chemical stress test.  Discussed patient's case with Hospitalist, Dr. Mariea Clonts.  Recommend admission. Patient and family (if present) updated with plan. Care transferred to Hospitalist service.  I reviewed all nursing notes, vitals, pertinent old  records, EKGs, labs, imaging (as available).  ____________________________________________  FINAL CLINICAL IMPRESSION(S) / ED DIAGNOSES  Final diagnoses:  Precordial chest pain     MEDICATIONS GIVEN DURING THIS VISIT:  Medications  amLODipine (NORVASC) tablet 5 mg (5 mg Oral Given 11/28/16 0926)  pantoprazole (PROTONIX) EC tablet 40 mg (40 mg Oral Given 11/28/16 0926)  diazepam (VALIUM) tablet 2.5 mg (2.5 mg Oral Not Given 11/28/16 0926)  aspirin EC tablet 81 mg (81 mg Oral Given 11/28/16 0925)  clopidogrel (PLAVIX) tablet 75 mg (75 mg Oral Given 11/28/16 0313)  hydrochlorothiazide (MICROZIDE) capsule 12.5 mg (12.5 mg Oral Given 11/28/16 0926)  rosuvastatin (CRESTOR) tablet 40 mg (40 mg Oral Given 11/28/16 0926)  enoxaparin (LOVENOX) injection 40 mg (40 mg Subcutaneous Given 11/28/16 0926)  acetaminophen (TYLENOL) tablet 650 mg (not administered)    Or  acetaminophen (TYLENOL) suppository 650 mg (not administered)  aspirin chewable tablet 324 mg (324 mg Oral Given 11/27/16 1806)  potassium chloride SA (K-DUR,KLOR-CON) CR tablet 40 mEq (40 mEq Oral Given 11/28/16 0313)     NEW OUTPATIENT MEDICATIONS STARTED DURING THIS VISIT:  None   I personally performed the services described in this documentation, which was scribed in my presence. The recorded information has been reviewed and is accurate.    Note:  This document was prepared using Dragon voice recognition software and may include unintentional dictation errors.  Alona Bene, MD Emergency Medicine    Jaspreet Hollings, Arlyss Repress, MD 11/28/16 1028

## 2016-11-27 NOTE — ED Triage Notes (Signed)
Patient states that she has had Chest pain, arm pain, neck pain x 2 days. Reports that she is also SOB  - has a "vascular condition"

## 2016-11-27 NOTE — ED Notes (Signed)
Alert, NAD, calm, interactive, resps e/u, speaking in clear complete sentences, no dyspnea noted, skin W&D, VSS, (denies: pain, sob, nausea, dizziness or visual changes). Family at [facility]. Updated, pending assigned bed to be "ready", and transport. Denies questions or needs.

## 2016-11-28 ENCOUNTER — Encounter (HOSPITAL_COMMUNITY): Payer: Self-pay | Admitting: Family Medicine

## 2016-11-28 DIAGNOSIS — R000 Tachycardia, unspecified: Secondary | ICD-10-CM

## 2016-11-28 DIAGNOSIS — I10 Essential (primary) hypertension: Secondary | ICD-10-CM | POA: Diagnosis not present

## 2016-11-28 DIAGNOSIS — R002 Palpitations: Secondary | ICD-10-CM | POA: Diagnosis not present

## 2016-11-28 DIAGNOSIS — R079 Chest pain, unspecified: Secondary | ICD-10-CM

## 2016-11-28 DIAGNOSIS — D649 Anemia, unspecified: Secondary | ICD-10-CM

## 2016-11-28 DIAGNOSIS — I739 Peripheral vascular disease, unspecified: Secondary | ICD-10-CM

## 2016-11-28 HISTORY — DX: Anemia, unspecified: D64.9

## 2016-11-28 LAB — BASIC METABOLIC PANEL
ANION GAP: 7 (ref 5–15)
BUN: 9 mg/dL (ref 6–20)
CALCIUM: 9 mg/dL (ref 8.9–10.3)
CO2: 25 mmol/L (ref 22–32)
Chloride: 109 mmol/L (ref 101–111)
Creatinine, Ser: 0.8 mg/dL (ref 0.44–1.00)
Glucose, Bld: 94 mg/dL (ref 65–99)
Potassium: 3.9 mmol/L (ref 3.5–5.1)
Sodium: 141 mmol/L (ref 135–145)

## 2016-11-28 LAB — TROPONIN I: Troponin I: <0.03 ng/mL (ref <0.03)

## 2016-11-28 LAB — MAGNESIUM: MAGNESIUM: 2.1 mg/dL (ref 1.7–2.4)

## 2016-11-28 LAB — D-DIMER, QUANTITATIVE: D-Dimer, Quant: 0.36 ug/mL-FEU (ref 0.00–0.50)

## 2016-11-28 MED ORDER — POTASSIUM CHLORIDE CRYS ER 20 MEQ PO TBCR
40.0000 meq | EXTENDED_RELEASE_TABLET | Freq: Once | ORAL | Status: AC
  Administered 2016-11-28: 40 meq via ORAL
  Filled 2016-11-28: qty 2

## 2016-11-28 MED ORDER — ACETAMINOPHEN 650 MG RE SUPP: 650.0000 mg | Freq: Four times a day (QID) | RECTAL | Status: DC | PRN

## 2016-11-28 MED ORDER — AMLODIPINE BESYLATE 5 MG PO TABS
5.0000 mg | ORAL_TABLET | Freq: Every day | ORAL | Status: DC
  Administered 2016-11-28 – 2016-11-29 (×2): 5 mg via ORAL
  Filled 2016-11-28 (×2): qty 1

## 2016-11-28 MED ORDER — PANTOPRAZOLE SODIUM 40 MG PO TBEC
40.0000 mg | DELAYED_RELEASE_TABLET | Freq: Every day | ORAL | Status: DC
  Administered 2016-11-28 – 2016-11-29 (×2): 40 mg via ORAL
  Filled 2016-11-28 (×2): qty 1

## 2016-11-28 MED ORDER — ASPIRIN EC 81 MG PO TBEC
81.0000 mg | DELAYED_RELEASE_TABLET | Freq: Every day | ORAL | Status: DC
  Administered 2016-11-28 – 2016-11-29 (×2): 81 mg via ORAL
  Filled 2016-11-28 (×2): qty 1

## 2016-11-28 MED ORDER — METOPROLOL TARTRATE 25 MG PO TABS
25.0000 mg | ORAL_TABLET | Freq: Two times a day (BID) | ORAL | Status: DC
  Administered 2016-11-28 – 2016-11-29 (×2): 25 mg via ORAL
  Filled 2016-11-28 (×3): qty 1

## 2016-11-28 MED ORDER — ACETAMINOPHEN 325 MG PO TABS
650.0000 mg | ORAL_TABLET | Freq: Four times a day (QID) | ORAL | Status: DC | PRN
  Administered 2016-11-28: 650 mg via ORAL
  Filled 2016-11-28: qty 2

## 2016-11-28 MED ORDER — ROSUVASTATIN CALCIUM 40 MG PO TABS
40.0000 mg | ORAL_TABLET | Freq: Every day | ORAL | Status: DC
  Administered 2016-11-28 – 2016-11-29 (×2): 40 mg via ORAL
  Filled 2016-11-28 (×2): qty 1

## 2016-11-28 MED ORDER — HYDROCHLOROTHIAZIDE 12.5 MG PO CAPS
12.5000 mg | ORAL_CAPSULE | Freq: Every day | ORAL | Status: DC
  Administered 2016-11-28 – 2016-11-29 (×2): 12.5 mg via ORAL
  Filled 2016-11-28 (×2): qty 1

## 2016-11-28 MED ORDER — DIAZEPAM 5 MG PO TABS
2.5000 mg | ORAL_TABLET | Freq: Every day | ORAL | Status: DC
  Administered 2016-11-29: 2.5 mg via ORAL
  Filled 2016-11-28: qty 1

## 2016-11-28 MED ORDER — ENOXAPARIN SODIUM 40 MG/0.4ML ~~LOC~~ SOLN
40.0000 mg | SUBCUTANEOUS | Status: DC
  Administered 2016-11-28: 40 mg via SUBCUTANEOUS
  Filled 2016-11-28 (×2): qty 0.4

## 2016-11-28 MED ORDER — CLOPIDOGREL BISULFATE 75 MG PO TABS
75.0000 mg | ORAL_TABLET | Freq: Every day | ORAL | Status: DC
  Administered 2016-11-28 (×2): 75 mg via ORAL
  Filled 2016-11-28 (×2): qty 1

## 2016-11-28 MED ORDER — METOPROLOL TARTRATE 12.5 MG HALF TABLET: 12.5000 mg | ORAL_TABLET | Freq: Two times a day (BID) | ORAL | Status: DC

## 2016-11-28 NOTE — Progress Notes (Signed)
Hannah [name] is a 71 y.o. female with a past medical history significant for HTN, hyperlipidemia and PVD who presents with chest pain. Reports chest pain intermittent, both at rest and with activity , substernal. Radiating to the jaw and and left arm, with palpitations, going on for a few weeks. Negative enzymes and echo pending.  EKG IS NSR.  Plan for NST and cardiology consult and let her eat today.  Kathlen Mody, MD [phone]

## 2016-11-28 NOTE — Consult Note (Signed)
Cardiology Consultation:   Patient ID: Hannah [name]; [id number]; [birth year]   Admit date: 11/27/2016 Date of Consult: 11/28/2016  Primary Care Provider: Ralene Ok, MD Primary Cardiologist: Allyson Sabal    Patient Profile:   Hannah [name] is a 71 y.o. 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 Blake Divine, Hospitalist.   History of Present Illness:   Ms. Hannah [name] presented to the emergency room with complaints of chest discomfort. She states over the last several days she's been having increasing palpitations, with worsening symptoms of chest discomfort, jaw pain, dyspnea, headache, dizziness, and nausea. As result of worsening symptoms the patient went to see urgent care center, and had labs and chest x-ray completed along with EKG. She states that they told her everything was normal but because of her recurrent symptoms they suggested that she come to the emergency room.  On arrival to the emergency room patient's blood pressure was 143/71, heart rate 71, O2 sat 100%, she was afebrile. EKG revealed normal sinus rhythm heart rate of 74 bpm without acute ST T wave abnormalities. Troponin negative 2. Pertinent lab hemoglobin 11.5 hematocrit 35.6, potassium 3.2, sodium 139, creatinine 0.85. Chest x-ray revealed no cardiopulmonary disease. She was treated with 324 mg aspirin and nitroglycerin sublingual.  Since admission, the patient has been given potassium replacement 40 mEq X 1.  Since arrival, the patient's chest discomfort has improved but she continues to have intermittent episodes of rapid heart rhythm. Of note, the patient had been seen in our office by Johnella Moloney, PA and was  plan for cardiac monitor to be placed this Tuesday.  Past Medical History:  Diagnosis Date  . Family history of heart disease   . Hyperlipidemia   . Hypertension   . 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 ABI - BILATERAL  2008   moderate arterial insufficiency at rest; pulsatile flow of bilateral ankle PVRs  . APPENDECTOMY  11/21/2002   acute appendicitis  . LEFT HEART CATHETERIZATION WITH CORONARY ANGIOGRAM N/A 11/10/2011   Procedure: LEFT HEART CATHETERIZATION WITH CORONARY ANGIOGRAM;  Surgeon: Runell Gess, MD;  Location: [facility] CATH LAB;  Service: Cardiovascular;  Laterality: N/A; - normal left main/LAD/L Cfx/RCA  . NM MYOCAR PERF WALL MOTION  10/2011   lexiscan - EF71%, normal perfusion; low risk scan  . ROTATOR CUFF REPAIR  1995  . TRANSTHORACIC ECHOCARDIOGRAM  10/2011   EF=>55%, calcified moderator band in the RV; borderline LA enlargement; mild MR; mild TR; trace AV regurg & pulm valve regurg     Home Medications:  Prior to Admission medications   Medication Sig Start Date End Date Taking? Authorizing Provider  amLODipine (NORVASC) 5 MG tablet Take 5 mg by mouth daily. 07/23/16  Yes [provider]  aspirin EC 81 MG tablet Take 81 mg by mouth daily.   Yes [provider]  clopidogrel (PLAVIX) 75 MG tablet Take 75 mg by mouth at bedtime.    Yes [provider]  DEXILANT 60 MG capsule Take 60 mg by mouth daily. 08/25/15  Yes [provider]  diazepam (VALIUM) 5 MG tablet Take 2.5 mg by mouth daily.  06/01/13  Yes [provider]  hydrochlorothiazide (MICROZIDE) 12.5 MG capsule Take 12.5 mg by mouth daily.   Yes [provider]  metoprolol tartrate (LOPRESSOR) 25 MG tablet TAKE 1 TABLET(25 MG) BY MOUTH TWICE DAILY 11/05/16  Yes Runell Gess, MD  rosuvastatin (CRESTOR) 40 MG tablet Take 40 mg by mouth daily.   Yes [provider]  Coenzyme Q-10 200 MG CAPS Take 1 capsule by  mouth daily.    [provider]  pramipexole (MIRAPEX) 0.125 MG tablet 1 pill each night x 1 week, then 2 pills each night x 1 week, then 3 pills each night thereafter. Take 90-120 minutes before bedtime. 11/10/16   Huston Foley, MD    Inpatient Medications: Scheduled Meds: . amLODipine  5 mg Oral Daily  . aspirin EC  81 mg Oral Daily  . clopidogrel  75 mg Oral QHS  . diazepam  2.5 mg Oral Daily  . enoxaparin (LOVENOX) injection  40 mg Subcutaneous Q24H  . hydrochlorothiazide  12.5 mg Oral Daily  . pantoprazole  40 mg Oral Daily  . rosuvastatin  40 mg Oral Daily   Continuous Infusions:  PRN Meds: acetaminophen **OR** acetaminophen  Allergies:    Allergies  Allergen Reactions  . Amoxicillin Swelling  . Ramipril Swelling    Angioedema     Social History:   Social History   Social History  . Marital status: Married    Spouse name: N/A  . Number of children: 1  . Years of education: 51   Occupational History  . Not on file.   Social History Main Topics  . Smoking status: Former Games developer  . Smokeless tobacco: Never Used  . Alcohol use Yes     Comment: occasional  . Drug use: No  . Sexual activity: Not on file   Other Topics Concern  . Not on file   Social History Narrative  . No narrative on file    Family History:    Family History  Problem Relation Age of Onset  . Heart disease Father   . Hypertension Father   . Cancer Father   . Heart attack Father        in 30s  . Hypertension Brother        2 with CABG  . Stroke Sister   . Hypertension Sister   . Heart Problems Child        born with enlarged heart     ROS:  Please see the history of present illness.  ROS  All other ROS reviewed and negative.     Physical Exam/Data:   Vitals:   11/27/16 2330 11/28/16 0132 11/28/16 0530 11/28/16 0853  BP: (!) 156/65 (!) 175/57 (!) 143/53 (!) 135/55  Pulse: (!) 52 (!) 56 63 (!) 56  Resp: 20  20 18   Temp:  98.1 F (36.7 C) 98 F (36.7 C) 98 F  (36.7 C)  TempSrc:  Oral Oral Oral  SpO2: 97% 98% 100% 100%  Weight:  127 lb (57.6 kg)    Height:        Intake/Output Summary (Last 24 hours) at 11/28/16 1153 Last data filed at 11/28/16 0600  Gross per 24 hour  Intake              120 ml  Output              200 ml  Net              -80 ml  Filed Weights   11/27/16 1657 11/28/16 0132  Weight: 128 lb (58.1 kg) 127 lb (57.6 kg)   Body mass index is 23.23 kg/m.  General:  Well nourished, well developed, in no acute distress, complaining of intermittent episodes of rapid heart rhythm HEENT: normal Lymph: no adenopathy Neck: no JVD Endocrine:  No thryomegaly Vascular: No carotid bruits; FA pulses 2+ bilaterally without bruits  Cardiac:  normal S1, S2; RRR; no murmur Lungs:  clear to auscultation bilaterally, no wheezing, rhonchi or rales  Abd: soft, nontender, no hepatomegaly  Ext: no edema Musculoskeletal:  No deformities, BUE and BLE strength normal and equal Skin: warm and dry  Neuro:  CNs 2-12 intact, no focal abnormalities noted Psych:  Normal affect   EKG:  The EKG was personally reviewed and demonstrates:  Normal sinus rhythm heart rate 74 bpm, QTC 433 ms. Telemetry:  Telemetry was personally reviewed and demonstrates: Intermittent episodes of PSVT heart rate of 120 bpm-150 bpm.  Relevant CV Studies: Cardiac Cath 11/10/2011 ANGIOGRAPHIC RESULTS:   1. Left main; normal  2. LAD; normal 3. Left circumflex; normal.  4. Right coronary artery; nondominant and normal 5. Left ventriculography; RAO left ventriculogram was performed using  25 mL Without wall motion abnormalities.  6. 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 The iliac arteries reconstituted by lumbar and IMA collaterals.  IMPRESSION:Ms. Altice had normal coronary arteries and normal left foot function. I believe her stress test was false positive or symptoms noncardiac. 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 It would be a good candidate for aortobifemoral bypass grafting for lifestyle in the claudication.   Laboratory Data:  Chemistry Recent Labs Lab 11/27/16 1724  NA 139  K 3.2*  CL 103  CO2 28  GLUCOSE 90  BUN 14  CREATININE 0.85  CALCIUM 9.1  GFRNONAA >60  GFRAA >60  ANIONGAP 8    Hematology Recent Labs Lab 11/27/16 1724  WBC 5.3  RBC 4.16  HGB 11.5*  HCT 35.6*  MCV 85.6  MCH 27.6  MCHC 32.3  RDW 14.8  PLT 354   Cardiac Enzymes Recent Labs Lab 11/27/16 1721 11/28/16 0521  TROPONINI <0.03 <0.03   No results for input(s): TROPIPOC in the last 168 hours.  BNPNo results for input(s): BNP, PROBNP in the last 168 hours.  DDimer  Recent Labs Lab 11/28/16 0521  DDIMER 0.36    Radiology/Studies:  Dg Chest 2 View  Result Date: 11/27/2016 CLINICAL DATA:  Chest pain for 2 days, initial encounter EXAM: CHEST  2 VIEW COMPARISON:  04/10/2016 FINDINGS: Cardiac shadow is stable. The lungs are well aerated bilaterally. No focal infiltrate or sizable effusion is seen. No acute bony abnormality is noted. IMPRESSION: No active cardiopulmonary disease. Electronically Signed   By: Alcide Clever M.D.   On: 11/27/2016 17:49    Assessment and Plan:   1. PSVT: Review of telemetry reveals episodes of PSVT heart rates 120 230 bpm while at rest. The patient is currently not on rate control indications, will begin low-dose metoprolol 25 mg twice a day, as blood pressure is low normal. Review of telemetry does have her in normal sinus rhythm in the 60's-70's otherwise. This had not been restarted on admission. Echocardiogram has been ordered.  2. Hypokalemia: This has been repleted with by mouth potassium, the patient is on hydrochlorothiazide 12.5 mg at home without potassium replacement. Recommend daily potassium replacement. Magnesium 2.1 on admission.  3. History of hypertension: On amlodipine 5 mg daily, with HCTZ 12.5  mg daily.  Blood pressure is well-controlled.  4. Hypercholesterolemia: On Crestor 40 mg daily.   5. PAD: On Plavix. Occluded aorta with reconstitution through iliac.  Signed, Joni Reining DNP, ANP, AACC   11/28/2016 11:53 AM     Attending Note:   The patient was seen and examined.  Agree with assessment and plan as noted above.  Changes made to the above note as needed.  Patient seen and independently examined with Joni Reining.   We discussed all aspects of the encounter. I agree with the assessment and plan as stated above.  1.   Atrial tach:  We are restarting her metoprolol She has salvos of atrial  Tach -  Ive reassured her that these appear to be benign.  2.  HTN   :  Continue meds.  Replace potassium   3. PAD : severe, aortic occlusion     I have spent a total of 40 minutes with patient reviewing hospital  notes , telemetry, EKGs, labs and examining patient as well as establishing an assessment and plan that was discussed with the patient. > 50% of time was spent in direct patient care.    Vesta Mixer, Montez Hageman., MD, [facility] 11/28/2016, 3:27 PM [address]  [address] Office [phone] Pager [phone]

## 2016-11-28 NOTE — Progress Notes (Signed)
Pt is stable, vitals stable, no any complain of pain and SOB, resumed to [facility] diet from NPO this am, will continue to monitor

## 2016-11-28 NOTE — H&P (Addendum)
History and Physical  Patient Name: Hannah [name]     ZOX:[id number]    DOB: [birth year]    DOA: 11/27/2016 PCP: Ralene Ok, MD   Patient coming from: Home --> [facility]     Chief Complaint: Chest pain  HPI: SHEVAWN [name] is a 71 y.o. female with a past medical history significant for HTN, hyperlipidemia and PVD who presents with chest pain.  The patient was in her usual state of health until about a month ago she started to have palpitations. These are episodes of "heart fluttering" or "vibrating", last for 15-20 minutes at a time, happen throughout the day, and are not associated with dizziness or SOB.  She saw her cardiology office about this a week ago, was recommended to pick up a 30-day event monitor, which she hasn't picked up yet, and is worried about wearing anyway, because it isn't monitored in real time, won't be read until the end of the month, so she is afraid if something were to happen to her in the meantime.  Now in the last week, she has had episodes of nonexertional chest pain.  These happen with light exertion (like putting her clothes on), usually in the morning, are associated with jaw pain, radiating down the arm, SOB, and blurry vision.  She usually sits down and they eventually pass.  She has two flights of stairs in her home that she climbs "20 times a day" and she has had no change recently in her ability to climb these flights of stairs.  ED course: -Afebrile, heart rate 71, respirations and pulse ox normal, BP 143/71 -Initial ECG showed normal sinus rhythm and troponin was negative. -Na 139, K 3.2, Cr 0.85, WBC 5, Hgb 11.5 -CXR clear -TRH was asked to admit for observation, serial troponins and risk stratification.     Review of Systems:  Review of Systems  Constitutional: Negative for diaphoresis, fever and malaise/fatigue.  Eyes: Positive for blurred vision.  Respiratory: Positive for shortness of breath. Negative for cough and sputum production.     Cardiovascular: Positive for chest pain, palpitations and claudication (chronic). Negative for orthopnea, leg swelling and PND.  Gastrointestinal: Negative for nausea.  Neurological: Negative for weakness.  All other systems reviewed and are negative.    Past Medical History:  Diagnosis Date  . Family history of heart disease   . Hyperlipidemia   . Hypertension   . 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 ABI - BILATERAL  2008   moderate arterial insufficiency at rest; pulsatile flow of bilateral ankle PVRs  . APPENDECTOMY  11/21/2002   acute appendicitis  . LEFT HEART CATHETERIZATION WITH CORONARY ANGIOGRAM N/A 11/10/2011   Procedure: LEFT HEART CATHETERIZATION WITH CORONARY ANGIOGRAM;  Surgeon: Runell Gess, MD;  Location: [facility] CATH LAB;  Service: Cardiovascular;  Laterality: N/A; - normal left main/LAD/L Cfx/RCA  . NM MYOCAR PERF WALL MOTION  10/2011   lexiscan - EF71%, normal perfusion; low risk scan  . ROTATOR CUFF REPAIR  1995  . TRANSTHORACIC ECHOCARDIOGRAM  10/2011   EF=>55%, calcified moderator band in the RV; borderline LA enlargement; mild MR; mild TR; trace AV regurg & pulm valve regurg    Social History: Patient lives with her husband.  Patient walks unassisted.  She is a remote former smoker.  From [employer].  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    Allergies  Allergen Reactions  . Amoxicillin Swelling  .  Ramipril Swelling    Angioedema     Family history: family history includes Cancer in her father; Heart Problems in her child; Heart attack in her father; Heart disease in her father; Hypertension in her brother, father, and sister; Stroke in her sister.  Prior to Admission medications   Medication Sig Start Date End Date Taking? Authorizing Provider  amLODipine (NORVASC) 5 MG tablet Take 5 mg by mouth daily. 07/23/16  Yes [provider]  aspirin EC 81 MG tablet Take 81 mg by mouth daily.    Yes [provider]  clopidogrel (PLAVIX) 75 MG tablet Take 75 mg by mouth at bedtime.    Yes [provider]  DEXILANT 60 MG capsule Take 60 mg by mouth daily. 08/25/15  Yes [provider]  diazepam (VALIUM) 5 MG tablet Take 2.5 mg by mouth daily.  06/01/13  Yes [provider]  hydrochlorothiazide (MICROZIDE) 12.5 MG capsule Take 12.5 mg by mouth daily.   Yes [provider]  metoprolol tartrate (LOPRESSOR) 25 MG tablet TAKE 1 TABLET(25 MG) BY MOUTH TWICE DAILY 11/05/16  Yes Runell Gess, MD  rosuvastatin (CRESTOR) 40 MG tablet Take 40 mg by mouth daily.   Yes [provider]  Coenzyme Q-10 200 MG CAPS Take 1 capsule by mouth daily.    [provider]  pramipexole (MIRAPEX) 0.125 MG tablet 1 pill each night x 1 week, then 2 pills each night x 1 week, then 3 pills each night thereafter. Take 90-120 minutes before bedtime. 11/10/16   Huston Foley, MD       Physical Exam: BP (!) 175/57 (BP Location: Left Arm)   Pulse (!) 56   Temp 98.1 F (36.7 C) (Oral)   Resp 20   Ht 5\' 2"  (1.575 m)   Wt 57.6 kg (127 lb)   SpO2 98%   BMI 23.23 kg/m  General appearance: Well-developed, adult female, alert and in no acute distress.   Eyes: Anicteric, conjunctiva pink, lids and lashes normal.     ENT: No nasal deformity, discharge, or epistaxis.  OP moist without lesions.   Skin: Warm and dry.   Cardiac: RRR, nl S1-S2, no murmurs appreciated.  Capillary refill is brisk.  JVP normal.  No LE edema.  Radial and DP pulses 2+ and symmetric.  No carotid bruits. Respiratory: Normal respiratory rate and rhythm.  CTAB without rales or wheezes. GI: Abdomen soft without rigidity.  No TTP. No ascites, distension.   MSK: No deformities or effusions.   Pain not reproduced with palpation of precordium.  No pain with arm movement. Neuro: Sensorium intact and responding to questions, attention normal.  Speech is fluent.  Moves all extremities equally and  with normal coordination.    Psych: Behavior appropriate.  Affect anxious.  No evidence of aural or visual hallucinations or delusions.       Labs on Admission:  The metabolic panel shows normal electrolyes and renal function. The complete blood count shows normal WBCs, Hgb, platelets. The initial troponin is negative.  Radiological Exams on Admission: Personally reviewed CXR shows no focal opacity or edema: Dg Chest 2 View  Result Date: 11/27/2016 CLINICAL DATA:  Chest pain for 2 days, initial encounter EXAM: CHEST  2 VIEW COMPARISON:  04/10/2016 FINDINGS: Cardiac shadow is stable. The lungs are well aerated bilaterally. No focal infiltrate or sizable effusion is seen. No acute bony abnormality is noted. IMPRESSION: No active cardiopulmonary disease. Electronically Signed   By: Eulah Pont.D.  On: 11/27/2016 17:49    EKG: Independently reviewed. Rate 79, QTc 483, no change from previous.    Assessment/Plan  1. Chest pain in setting of existing PAD: This is new.  The patient has HEART score of 4. Angina is atypical, nonexertional. Will rule out PE.  Other potential causes of chest pain (dissection, pancreatitis, pneumonia/effusion, pericarditis) are doubted.  We have been asked to admit the patient for observation and etiology consultation with Cardiology tomorrow.  -Serial troponins are ordered -Check Dimer -Consult to cardiology, appreciate recommendations   2. Hypokalemia:  -Oral K -Check mag  3. Hypertension:  -Hold metop for stress -Continue amlodipine, HCTZ  4. Other medications:  -Continue PPI -Continue Valium  5. Anemia: Mild, normocytic.   -Follow up with PCP      DVT prophylaxis: Lovenox Diet: NPO after 4am for anticipated stress testing Code Status: FULL  Family Communication: Husband at bedside  Disposition Plan: Anticipate overnight observation for arrhythmia on telemetry, serial troponins and subsequent  risk stratification by Cardiology.  If  testing negative, home after. Consults called: None overnight Admission status: Telemetry   Medical decision making: Patient seen at 2:10 AM on 11/28/2016. What exists of the patient's chart was reviewed in depth.  Clinical condition: stable.      Alberteen Sam [facility] Pager [phone]

## 2016-11-29 ENCOUNTER — Observation Stay (HOSPITAL_COMMUNITY): Payer: Medicare Other

## 2016-11-29 DIAGNOSIS — D649 Anemia, unspecified: Secondary | ICD-10-CM | POA: Diagnosis present

## 2016-11-29 DIAGNOSIS — R072 Precordial pain: Secondary | ICD-10-CM | POA: Diagnosis present

## 2016-11-29 DIAGNOSIS — I10 Essential (primary) hypertension: Secondary | ICD-10-CM | POA: Diagnosis not present

## 2016-11-29 DIAGNOSIS — I110 Hypertensive heart disease with heart failure: Secondary | ICD-10-CM | POA: Diagnosis present

## 2016-11-29 DIAGNOSIS — Z9114 Patient's other noncompliance with medication regimen: Secondary | ICD-10-CM | POA: Diagnosis not present

## 2016-11-29 DIAGNOSIS — Z888 Allergy status to other drugs, medicaments and biological substances status: Secondary | ICD-10-CM | POA: Diagnosis not present

## 2016-11-29 DIAGNOSIS — E785 Hyperlipidemia, unspecified: Secondary | ICD-10-CM | POA: Diagnosis present

## 2016-11-29 DIAGNOSIS — R002 Palpitations: Secondary | ICD-10-CM

## 2016-11-29 DIAGNOSIS — Z87891 Personal history of nicotine dependence: Secondary | ICD-10-CM | POA: Diagnosis not present

## 2016-11-29 DIAGNOSIS — Z7982 Long term (current) use of aspirin: Secondary | ICD-10-CM | POA: Diagnosis not present

## 2016-11-29 DIAGNOSIS — Z881 Allergy status to other antibiotic agents status: Secondary | ICD-10-CM | POA: Diagnosis not present

## 2016-11-29 DIAGNOSIS — I471 Supraventricular tachycardia: Secondary | ICD-10-CM | POA: Diagnosis not present

## 2016-11-29 DIAGNOSIS — E876 Hypokalemia: Secondary | ICD-10-CM | POA: Diagnosis present

## 2016-11-29 DIAGNOSIS — R0789 Other chest pain: Secondary | ICD-10-CM | POA: Diagnosis present

## 2016-11-29 DIAGNOSIS — I340 Nonrheumatic mitral (valve) insufficiency: Secondary | ICD-10-CM | POA: Diagnosis not present

## 2016-11-29 DIAGNOSIS — I509 Heart failure, unspecified: Secondary | ICD-10-CM | POA: Diagnosis present

## 2016-11-29 DIAGNOSIS — Z79899 Other long term (current) drug therapy: Secondary | ICD-10-CM | POA: Diagnosis not present

## 2016-11-29 DIAGNOSIS — I7409 Other arterial embolism and thrombosis of abdominal aorta: Secondary | ICD-10-CM | POA: Diagnosis present

## 2016-11-29 DIAGNOSIS — Z8249 Family history of ischemic heart disease and other diseases of the circulatory system: Secondary | ICD-10-CM | POA: Diagnosis not present

## 2016-11-29 DIAGNOSIS — Z7902 Long term (current) use of antithrombotics/antiplatelets: Secondary | ICD-10-CM | POA: Diagnosis not present

## 2016-11-29 DIAGNOSIS — E7800 Pure hypercholesterolemia, unspecified: Secondary | ICD-10-CM | POA: Diagnosis present

## 2016-11-29 DIAGNOSIS — I739 Peripheral vascular disease, unspecified: Secondary | ICD-10-CM | POA: Diagnosis present

## 2016-11-29 LAB — ECHOCARDIOGRAM COMPLETE
Height: 62 in
Weight: 2006.4 oz

## 2016-11-29 NOTE — Progress Notes (Signed)
*  PRELIMINARY RESULTS* Echocardiogram 2D Echocardiogram has been performed.  Hannah [name] 11/29/2016, 4:16 PM

## 2016-11-29 NOTE — Discharge Summary (Signed)
Physician Discharge Summary  Hannah [name] WJX:[id number] DOB: [birth year] DOA: 11/27/2016  PCP: Ralene Ok, MD  Admit date: 11/27/2016 Discharge date: 11/29/2016  Admitted From: Home.  Disposition:  Home.  Recommendations for Outpatient Follow-up:  1. Follow up with PCP in 1-2 weeks 2. Please obtain BMP/CBC in one week Please follow up with cardiology as scheduled   Discharge Condition:stable.  CODE STATUS: full code Diet recommendation: Heart Healthy  Brief/Interim Summary: Hannah [name] Burnsis a 71 y.o.femalewith a past medical history significant for HTN, hyperlipidemia and PVDwho presents with chest pain. Reports chest pain intermittent, both at rest and with activity , substernal. Currently chest pain free. Cardiology consulted and recommendations given. Echocardiogram ordered, reviewed and cardiology cleared her for discharge.   Discharge Diagnoses:  Principal Problem:   Chest pain Active Problems:   Peripheral arterial disease (HCC)   Essential hypertension   Palpitations   Normocytic anemia  PSVT:  Restart metoprolol, ? Event monitor.  Follow up with cardiology.    Chest pain : resolved.  Atypical, cardiac enzymes negative and EKG does not show any ischemic changes. Echocardiogram shows Normal LV systolic function; mild diastolic dysfunction; mild MR and TR, grade 1 diastolic dysfunction.    Hypertension:  Stable.     Hyperlipidemia.  Resume statin.    PAD:  ON plavix, resume .      Discharge Instructions  Discharge Instructions    Diet - low sodium heart healthy    Complete by:  As directed    Discharge instructions    Complete by:  As directed    Please follow up with cardiology as recommended.  Please follow up with your PCP in one week.     Allergies as of 11/29/2016      Reactions   Amoxicillin Swelling   Ramipril Swelling   Angioedema      Medication List    TAKE these medications   amLODipine 5 MG tablet Commonly known as:   NORVASC Take 5 mg by mouth daily.   aspirin EC 81 MG tablet Take 81 mg by mouth daily as needed for mild pain.   clopidogrel 75 MG tablet Commonly known as:  PLAVIX Take 75 mg by mouth at bedtime.   Coenzyme Q-10 200 MG Caps Take 1 capsule by mouth daily.   DEXILANT 60 MG capsule Generic drug:  dexlansoprazole Take 60 mg by mouth daily.   diazepam 5 MG tablet Commonly known as:  VALIUM Take 2.5 mg by mouth daily.   hydrochlorothiazide 12.5 MG capsule Commonly known as:  MICROZIDE Take 12.5 mg by mouth daily.   metoprolol tartrate 25 MG tablet Commonly known as:  LOPRESSOR TAKE 1 TABLET(25 MG) BY MOUTH TWICE DAILY   pramipexole 0.125 MG tablet Commonly known as:  MIRAPEX 1 pill each night x 1 week, then 2 pills each night x 1 week, then 3 pills each night thereafter. Take 90-120 minutes before bedtime.   rosuvastatin 40 MG tablet Commonly known as:  CRESTOR Take 40 mg by mouth daily.      Follow-up Information    Ralene Ok, MD. Schedule an appointment as soon as possible for a visit on 12/06/2016.   Specialty:  Internal Medicine Why:  @2pm  Contact information: [address] [facility] [address] [phone]          Allergies  Allergen Reactions  . Amoxicillin Swelling  . Ramipril Swelling    Angioedema     Consultations:  Cardiology.    Procedures/Studies: Dg Chest 2  View  Result Date: 11/27/2016 CLINICAL DATA:  Chest pain for 2 days, initial encounter EXAM: CHEST  2 VIEW COMPARISON:  04/10/2016 FINDINGS: Cardiac shadow is stable. The lungs are well aerated bilaterally. No focal infiltrate or sizable effusion is seen. No acute bony abnormality is noted. IMPRESSION: No active cardiopulmonary disease. Electronically Signed   By: Alcide Clever M.D.   On: 11/27/2016 17:49    Echocardiogram.    Subjective: No new complaints, wants to go home.   Discharge Exam: Vitals:   11/29/16 0838 11/29/16 1054  BP: 128/61 (!) 154/61  Pulse: 71 (!) 58   Resp: 18 20  Temp: 98.2 F (36.8 C) (!) 97.5 F (36.4 C)  SpO2: 100%    Vitals:   11/29/16 0005 11/29/16 0508 11/29/16 0838 11/29/16 1054  BP: 133/69 (!) 135/53 128/61 (!) 154/61  Pulse: (!) 58 (!) 56 71 (!) 58  Resp: 18 18 18 20   Temp: 97.9 F (36.6 C) 97.8 F (36.6 C) 98.2 F (36.8 C) (!) 97.5 F (36.4 C)  TempSrc: Oral Oral Oral Oral  SpO2: 97% 100% 100%   Weight:  56.9 kg (125 lb 6.4 oz)    Height:        General: Pt is alert, awake, not in acute distress Cardiovascular: RRR, S1/S2 +, no rubs, no gallops Respiratory: CTA bilaterally, no wheezing, no rhonchi Abdominal: Soft, NT, ND, bowel sounds + Extremities: no edema, no cyanosis    The results of significant diagnostics from this hospitalization (including imaging, microbiology, ancillary and laboratory) are listed below for reference.     Microbiology: No results found for this or any previous visit (from the past 240 hour(s)).   Labs: BNP (last 3 results) No results for input(s): BNP in the last 8760 hours. Basic Metabolic Panel:  Recent Labs Lab 11/27/16 1724 11/28/16 0521 11/28/16 1123  NA 139  --  141  K 3.2*  --  3.9  CL 103  --  109  CO2 28  --  25  GLUCOSE 90  --  94  BUN 14  --  9  CREATININE 0.85  --  0.80  CALCIUM 9.1  --  9.0  MG  --  2.1  --    Liver Function Tests: No results for input(s): AST, ALT, ALKPHOS, BILITOT, PROT, ALBUMIN in the last 168 hours. No results for input(s): LIPASE, AMYLASE in the last 168 hours. No results for input(s): AMMONIA in the last 168 hours. CBC:  Recent Labs Lab 11/27/16 1724  WBC 5.3  NEUTROABS 2.9  HGB 11.5*  HCT 35.6*  MCV 85.6  PLT 354   Cardiac Enzymes:  Recent Labs Lab 11/27/16 1721 11/28/16 0521 11/28/16 1123  TROPONINI <0.03 <0.03 <0.03   BNP: Invalid input(s): POCBNP CBG: No results for input(s): GLUCAP in the last 168 hours. D-Dimer  Recent Labs  11/28/16 0521  DDIMER 0.36   Hgb A1c No results for input(s):  HGBA1C in the last 72 hours. Lipid Profile No results for input(s): CHOL, HDL, LDLCALC, TRIG, CHOLHDL, LDLDIRECT in the last 72 hours. Thyroid function studies No results for input(s): TSH, T4TOTAL, T3FREE, THYROIDAB in the last 72 hours.  Invalid input(s): FREET3 Anemia work up No results for input(s): VITAMINB12, FOLATE, FERRITIN, TIBC, IRON, RETICCTPCT in the last 72 hours. Urinalysis No results found for: COLORURINE, APPEARANCEUR, LABSPEC, PHURINE, GLUCOSEU, HGBUR, BILIRUBINUR, KETONESUR, PROTEINUR, UROBILINOGEN, NITRITE, LEUKOCYTESUR Sepsis Labs Invalid input(s): PROCALCITONIN,  WBC,  LACTICIDVEN Microbiology No results found for this or any previous visit (  from the past 240 hour(s)).   Time coordinating discharge: Over 30 minutes  SIGNED:   Kathlen Mody, MD  [facility] 11/30/2016, 8:35 AM Pager   If 7PM-7AM, please contact night-coverage [email or web address] Password [email or web address]

## 2016-11-29 NOTE — Progress Notes (Signed)
Pt is alert and orient ECHO being performed poss discharge today

## 2016-11-29 NOTE — Progress Notes (Signed)
Progress Note  Patient Name: Hannah [name] Date of Encounter: 11/29/2016  Primary Cardiologist: Dr. Allyson Sabal  Subjective   Pt did not take pm dose of metoprolol due to H/A which she has when BP elevated or HR low, HR was in the 50s.  It seems she holds at home as well.  She was having break through tachycardia at home even with BB.  Inpatient Medications    Scheduled Meds: . amLODipine  5 mg Oral Daily  . aspirin EC  81 mg Oral Daily  . clopidogrel  75 mg Oral QHS  . diazepam  2.5 mg Oral Daily  . enoxaparin (LOVENOX) injection  40 mg Subcutaneous Q24H  . hydrochlorothiazide  12.5 mg Oral Daily  . metoprolol tartrate  25 mg Oral BID  . pantoprazole  40 mg Oral Daily  . rosuvastatin  40 mg Oral Daily   Continuous Infusions:  PRN Meds: acetaminophen **OR** acetaminophen   Vital Signs    Vitals:   11/28/16 1943 11/28/16 2200 11/29/16 0005 11/29/16 0508  BP: (!) 122/57  133/69 (!) 135/53  Pulse: (!) 58 (!) 54 (!) 58 (!) 56  Resp: 18  18 18   Temp: 98 F (36.7 C)  97.9 F (36.6 C) 97.8 F (36.6 C)  TempSrc: Oral  Oral Oral  SpO2: 100%  97% 100%  Weight:    125 lb 6.4 oz (56.9 kg)  Height:        Intake/Output Summary (Last 24 hours) at 11/29/16 0856 Last data filed at 11/29/16 0400  Gross per 24 hour  Intake              460 ml  Output             1625 ml  Net            -1165 ml   Filed Weights   11/27/16 1657 11/28/16 0132 11/29/16 0508  Weight: 128 lb (58.1 kg) 127 lb (57.6 kg) 125 lb 6.4 oz (56.9 kg)    Telemetry    SR to SB at 54 and burst of atrial tach  - Personally Reviewed  ECG    No new - Personally Reviewed  Physical Exam   GEN: No acute distress.   Neck: No JVD Cardiac: RRR, no murmurs, rubs, or gallops.  Respiratory: Clear to auscultation bilaterally. GI: Soft, nontender, non-distended  MS: No edema; No deformity. Neuro:  Nonfocal  Psych: Normal affect   Labs    Chemistry Recent Labs Lab 11/27/16 1724 11/28/16 1123  NA 139  141  K 3.2* 3.9  CL 103 109  CO2 28 25  GLUCOSE 90 94  BUN 14 9  CREATININE 0.85 0.80  CALCIUM 9.1 9.0  GFRNONAA >60 >60  GFRAA >60 >60  ANIONGAP 8 7     Hematology Recent Labs Lab 11/27/16 1724  WBC 5.3  RBC 4.16  HGB 11.5*  HCT 35.6*  MCV 85.6  MCH 27.6  MCHC 32.3  RDW 14.8  PLT 354    Cardiac Enzymes Recent Labs Lab 11/27/16 1721 11/28/16 0521 11/28/16 1123  TROPONINI <0.03 <0.03 <0.03   No results for input(s): TROPIPOC in the last 168 hours.   BNPNo results for input(s): BNP, PROBNP in the last 168 hours.   DDimer  Recent Labs Lab 11/28/16 0521  DDIMER 0.36     Radiology    Dg Chest 2 View  Result Date: 11/27/2016 CLINICAL DATA:  Chest pain for 2 days, initial encounter EXAM: CHEST  2 VIEW COMPARISON:  04/10/2016 FINDINGS: Cardiac shadow is stable. The lungs are well aerated bilaterally. No focal infiltrate or sizable effusion is seen. No acute bony abnormality is noted. IMPRESSION: No active cardiopulmonary disease. Electronically Signed   By: Alcide Clever M.D.   On: 11/27/2016 17:49    Cardiac Studies   Cardiac event monitor had been ordered, not yet placed.   Patient Profile     71 y.o. 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  Low dose metoprolol 25 mg BID her home dose was resumed.  The 2200 dose was held --last episode was at noon yesterday--echo has been ordered.   --she is supposed to have 30 day event monitor placed tomorrow ? Continue with these plans.   Hypokalemia replaced K+ 3.9 today and Mg+ 2.1  Chest pain with neg troponins X 3. No further pain.  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 Agree with above.  Having bursts of ATACH. Benign. Discussed  RRR, alert, CTAB  ATACH  - no need for 30 day event monitor. We caught the arrhythmia here.  - Continue with metoprolol  - If BP low, may need to stop amlodipine  - HR in the 50 is tolerable.   CP  - no further issues. Comfortable. No further ischemic testing at this time  - checking ECHO  PAD  - Plavix  Donato Schultz, MD

## 2016-11-29 NOTE — Progress Notes (Signed)
Pt is alert and oriented states that she feels light headed t- 36.5 b/p120/62 67 02  100%

## 2017-01-11 ENCOUNTER — Other Ambulatory Visit: Payer: Self-pay | Admitting: Internal Medicine

## 2017-01-11 DIAGNOSIS — Z1231 Encounter for screening mammogram for malignant neoplasm of breast: Secondary | ICD-10-CM

## 2017-01-12 ENCOUNTER — Ambulatory Visit
Admission: RE | Admit: 2017-01-12 | Discharge: 2017-01-12 | Disposition: A | Discharge status: Home / Self Care | Payer: Medicare Other | Source: Ambulatory Visit | Attending: Internal Medicine | Admitting: Internal Medicine

## 2017-01-12 DIAGNOSIS — Z1231 Encounter for screening mammogram for malignant neoplasm of breast: Secondary | ICD-10-CM

## 2017-01-14 ENCOUNTER — Ambulatory Visit (INDEPENDENT_AMBULATORY_CARE_PROVIDER_SITE_OTHER): Payer: Medicare Other | Admitting: Cardiovascular Disease

## 2017-01-14 ENCOUNTER — Encounter: Payer: Self-pay | Admitting: Cardiovascular Disease

## 2017-01-14 ENCOUNTER — Other Ambulatory Visit: Payer: Self-pay | Admitting: Internal Medicine

## 2017-01-14 VITALS — BP 123/72 | HR 73 | Ht 62.0 in | Wt 129.6 lb

## 2017-01-14 DIAGNOSIS — I739 Peripheral vascular disease, unspecified: Secondary | ICD-10-CM | POA: Diagnosis not present

## 2017-01-14 DIAGNOSIS — R928 Other abnormal and inconclusive findings on diagnostic imaging of breast: Secondary | ICD-10-CM

## 2017-01-14 DIAGNOSIS — E7800 Pure hypercholesterolemia, unspecified: Secondary | ICD-10-CM | POA: Diagnosis not present

## 2017-01-14 DIAGNOSIS — R002 Palpitations: Secondary | ICD-10-CM | POA: Diagnosis not present

## 2017-01-14 DIAGNOSIS — R0789 Other chest pain: Secondary | ICD-10-CM

## 2017-01-14 DIAGNOSIS — I10 Essential (primary) hypertension: Secondary | ICD-10-CM | POA: Diagnosis not present

## 2017-01-14 NOTE — Assessment & Plan Note (Addendum)
History of chest pain was reread recent admission for rule out MI. Her enzymes were negative. Her 2-D echo was normal and her EKG showed no acute changes. I did do a heart catheterization on her's 11/10/11 revealed normal coronary arteries and normal LV function. Her symptoms sounded fairly typical of angina however. I am going to get a pharmacologic Myoview stress test to risk stratify her.

## 2017-01-14 NOTE — Progress Notes (Signed)
01/14/2017 Hannah [name]   [birth year]  [id number]  Primary Physician Ralene Ok, MD Primary Cardiologist: Runell Gess MD Nicholes Calamity, [name]  HPI:  Hannah [name] is a 71 y.o. female thin-appearing married African American female, mother of 1 child, who I last saw 06/08/16.Marland Kitchen She has a history of normal coronary arteries by catheterization, which I performed November 10, 2011. 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 She really denies significant lifestyle-limiting claudication. Her other problems include remote tobacco abuse, having quit over 20 years ago, treated hypertension, hyperlipidemia, as well as a strong family history for heart disease with a father who had an MI in his 60s and 2 brothers who have had bypass surgery. Since I saw her a year ago , she did have some cramping in her right calf however venous Dopplers were negative for DVT. 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 She ALSO gets occasional morning palpitations and has worn an event monitor in the past which was unrevealing.She continues to have some demented palpitations. She was recently admitted to the hospital with chest pain on 11/27/16 and discharged 2 days later. Her enzymes were negative. Cardiology consult was obtained. 2-D echo was normal and her EKG showed no acute changes.   Current Meds  Medication Sig  . amLODipine (NORVASC) 5 MG tablet Take 5 mg by mouth daily.  Marland Kitchen aspirin EC 81 MG tablet Take 81 mg by mouth daily as needed for mild pain.   Marland Kitchen clopidogrel (PLAVIX) 75 MG tablet Take 75 mg by mouth at bedtime.   . Coenzyme Q-10 200 MG CAPS Take 1 capsule by mouth daily.  Marland Kitchen DEXILANT 60 MG capsule Take 60 mg by mouth daily.  . diazepam (VALIUM) 5 MG tablet Take 2.5 mg by  mouth daily.   . hydrochlorothiazide (MICROZIDE) 12.5 MG capsule Take 12.5 mg by mouth daily.  . metoprolol tartrate (LOPRESSOR) 25 MG tablet TAKE 1 TABLET(25 MG) BY MOUTH TWICE DAILY  . potassium chloride (K-DUR,KLOR-CON) 10 MEQ tablet Take 1,020 mEq by mouth daily.  . pramipexole (MIRAPEX) 0.125 MG tablet 1 pill each night x 1 week, then 2 pills each night x 1 week, then 3 pills each night thereafter. Take 90-120 minutes before bedtime.  . rosuvastatin (CRESTOR) 40 MG tablet Take 40 mg by mouth daily.  . [DISCONTINUED] nitroGLYCERIN (NITROSTAT) 0.4 MG SL tablet Place 0.4 mg under the tongue every 5 (five) minutes as needed for chest pain.     Allergies  Allergen Reactions  . Amoxicillin Swelling  . Ramipril Swelling    Angioedema     Social History   Social History  . Marital status: Married    Spouse name: N/A  . Number of children: 1  . Years of education: 70   Occupational History  . Not on file.   Social History Main Topics  . Smoking status: Former Games developer  . Smokeless tobacco: Never Used  . Alcohol use Yes     Comment: occasional  . Drug use: No  . Sexual activity: Not on file   Other Topics Concern  . Not on file   Social History Narrative  . No narrative on file     Review of Systems: General: negative for chills, fever, night sweats or weight changes.  Cardiovascular:  negative for chest pain, dyspnea on exertion, edema, orthopnea, palpitations, paroxysmal nocturnal dyspnea or shortness of breath Dermatological: negative for rash Respiratory: negative for cough or wheezing Urologic: negative for hematuria Abdominal: negative for nausea, vomiting, diarrhea, bright red blood per rectum, melena, or hematemesis Neurologic: negative for visual changes, syncope, or dizziness All other systems reviewed and are otherwise negative except as noted above.    Blood pressure 123/72, pulse 73, height  (1.575 m), weight 129 lb 9.6 oz (58.8 kg).  General  appearance: alert and no distress Neck: no adenopathy, no carotid bruit, no JVD, supple, symmetrical, trachea midline and thyroid not enlarged, symmetric, no tenderness/mass/nodules Lungs: clear to auscultation bilaterally Heart: regular rate and rhythm, S1, S2 normal, no murmur, click, rub or gallop Extremities: extremities normal, atraumatic, no cyanosis or edema Pulses: Diminished pedal pulses bilaterally Skin: Skin color, texture, turgor normal. No rashes or lesions Neurologic: Alert and oriented X 3, normal strength and tone. Normal symmetric reflexes. 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  Essential hypertension History of essential hypertension the blood pressure measures 123/72. She is on amlodipine, hydrochlorothiazide and metoprolol. Continue current meds are current dosing  Hyperlipidemia History of hyperlipidemia on high-dose statin therapy with recent lipid profile performed 11/17/16 revealing total cholesterol 185, LDL 104 and HDL of 68.  Chest pain History of chest pain was reread recent admission for rule out MI. Her enzymes were negative. Her 2-D echo was normal and her EKG showed no acute changes. I did do a heart catheterization on her's 11/10/11 revealed normal coronary arteries and normal LV function. Her symptoms sounded fairly typical of angina however. I am going to get a pharmacologic Myoview stress test to risk stratify her.  Palpitations History of symptomatic palpitations on beta blocker. We will obtain a 2 week event monitor to further evaluate.      Runell Gess MD FACP,FACC,FAHA, [facility] 01/14/2017 2:19 PM

## 2017-01-14 NOTE — Patient Instructions (Signed)
Medication Instructions: Your physician recommends that you continue on your current medications as directed. Please refer to the Current Medication list given to you today.  Testing/Procedures: Your physician has recommended that you wear a 14 day event monitor. Event monitors are medical devices that record the heart's electrical activity. Doctors most often [place] these monitors to diagnose arrhythmias. Arrhythmias are problems with the speed or rhythm of the heartbeat. The monitor is a small, portable device. You can wear one while you do your normal daily activities. This is usually used to diagnose what is causing palpitations/syncope (passing out).  Your physician has requested that you have a lexiscan myoview. For further information please visit [email or web address] Please follow instruction sheet, as given.  Follow-Up: We request that you follow-up in: 6 months with an extender and in 12 months with Dr San Morelle will receive a reminder letter in the mail two months in advance. If you don't receive a letter, please call our office to schedule the follow-up appointment.  If you need a refill on your cardiac medications before your next appointment, please call your pharmacy.

## 2017-01-14 NOTE — Assessment & Plan Note (Signed)
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

## 2017-01-14 NOTE — Assessment & Plan Note (Signed)
History of symptomatic palpitations on beta blocker. We will obtain a 2 week event monitor to further evaluate.

## 2017-01-14 NOTE — Assessment & Plan Note (Signed)
History of hyperlipidemia on high-dose statin therapy with recent lipid profile performed 11/17/16 revealing total cholesterol 185, LDL 104 and HDL of 68.

## 2017-01-14 NOTE — Assessment & Plan Note (Signed)
History of essential hypertension the blood pressure measures 123/72. She is on amlodipine, hydrochlorothiazide and metoprolol. Continue current meds are current dosing

## 2017-01-18 ENCOUNTER — Ambulatory Visit
Admission: RE | Admit: 2017-01-18 | Discharge: 2017-01-18 | Disposition: A | Discharge status: Home / Self Care | Payer: Medicare Other | Source: Ambulatory Visit | Attending: Internal Medicine | Admitting: Internal Medicine

## 2017-01-18 ENCOUNTER — Ambulatory Visit: Payer: Medicare Other

## 2017-01-18 DIAGNOSIS — R928 Other abnormal and inconclusive findings on diagnostic imaging of breast: Secondary | ICD-10-CM

## 2017-01-28 ENCOUNTER — Telehealth (HOSPITAL_COMMUNITY): Payer: Self-pay

## 2017-01-28 NOTE — Telephone Encounter (Signed)
Encounter complete. 

## 2017-01-31 ENCOUNTER — Ambulatory Visit (INDEPENDENT_AMBULATORY_CARE_PROVIDER_SITE_OTHER): Payer: Medicare Other

## 2017-01-31 DIAGNOSIS — R002 Palpitations: Secondary | ICD-10-CM | POA: Diagnosis not present

## 2017-01-31 DIAGNOSIS — R0789 Other chest pain: Secondary | ICD-10-CM | POA: Diagnosis not present

## 2017-02-02 ENCOUNTER — Inpatient Hospital Stay (HOSPITAL_COMMUNITY): Admission: RE | Admit: 2017-02-02 | Payer: Medicare Other | Source: Ambulatory Visit

## 2017-02-11 ENCOUNTER — Telehealth (HOSPITAL_COMMUNITY): Payer: Self-pay

## 2017-02-11 NOTE — Telephone Encounter (Signed)
Encounter complete. 

## 2017-02-15 ENCOUNTER — Encounter (HOSPITAL_COMMUNITY): Payer: Medicare Other

## 2017-02-17 ENCOUNTER — Telehealth (HOSPITAL_COMMUNITY): Payer: Self-pay

## 2017-02-17 NOTE — Telephone Encounter (Signed)
Encounter complete. 

## 2017-02-22 ENCOUNTER — Encounter (HOSPITAL_COMMUNITY): Payer: Medicare Other

## 2017-03-02 ENCOUNTER — Telehealth (HOSPITAL_COMMUNITY): Payer: Self-pay

## 2017-03-02 NOTE — Telephone Encounter (Signed)
Encounter complete. 

## 2017-03-08 ENCOUNTER — Inpatient Hospital Stay (HOSPITAL_COMMUNITY)
Admission: RE | Admit: 2017-03-08 | Payer: Medicare Other | Source: Ambulatory Visit | Attending: Cardiovascular Disease | Admitting: Cardiovascular Disease

## 2017-05-17 ENCOUNTER — Ambulatory Visit (INDEPENDENT_AMBULATORY_CARE_PROVIDER_SITE_OTHER): Payer: Medicare Other | Admitting: Adult Health

## 2017-05-17 ENCOUNTER — Encounter: Payer: Self-pay | Admitting: Adult Health

## 2017-05-17 VITALS — BP 134/74 | HR 63 | Ht 62.0 in | Wt 136.2 lb

## 2017-05-17 DIAGNOSIS — G479 Sleep disorder, unspecified: Secondary | ICD-10-CM

## 2017-05-17 DIAGNOSIS — R4586 Emotional lability: Secondary | ICD-10-CM

## 2017-05-17 NOTE — Progress Notes (Addendum)
PATIENT: Hannah [name] DOB: 1946/02/12  REASON FOR VISIT: follow up HISTORY FROM: patient  HISTORY OF PRESENT ILLNESS: Today 05/17/17 Ms. Ziesmer is a 72 year old female with a history of sleep disturbance.  She returns today for follow-up.  At the last visit she was given a trial on Mirapex however she reports that this made her symptoms worse.  She states her sleep has gotten somewhat better.  She states that she was sleepwalking and sleep talking frequently but this has decreased recently.  She does state that she feels that she suffers with depression and anxiety.  She states that she does take Valium in the morning and finds it beneficial for her anxiety.  She has taken this at night but it made her sleep worse.  She also states in the past she was put on clonazepam for sleep issues and this also made her symptoms worse.  She plans to discuss her issues with depression with her primary care provider.  She returns today for evaluation.   HISTORY 11/10/2016: She reports waking up in the mornings with palpitations. Had Holter monitor x 2 w last year in June, with benign results; I reviewed the report.  Has FU with cardiology, was told to try prn extra metoprolol. She is a Product/process development scientist. She wakes up anxious. She worries about her heart. She is wondering if her heart relaxes too much during her sleep and her brain tells it to jump start in the early morning hours. I spent a long time explaining to her that she should not worry about her heart and brain connection that way. She has an appointment next week with cardiology and is encouraged to discuss her symptoms with them as well. She is reassured that she does not have any significant sleep apnea. She still does not sleep well however. She does wake up multiple times in the middle of the night and is able to go back to sleep. She still does not have telltale restless leg symptoms.  The patient's allergies, current medications, family history, past  medical history, past social history, past surgical history and problem list were reviewed and updated as appropriate.   REVIEW OF SYSTEMS: Out of a complete 14 system review of symptoms, the patient complains only of the following symptoms, and all other reviewed systems are negative.  Fatigue, ear pain, ringing in ears, light sensitivity, blurred vision, shortness of breath, palpitations, swollen abdomen, constipation, insomnia, frequent waking, sleep talking, sleepwalking, acting out dreams, back pain, walking difficulty, rash, moles, depression, nervous/anxious, dizziness, anemia, frequency of urination  ALLERGIES: Allergies  Allergen Reactions  . Amoxicillin Swelling  . Ramipril Swelling    Angioedema     HOME MEDICATIONS: Outpatient Medications Prior to Visit  Medication Sig Dispense Refill  . amLODipine (NORVASC) 5 MG tablet Take 5 mg by mouth daily.  3  . aspirin EC 81 MG tablet Take 81 mg by mouth daily as needed for mild pain.     Marland Kitchen clopidogrel (PLAVIX) 75 MG tablet Take 75 mg by mouth at bedtime.     . Coenzyme Q-10 200 MG CAPS Take 1 capsule by mouth daily.    Marland Kitchen DEXILANT 60 MG capsule Take 60 mg by mouth daily.  0  . diazepam (VALIUM) 5 MG tablet Take 2.5 mg by mouth daily.     . hydrochlorothiazide (MICROZIDE) 12.5 MG capsule Take 12.5 mg by mouth daily.    . metoprolol tartrate (LOPRESSOR) 25 MG tablet TAKE 1 TABLET(25 MG) BY MOUTH  TWICE DAILY 60 tablet 7  . potassium chloride (K-DUR,KLOR-CON) 10 MEQ tablet Take 1,020 mEq by mouth daily.    . rosuvastatin (CRESTOR) 40 MG tablet Take 40 mg by mouth daily.    . pramipexole (MIRAPEX) 0.125 MG tablet 1 pill each night x 1 week, then 2 pills each night x 1 week, then 3 pills each night thereafter. Take 90-120 minutes before bedtime. (Patient not taking: Reported on 05/17/2017) 90 tablet 5   No facility-administered medications prior to visit.     PAST MEDICAL HISTORY: Past Medical History:  Diagnosis Date  . Family history  of heart disease   . Hyperlipidemia   . Hypertension   . Leriche syndrome (HCC)     PAST SURGICAL HISTORY: Past Surgical History:  Procedure Laterality Date  . ABI - BILATERAL  2008   moderate arterial insufficiency at rest; pulsatile flow of bilateral ankle PVRs  . APPENDECTOMY  11/21/2002   acute appendicitis  . LEFT HEART CATHETERIZATION WITH CORONARY ANGIOGRAM N/A 11/10/2011   Procedure: LEFT HEART CATHETERIZATION WITH CORONARY ANGIOGRAM;  Surgeon: Runell GessJonathan J Berry, MD;  [facility];  Service: Cardiovascular;  Laterality: N/A; - normal left main/LAD/L Cfx/RCA  . NM MYOCAR PERF WALL MOTION  10/2011   lexiscan - EF71%, normal perfusion; low risk scan  . ROTATOR CUFF REPAIR  1995  . TRANSTHORACIC ECHOCARDIOGRAM  10/2011   EF=>55%, calcified moderator band in the RV; borderline LA enlargement; mild MR; mild TR; trace AV regurg & pulm valve regurg    FAMILY HISTORY: Family History  Problem Relation Age of Onset  . Heart disease Father   . Hypertension Father   . Cancer Father   . Heart attack Father        in 30s  . Hypertension Brother        2 with CABG  . Stroke Sister   . Hypertension Sister   . Heart Problems Child        born with enlarged heart    SOCIAL HISTORY: Social History   Socioeconomic History  . Marital status: Married    Spouse name: Not on file  . Number of children: 1  . Years of education: 6912  . Highest education level: Not on file  Social Needs  . Financial resource strain: Not on file  . Food insecurity - worry: Not on file  . Food insecurity - inability: Not on file  . Transportation needs - medical: Not on file  . Transportation needs - non-medical: Not on file  Occupational History  . Not on file  Tobacco Use  . Smoking status: Former Games developermoker  . Smokeless tobacco: Never Used  Substance and Sexual Activity  . Alcohol use: Yes    Comment: occasional  . Drug use: No  . Sexual activity: Not on file  Other Topics Concern  . Not  on file  Social History Narrative  . Not on file      PHYSICAL EXAM  Vitals:   05/17/17 1109  BP: 134/74  Pulse: 63  Weight: 136 lb 3.2 oz (61.8 kg)  Height: 5\' 2"  (1.575 m)   Body mass index is 24.91 kg/m.  Generalized: Well developed, in no acute distress   Neurological examination  Mentation: Alert oriented to time, place, history taking. Follows all commands speech and language fluent Cranial nerve II-XII: Pupils were equal round reactive to light. Extraocular movements were full, visual field were full on confrontational test. Facial sensation and strength were normal. Uvula  tongue midline. Head turning and shoulder shrug  were normal and symmetric. Motor: The motor testing reveals 5 over 5 strength of all 4 extremities. Good symmetric motor tone is noted throughout.  Sensory: Sensory testing is intact to soft touch on all 4 extremities. No evidence of extinction is noted.  Coordination: Cerebellar testing reveals good finger-nose-finger and heel-to-shin bilaterally.  Gait and station: Gait is normal. Tandem gait is normal. Romberg is negative. No drift is seen.  Reflexes: Deep tendon reflexes are symmetric and normal bilaterally.   DIAGNOSTIC DATA (LABS, IMAGING, TESTING) - I reviewed patient records, labs, notes, testing and imaging myself where available.  Lab Results  Component Value Date   WBC 5.3 11/27/2016   HGB 11.5 (L) 11/27/2016   HCT 35.6 (L) 11/27/2016   MCV 85.6 11/27/2016   PLT 354 11/27/2016      Component Value Date/Time   NA 141 11/28/2016 1123   K 3.9 11/28/2016 1123   CL 109 11/28/2016 1123   CO2 25 11/28/2016 1123   GLUCOSE 94 11/28/2016 1123   BUN 9 11/28/2016 1123   CREATININE 0.80 11/28/2016 1123   CALCIUM 9.0 11/28/2016 1123   GFRNONAA >60 11/28/2016 1123   GFRAA >60 11/28/2016 1123      ASSESSMENT AND PLAN 72 y.o. year old female  has a past medical history of Family history of heart disease, Hyperlipidemia, Hypertension, and  Leriche syndrome (HCC). here with :  1.  Sleep disturbance  The patient tried Mirapex but felt that this made her symptoms worse.  She states overall things have improved slightly within the last several weeks.  For now we will continue to monitor her symptoms.  She is encouraged to speak with her primary care about her mood and potential depression.  She is advised that if her symptoms worsen or she develops new symptoms she should let us know.  She will follow-up with our office on an as-needed basis.     Butch Penny, MSN, NP-C 05/17/2017, 11:20 AM [facility] [address], [address] [place], [place] [address] [phone]  I reviewed the above note and documentation by the Nurse Practitioner and agree with the history, physical exam, assessment and plan as outlined above. I was immediately available for face-to-face consultation. Huston Foley, MD, PhD [facility])

## 2017-05-17 NOTE — Patient Instructions (Signed)
Your Plan:  Continue to monitor symptoms Speak to PCP about depression If your symptoms worsen or you develop new symptoms please let us know.   [facility] you [facility]. I hope we have been able to provide you high quality care today.  You may receive a patient satisfaction survey over the next few weeks. We would appreciate your feedback and comments so that we may continue to improve ourselves and the health of our patients.

## 2017-06-08 ENCOUNTER — Ambulatory Visit (HOSPITAL_COMMUNITY)
Admission: RE | Admit: 2017-06-08 | Discharge: 2017-06-08 | Disposition: A | Discharge status: Home / Self Care | Payer: Medicare Other | Source: Ambulatory Visit | Attending: Cardiology | Admitting: Cardiology

## 2017-06-08 DIAGNOSIS — I739 Peripheral vascular disease, unspecified: Secondary | ICD-10-CM | POA: Diagnosis present

## 2017-06-08 DIAGNOSIS — R9439 Abnormal result of other cardiovascular function study: Secondary | ICD-10-CM | POA: Insufficient documentation

## 2017-06-15 ENCOUNTER — Other Ambulatory Visit: Payer: Self-pay | Admitting: Cardiovascular Disease

## 2017-06-15 DIAGNOSIS — I739 Peripheral vascular disease, unspecified: Secondary | ICD-10-CM

## 2017-07-14 ENCOUNTER — Other Ambulatory Visit: Payer: Self-pay | Admitting: Cardiovascular Disease

## 2017-11-29 ENCOUNTER — Encounter: Payer: Self-pay | Admitting: Cardiovascular Disease

## 2018-01-20 ENCOUNTER — Ambulatory Visit (INDEPENDENT_AMBULATORY_CARE_PROVIDER_SITE_OTHER): Payer: Medicare Other | Admitting: Cardiovascular Disease

## 2018-01-20 ENCOUNTER — Encounter: Payer: Self-pay | Admitting: Cardiovascular Disease

## 2018-01-20 VITALS — BP 112/66 | HR 79 | Ht 62.5 in | Wt 140.8 lb

## 2018-01-20 DIAGNOSIS — R002 Palpitations: Secondary | ICD-10-CM | POA: Diagnosis not present

## 2018-01-20 DIAGNOSIS — I10 Essential (primary) hypertension: Secondary | ICD-10-CM | POA: Diagnosis not present

## 2018-01-20 DIAGNOSIS — E7800 Pure hypercholesterolemia, unspecified: Secondary | ICD-10-CM

## 2018-01-20 DIAGNOSIS — I739 Peripheral vascular disease, unspecified: Secondary | ICD-10-CM | POA: Diagnosis not present

## 2018-01-20 NOTE — Assessment & Plan Note (Signed)
History of essential hypertension her blood pressure measured today at 112/66.  She is on amlodipine and Lopressor.  Continue current meds at current dosing.

## 2018-01-20 NOTE — Assessment & Plan Note (Signed)
History of palpitations with event monitor performed last year which was entirely normal.

## 2018-01-20 NOTE — Progress Notes (Signed)
01/20/2018 Seleta Rhymes   08/07/1945  [id number]  Primary Physician Ralene Ok, MD Primary Cardiologist: Runell Gess MD Milagros Loll, [place], [name]  HPI:  Hannah [name] is a 72 y.o.  thin-appearing married [place] American female, mother of 1 child, who I last saw  01/14/2017.Marland Kitchen She has a history of normal coronary arteries by catheterization, which I performed November 10, 2011. 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 She really denies significant lifestyle-limiting claudication. Her other problems include remote tobacco abuse, having quit over 20 years ago, treated hypertension, hyperlipidemia, as well as a strong family history for heart disease with a father who had an MI in his 73s and 2 brothers who have had bypass surgery. Since I saw her a year ago , she did have some cramping in her right calf however venous Dopplers were negative for DVT. 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 She ALSO gets occasional morning palpitations and has worn an event monitor in the past which was unrevealing. Since I saw her a year ago she is remained stable.  She complains of occasional intermittent chest pain, shortness of breath and palpitations although her work-up has been negative.  She really denies lifestyle limiting claudication.  Dopplers revealed ABIs in the 0.7 range bilaterally.  No outpatient medications have been marked as taking for the 01/20/18 encounter (Office Visit) with Runell Gess, MD.     Allergies  Allergen Reactions  . Amoxicillin Swelling  . Ramipril Swelling    Angioedema     Social History   Socioeconomic History  . Marital status: Married    Spouse name: Not on file  . Number of children: 1  . Years of education: 15  .  Highest education level: Not on file  Occupational History  . Not on file  Social Needs  . Financial resource strain: Not on file  . Food insecurity:    Worry: Not on file    Inability: Not on file  . Transportation needs:    Medical: Not on file    Non-medical: Not on file  Tobacco Use  . Smoking status: Former Games developer  . Smokeless tobacco: Never Used  Substance and Sexual Activity  . Alcohol use: Yes    Comment: occasional  . Drug use: No  . Sexual activity: Not on file  Lifestyle  . Physical activity:    Days per week: Not on file    Minutes per session: Not on file  . Stress: Not on file  Relationships  . Social connections:    Talks on phone: Not on file    Gets together: Not on file    Attends religious service: Not on file    Active member of club or organization: Not on file    Attends meetings of clubs or organizations: Not on file    Relationship status: Not on file  . Intimate partner violence:    Fear of current or ex partner: Not on file    Emotionally abused: Not on file    Physically abused: Not on file    Forced sexual activity: Not on file  Other Topics Concern  . Not on file  Social History Narrative  . Not on file     Review of Systems: General: negative for  chills, fever, night sweats or weight changes.  Cardiovascular: negative for chest pain, dyspnea on exertion, edema, orthopnea, palpitations, paroxysmal nocturnal dyspnea or shortness of breath Dermatological: negative for rash Respiratory: negative for cough or wheezing Urologic: negative for hematuria Abdominal: negative for nausea, vomiting, diarrhea, bright red blood per rectum, melena, or hematemesis Neurologic: negative for visual changes, syncope, or dizziness All other systems reviewed and are otherwise negative except as noted above.    Blood pressure 112/66, pulse 79, height 5' 2.5" (1.588 m), weight 140 lb 12.8 oz (63.9 kg).  General appearance: alert and no distress Neck: no  adenopathy, no carotid bruit, no JVD, supple, symmetrical, trachea midline and thyroid not enlarged, symmetric, no tenderness/mass/nodules Lungs: clear to auscultation bilaterally Heart: regular rate and rhythm, S1, S2 normal, no murmur, click, rub or gallop Extremities: extremities normal, atraumatic, no cyanosis or edema Pulses: Diminished pedal pulses bilaterally Skin: Skin color, texture, turgor normal. No rashes or lesions Neurologic: Alert and oriented X 3, normal strength and tone. Normal symmetric reflexes. Normal coordination and gait  EKG sinus rhythm at 79 without ST or T wave changes.  Personally reviewed this EKG  ASSESSMENT AND PLAN:   Peripheral arterial disease (HCC) History of peripheral arterial disease with angiographically documented Leriche syndrome time of cardiac catheterization which I performed radially 11/10/2011.  Her ABIs are in the 0.7 range although she really denies lifestyle limiting claudication.  Essential hypertension History of essential hypertension her blood pressure measured today at 112/66.  She is on amlodipine and Lopressor.  Continue current meds at current dosing.  Hyperlipidemia History of hyperlipidemia on high-dose Crestor with lipid profile performed by her PCP 11/30/2017 revealing total cholesterol 208, LDL 128 and HDL of 60.      Runell Gess MD FACP,FACC,FAHA, [facility] 01/20/2018 4:16 PM

## 2018-01-20 NOTE — Assessment & Plan Note (Signed)
History of peripheral arterial disease with angiographically documented Leriche syndrome time of cardiac catheterization which I performed radially 11/10/2011.  Her ABIs are in the 0.7 range although she really denies lifestyle limiting claudication.

## 2018-01-20 NOTE — Patient Instructions (Signed)
Medication Instructions:  NO CHANGE If you need a refill on your cardiac medications before your next appointment, please call your pharmacy.   Lab work: If you have labs (blood work) drawn today and your tests are completely normal, you will receive your results only by: . MyChart Message (if you have MyChart) OR . A paper copy in the mail If you have any lab test that is abnormal or we need to change your treatment, we will call you to review the results.  Follow-Up: At [facility], you and your health needs are our priority.  As part of our continuing mission to provide you with exceptional heart care, we have created designated Provider Care Teams.  These Care Teams include your primary Cardiologist (physician) and Advanced Practice Providers (APPs -  Physician Assistants and Nurse Practitioners) who all work together to provide you with the care you need, when you need it. You will need a follow up appointment in 12 months.  Please call our office 2 months in advance to schedule this appointment.  You may see JONATHAN BERRY MD or one of the following Advanced Practice Providers on your designated Care Team:   Luke Kilroy, PA-C Krista Kroeger, PA-C . Callie Goodrich, PA-C     

## 2018-01-20 NOTE — Assessment & Plan Note (Signed)
History of hyperlipidemia on high-dose Crestor with lipid profile performed by her PCP 11/30/2017 revealing total cholesterol 208, LDL 128 and HDL of 60.

## 2018-02-14 ENCOUNTER — Other Ambulatory Visit: Payer: Self-pay | Admitting: Cardiovascular Disease

## 2018-02-14 NOTE — Telephone Encounter (Signed)
Rx request sent to pharmacy.  

## 2018-02-21 ENCOUNTER — Other Ambulatory Visit: Payer: Self-pay | Admitting: Internal Medicine

## 2018-02-21 DIAGNOSIS — Z1231 Encounter for screening mammogram for malignant neoplasm of breast: Secondary | ICD-10-CM

## 2018-03-29 ENCOUNTER — Other Ambulatory Visit: Payer: Self-pay

## 2018-03-29 MED ORDER — METOPROLOL TARTRATE 25 MG PO TABS: ORAL_TABLET | ORAL | 2 refills | Status: DC

## 2018-04-18 ENCOUNTER — Ambulatory Visit: Payer: Medicare Other

## 2018-05-16 ENCOUNTER — Ambulatory Visit
Admission: RE | Admit: 2018-05-16 | Discharge: 2018-05-16 | Disposition: A | Discharge status: Home / Self Care | Payer: Medicare Other | Source: Ambulatory Visit | Attending: Internal Medicine | Admitting: Internal Medicine

## 2018-05-16 DIAGNOSIS — Z1231 Encounter for screening mammogram for malignant neoplasm of breast: Secondary | ICD-10-CM

## 2018-06-07 ENCOUNTER — Other Ambulatory Visit (HOSPITAL_COMMUNITY): Payer: Self-pay | Admitting: Cardiovascular Disease

## 2018-06-07 DIAGNOSIS — I739 Peripheral vascular disease, unspecified: Secondary | ICD-10-CM

## 2018-06-13 ENCOUNTER — Ambulatory Visit (HOSPITAL_COMMUNITY)
Admission: RE | Admit: 2018-06-13 | Discharge: 2018-06-13 | Disposition: A | Discharge status: Home / Self Care | Payer: Medicare Other | Source: Ambulatory Visit | Attending: Cardiology | Admitting: Cardiology

## 2018-06-13 DIAGNOSIS — I739 Peripheral vascular disease, unspecified: Secondary | ICD-10-CM | POA: Diagnosis present

## 2018-06-14 ENCOUNTER — Encounter: Payer: Self-pay | Admitting: Cardiovascular Disease

## 2018-06-14 ENCOUNTER — Ambulatory Visit (INDEPENDENT_AMBULATORY_CARE_PROVIDER_SITE_OTHER): Payer: Medicare Other | Admitting: Cardiovascular Disease

## 2018-06-14 VITALS — BP 132/76 | HR 88 | Ht 62.5 in | Wt 136.0 lb

## 2018-06-14 DIAGNOSIS — R002 Palpitations: Secondary | ICD-10-CM | POA: Diagnosis not present

## 2018-06-14 DIAGNOSIS — Z8249 Family history of ischemic heart disease and other diseases of the circulatory system: Secondary | ICD-10-CM | POA: Diagnosis not present

## 2018-06-14 DIAGNOSIS — R0602 Shortness of breath: Secondary | ICD-10-CM | POA: Diagnosis not present

## 2018-06-14 NOTE — Assessment & Plan Note (Signed)
History of hyperlipidemia on rosuvastatin with recent lipid profile performed by her PCP 05/24/2018 revealing total cholesterol 191, LDL of 112 and HDL of 63.  Based on this, she was started on Zetia in addition and her blood work will be rechecked by her PCP.

## 2018-06-14 NOTE — Assessment & Plan Note (Signed)
History of palpitations with 30-day event monitor performed 01/31/2017 that showed sinus rhythm without arrhythmias.  She complains of increasing palpitations specifically in the morning.  We will put a 2-week ZIO patch on to further evaluate.

## 2018-06-14 NOTE — Assessment & Plan Note (Signed)
History of essential hypertension with blood pressure measured today 132/76.  She is on amlodipine and metoprolol.

## 2018-06-14 NOTE — Progress Notes (Signed)
06/14/2018 Hannah [name]   1945/05/01  [id number]  Primary Physician Ralene Ok, MD Primary Cardiologist: Runell Gess MD Milagros Loll, [place], [name]  HPI:  Hannah [name] is a 73 y.o.   thin-appearing married [place] American female, mother of 1 child, who I last saw  01/20/2018.Marland Kitchen She has a history of normal coronary arteries by catheterization, which I performed November 10, 2011. 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 She really denies significant lifestyle-limiting claudication. Her other problems include remote tobacco abuse, having quit over 20 years ago, treated hypertension, hyperlipidemia, as well as a strong family history for heart disease with a father who had an MI in his 43s and 2 brothers who have had bypass surgery. Since I saw her a year ago , she did have some cramping in her right calf however venous Dopplers were negative for DVT. 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 She ALSO gets occasional morning palpitations and has worn an event monitor in the past which was unrevealing. Since I saw her a year ago she is remained stable.  She complains of occasional intermittent chest pain, shortness of breath and palpitations although her work-up has been negative.  Dopplers revealed ABIs in the 0.7 range bilaterally. Since I saw her 6 months ago she continues to complain of lifestyle limiting claudication although she does not wish surgical revascularization.  Her ABIs performed yesterday were unchanged.  She also complains of shortness of breath and morning palpitations.  She drinks 1 cup of coffee a day.  Prior work-up for palpitations performed years ago with an event monitor showed only sinus rhythm.  Current Meds  Medication Sig    . amLODipine (NORVASC) 5 MG tablet Take 5 mg by mouth daily.  Marland Kitchen aspirin EC 81 MG tablet Take 81 mg by mouth daily as needed for mild pain.   Marland Kitchen clopidogrel (PLAVIX) 75 MG tablet Take 75 mg by mouth at bedtime.   . Coenzyme Q-10 200 MG CAPS Take 1 capsule by mouth daily.  Marland Kitchen DEXILANT 60 MG capsule Take 60 mg by mouth daily.  . diazepam (VALIUM) 5 MG tablet Take 2.5 mg by mouth daily.   Marland Kitchen ezetimibe (ZETIA) 10 MG tablet Take 1 tablet by mouth daily.  . furosemide (LASIX) 20 MG tablet Take 1 tablet by mouth daily.  . metoprolol tartrate (LOPRESSOR) 25 MG tablet TAKE 1 TABLET(25 MG) BY MOUTH TWICE DAILY  . rosuvastatin (CRESTOR) 40 MG tablet Take 40 mg by mouth daily.  . Vitamin D, Ergocalciferol, (DRISDOL) 50000 units CAPS capsule Take 50,000 Units by mouth once a week.     Allergies  Allergen Reactions  . Amoxicillin Swelling  . Ramipril Swelling    Angioedema     Social History   Socioeconomic History  . Marital status: Married    Spouse name: Not on file  . Number of children: 1  . Years of education: 26  . Highest education level: Not on file  Occupational History  . Not on file  Social Needs  . Financial resource strain: Not on file  . Food insecurity:    Worry: Not on file    Inability: Not on file  . Transportation needs:    Medical: Not on file    Non-medical: Not on file  Tobacco Use  . Smoking status: Former Games developer  . Smokeless tobacco: Never Used  Substance and Sexual Activity  . Alcohol use: Yes    Comment: occasional  . Drug use: No  . Sexual activity: Not on file  Lifestyle  . Physical activity:    Days per week: Not on file    Minutes per session: Not on file  . Stress: Not on file  Relationships  . Social connections:    Talks on phone: Not on file    Gets together: Not on file    Attends religious service: Not on file    Active member of club or organization: Not on file    Attends meetings of clubs or organizations: Not on file    Relationship  status: Not on file  . Intimate partner violence:    Fear of current or ex partner: Not on file    Emotionally abused: Not on file    Physically abused: Not on file    Forced sexual activity: Not on file  Other Topics Concern  . Not on file  Social History Narrative  . Not on file     Review of Systems: General: negative for chills, fever, night sweats or weight changes.  Cardiovascular: negative for chest pain, dyspnea on exertion, edema, orthopnea, palpitations, paroxysmal nocturnal dyspnea or shortness of breath Dermatological: negative for rash Respiratory: negative for cough or wheezing Urologic: negative for hematuria Abdominal: negative for nausea, vomiting, diarrhea, bright red blood per rectum, melena, or hematemesis Neurologic: negative for visual changes, syncope, or dizziness All other systems reviewed and are otherwise negative except as noted above.    Blood pressure 132/76, pulse 88, height 5' 2.5" (1.588 m), weight 136 lb (61.7 kg), SpO2 97 %.  General appearance: alert and no distress Neck: no adenopathy, no carotid bruit, no JVD, supple, symmetrical, trachea midline and thyroid not enlarged, symmetric, no tenderness/mass/nodules Lungs: clear to auscultation bilaterally Heart: regular rate and rhythm, S1, S2 normal, no murmur, click, rub or gallop Extremities: extremities normal, atraumatic, no cyanosis or edema Pulses: 2+ and symmetric Skin: Skin color, texture, turgor normal. No rashes or lesions Neurologic: Alert and oriented X 3, normal strength and tone. Normal symmetric reflexes. Normal coordination and gait  EKG not performed today  ASSESSMENT AND PLAN:   Palpitations History of palpitations with 30-day event monitor performed 01/31/2017 that showed sinus rhythm without arrhythmias.  She complains of increasing palpitations specifically in the morning.  We will put a 2-week ZIO patch on to further evaluate.  Peripheral arterial disease (HCC) History  of peripheral arterial disease with Leriche syndrome abdominal aortography at the time of heart cath which I performed 11/10/2011.  Her ABIs are in the 0.7 range bilaterally.  She does complain of lifestyle limiting claudication but does not wish to pursue surgical revascularization at this time.  Essential hypertension History of essential hypertension with blood pressure measured today 132/76.  She is on amlodipine and metoprolol.  Hyperlipidemia History of hyperlipidemia on rosuvastatin with recent lipid profile performed by her PCP 05/24/2018 revealing total cholesterol 191, LDL of 112 and HDL of 63.  Based on this, she was started on Zetia in addition and her blood work will be rechecked by her PCP.      Runell Gess MD FACP,FACC,FAHA, [facility] 06/14/2018 1:44 PM

## 2018-06-14 NOTE — Patient Instructions (Addendum)
Please arrive at the [facility] main entrance of [facility] at xx:xx AM (30-45 minutes prior to test start time)  [facility] [address] [place], [place] [address] [phone]  Proceed to the [facility] Radiology Department (First Floor).  Please follow these instructions carefully (unless otherwise directed):   On the Night Before the Test: . Be sure to Drink plenty of water. . Do not consume any caffeinated/decaffeinated beverages or chocolate 12 hours prior to your test. . Do not take any antihistamines 12 hours prior to your test. . If the patient has contrast allergy: ? Patient will need a prescription for Prednisone and very clear instructions (as follows): 1. Prednisone 50 mg - take 13 hours prior to test 2. Take another Prednisone 50 mg 7 hours prior to test 3. Take another Prednisone 50 mg 1 hour prior to test 4. Take Benadryl 50 mg 1 hour prior to test . Patient must complete all four doses of above prophylactic medications. . Patient will need a ride after test due to Benadryl.  On the Day of the Test: . Drink plenty of water. Do not drink any water within one hour of the test. . Do not eat any food 4 hours prior to the test. . You may take your regular medications prior to the test.  . Take TWO of your metoprolol (Lopressor) [50 MG in total] two hours prior to test. . HOLD Furosemide on the morning of the test.      After the Test: . Drink plenty of water. . After receiving IV contrast, you may experience a mild flushed feeling. This is normal. . On occasion, you may experience a mild rash up to 24 hours after the test. This is not dangerous. If this occurs, you can take Benadryl 25 mg and increase your fluid intake. . If you experience trouble breathing, this can be serious. If it is severe call 911 IMMEDIATELY. If it is mild, please call our office. . If you take any of these medications: Glipizide/Metformin, Avandament, Glucavance,  please do not take 48 hours after completing test.  Labs: Your physician recommends that you return for lab work 1-2 weeks prior to your coronary CTA; CBC AND BMP   Testing/Procedures: Your physician has requested that you have an echocardiogram. Echocardiography is a painless test that uses sound waves to create images of your heart. It provides your doctor with information about the size and shape of your heart and how well your heart's chambers and valves are working. This procedure takes approximately one hour. There are no restrictions for this procedure. [address], [place], [place] [address]   Your physician has recommended that you wear a 14 DAY ZIO-PATCH monitor. The Zio patch cardiac monitor continuously records heart rhythm data for up to 14 days, this is for patients being evaluated for multiple types heart rhythms. For the first 24 hours post application, please avoid getting the Zio monitor wet in the shower or by excessive sweating during exercise. After that, feel free to carry on with regular activities. Keep soaps and lotions away from the ZIO XT Patch.  This will be placed at our [facility] location - [address], [address].        Follow-Up: At [facility], you and your health needs are our priority.  As part of our continuing mission to provide you with exceptional heart care, we have created designated Provider Care Teams.  These Care  Teams include your primary Cardiologist (physician) and Advanced Practice Providers (APPs -  Physician Assistants and Nurse Practitioners) who all work together to provide you with the care you need, when you need it. . You will need a follow up appointment in 6 months with an APP and 12 months with Dr. Allyson Sabal.  Please call our office 2 months in advance to schedule this appointment.  You may see Dr. Allyson Sabal or one of the following Advanced Practice Providers on your designated Care Team:   . Corine Shelter, [place] . Azalee Course,  PA-C . Micah Flesher, PA-C . Joni Reining, DNP . Theodore Demark, PA-C . Judy Pimple, PA-C . Marjie Skiff, PA-C

## 2018-06-14 NOTE — Assessment & Plan Note (Signed)
History of peripheral arterial disease with Leriche syndrome abdominal aortography at the time of heart cath which I performed 11/10/2011.  Her ABIs are in the 0.7 range bilaterally.  She does complain of lifestyle limiting claudication but does not wish to pursue surgical revascularization at this time.

## 2018-06-19 ENCOUNTER — Other Ambulatory Visit: Payer: Self-pay

## 2018-06-19 DIAGNOSIS — I739 Peripheral vascular disease, unspecified: Secondary | ICD-10-CM

## 2018-07-04 ENCOUNTER — Telehealth: Payer: Self-pay

## 2018-07-04 NOTE — Telephone Encounter (Signed)
Called Pt to cancel appt to come in for monitor due to COVID-19...14 day Zio was enrolled to be mailed today.

## 2018-07-05 ENCOUNTER — Other Ambulatory Visit (HOSPITAL_COMMUNITY): Payer: Medicare Other

## 2018-07-12 ENCOUNTER — Ambulatory Visit (INDEPENDENT_AMBULATORY_CARE_PROVIDER_SITE_OTHER): Payer: Medicare Other

## 2018-07-12 DIAGNOSIS — R002 Palpitations: Secondary | ICD-10-CM | POA: Diagnosis not present

## 2018-07-13 ENCOUNTER — Telehealth: Payer: Self-pay | Admitting: Cardiovascular Disease

## 2018-07-13 NOTE — Telephone Encounter (Signed)
Patient called and discussed cancelling elective echocardiogram due to concerns for Covid19 and need for social distancing . Patient stable with no urgent symptoms.  TTE scheduled for 4/15 can be rescheduled for 3-6 months   

## 2018-07-26 ENCOUNTER — Other Ambulatory Visit (HOSPITAL_COMMUNITY): Payer: Medicare Other

## 2018-08-03 ENCOUNTER — Other Ambulatory Visit: Payer: Self-pay

## 2018-08-09 ENCOUNTER — Telehealth: Payer: Self-pay | Admitting: *Deleted

## 2018-08-09 ENCOUNTER — Telehealth: Payer: Self-pay

## 2018-08-09 NOTE — Progress Notes (Signed)
Patient is scheduled to discuss results

## 2018-08-09 NOTE — Telephone Encounter (Signed)
Spoke with patient to get her an later appointment time for May 1 , 2020 with Dr. Allyson Sabal. Her new appointment time is now Aug 11, 2018 at 4:30pm.

## 2018-08-09 NOTE — Telephone Encounter (Signed)
Virtual Visit Pre-Appointment Phone Call  "(Name), I am calling you today to discuss your upcoming appointment. We are currently trying to limit exposure to the virus that causes COVID-19 by seeing patients at home rather than in the office."  1. "What is the BEST phone number to call the day of the visit?" - include this in appointment notes  2. "Do you have or have access to (through a family member/friend) a smartphone with video capability that we can use for your visit?" a. If yes - list this number in appt notes as "cell" (if different from BEST phone #) and list the appointment type as a VIDEO visit in appointment notes b. If no - list the appointment type as a PHONE visit in appointment notes  3. Confirm consent - "In the setting of the current Covid19 crisis, you are scheduled for a PHONE visit with Dr. Allyson SabalBerry on 08/11/2018 at 9:15AM.  Just as we do with many in-office visits, in order for you to participate in this visit, we must obtain consent.  If you'd like, I can send this to your mychart (if signed up) or email for you to review.  Otherwise, I can obtain your verbal consent now.  All virtual visits are billed to your insurance company just like a normal visit would be.  By agreeing to a virtual visit, we'd like you to understand that the technology does not allow for your provider to perform an examination, and thus may limit your provider's ability to fully assess your condition. If your provider identifies any concerns that need to be evaluated in person, we will make arrangements to do so.  Finally, though the technology is pretty good, we cannot assure that it will always work on either your or our end, and in the setting of a video visit, we may have to convert it to a phone-only visit.  In either situation, we cannot ensure that we have a secure connection.  Are you willing to proceed?" STAFF: Did the patient verbally acknowledge consent to telehealth visit? Document YES/NO here:  YES  4. Advise patient to be prepared - "Two hours prior to your appointment, go ahead and check your blood pressure, pulse, oxygen saturation, and your weight (if you have the equipment to check those) and write them all down. When your visit starts, your provider will ask you for this information. If you have an Apple Watch or Kardia device, please plan to have heart rate information ready on the day of your appointment. Please have a pen and paper handy nearby the day of the visit as well."  5. Give patient instructions for MyChart download to smartphone OR Doximity/Doxy.me as below if video visit (depending on what platform provider is using)  6. Inform patient they will receive a phone call 15 minutes prior to their appointment time (may be from unknown caller ID) so they should be prepared to answer    TELEPHONE CALL NOTE  Hannah [name] has been deemed a candidate for a follow-up tele-health visit to limit community exposure during the Covid-19 pandemic. I spoke with the patient via phone to ensure availability of phone/video source, confirm preferred email & phone number, and discuss instructions and expectations.  I reminded Hannah [name] to be prepared with any vital sign and/or heart rhythm information that could potentially be obtained via home monitoring, at the time of her visit. I reminded Hannah [name] to expect a phone call prior to her visit.  Dorris Fetch, CMA 08/09/2018 11:10 AM   INSTRUCTIONS FOR DOWNLOADING THE MYCHART APP TO SMARTPHONE  - The patient must first make sure to have activated MyChart and know their login information - If Apple, go to [employer] and type in MyChart in the search bar and download the app. If Android, ask patient to go to [employer] and type in [place] in the search bar and download the app. The app is free but as with any other app downloads, their phone may require them to verify saved payment information or  Apple/Android password.  - The patient will need to then log into the app with their MyChart username and password, and select [facility] as their healthcare provider to link the account. When it is time for your visit, go to the MyChart app, find appointments, and click Begin Video Visit. Be sure to Select Allow for your device to access the Microphone and Camera for your visit. You will then be connected, and your provider will be with you shortly.  **If they have any issues connecting, or need assistance please contact MyChart service desk [phone])**  **If using a computer, in order to ensure the best quality for their visit they will need to use either of the following Internet Browsers: [employer] or Google Chrome**  IF USING DOXIMITY or DOXY.ME - The patient will receive a link just prior to their visit by text.     FULL LENGTH CONSENT FOR TELE-HEALTH VISIT   I hereby voluntarily request, consent and authorize [facility] and its employed or contracted physicians, physician assistants, nurse practitioners or other licensed health care professionals (the Practitioner), to provide me with telemedicine health care services (the "Services") as deemed necessary by the treating Practitioner. I acknowledge and consent to receive the Services by the Practitioner via telemedicine. I understand that the telemedicine visit will involve communicating with the Practitioner through live audiovisual communication technology and the disclosure of certain medical information by electronic transmission. I acknowledge that I have been given the opportunity to request an in-person assessment or other available alternative prior to the telemedicine visit and am voluntarily participating in the telemedicine visit.  I understand that I have the right to withhold or withdraw my consent to the use of telemedicine in the course of my care at any time, without affecting my right to future care  or treatment, and that the Practitioner or I may terminate the telemedicine visit at any time. I understand that I have the right to inspect all information obtained and/or recorded in the course of the telemedicine visit and may receive copies of available information for a reasonable fee.  I understand that some of the potential risks of receiving the Services via telemedicine include:  Marland Kitchen Delay or interruption in medical evaluation due to technological equipment failure or disruption; . Information transmitted may not be sufficient (e.g. poor resolution of images) to allow for appropriate medical decision making by the Practitioner; and/or  . In rare instances, security protocols could fail, causing a breach of personal health information.  Furthermore, I acknowledge that it is my responsibility to provide information about my medical history, conditions and care that is complete and accurate to the best of my ability. I acknowledge that Practitioner's advice, recommendations, and/or decision may be based on factors not within their control, such as incomplete or inaccurate data provided by me or distortions of diagnostic images or specimens that may result from electronic transmissions. I understand that the  practice of medicine is not an Chief Strategy Officer and that Practitioner makes no warranties or guarantees regarding treatment outcomes. I acknowledge that I will receive a copy of this consent concurrently upon execution via email to the email address I last provided but may also request a printed copy by calling the office of [place].    I understand that my insurance will be billed for this visit.   I have read or had this consent read to me. . I understand the contents of this consent, which adequately explains the benefits and risks of the Services being provided via telemedicine.  . I have been provided ample opportunity to ask questions regarding this consent and the Services and have had  my questions answered to my satisfaction. . I give my informed consent for the services to be provided through the use of telemedicine in my medical care  By participating in this telemedicine visit I agree to the above.

## 2018-08-10 ENCOUNTER — Telehealth: Payer: Medicare Other | Admitting: Cardiovascular Disease

## 2018-08-11 ENCOUNTER — Telehealth (INDEPENDENT_AMBULATORY_CARE_PROVIDER_SITE_OTHER): Payer: Medicare Other | Admitting: Cardiovascular Disease

## 2018-08-11 ENCOUNTER — Encounter: Payer: Self-pay | Admitting: Cardiovascular Disease

## 2018-08-11 DIAGNOSIS — R002 Palpitations: Secondary | ICD-10-CM | POA: Diagnosis not present

## 2018-08-11 DIAGNOSIS — I10 Essential (primary) hypertension: Secondary | ICD-10-CM | POA: Diagnosis not present

## 2018-08-11 DIAGNOSIS — I739 Peripheral vascular disease, unspecified: Secondary | ICD-10-CM | POA: Diagnosis not present

## 2018-08-11 DIAGNOSIS — E782 Mixed hyperlipidemia: Secondary | ICD-10-CM

## 2018-08-11 MED ORDER — AMLODIPINE BESYLATE 2.5 MG PO TABS: 2.5000 mg | ORAL_TABLET | Freq: Every day | ORAL | 3 refills | Status: DC

## 2018-08-11 MED ORDER — METOPROLOL TARTRATE 25 MG PO TABS: ORAL_TABLET | ORAL | 3 refills | Status: DC

## 2018-08-11 NOTE — Progress Notes (Signed)
Virtual Visit via Video Note   This visit type was conducted due to national recommendations for restrictions regarding the COVID-19 Pandemic (e.g. social distancing) in an effort to limit this patient's exposure and mitigate transmission in our community.  Due to her co-morbid illnesses, this patient is at least at moderate risk for complications without adequate follow up.  This format is felt to be most appropriate for this patient at this time.  All issues noted in this document were discussed and addressed.  A limited physical exam was performed with this format.  Please refer to the patient's chart for her consent to telehealth for [facility].   Date:  08/11/2018   ID:  Hannah [name], DOB Jun 19, 1945, MRN [id number]  Patient Location: Home Provider Location: Home  PCP:  Ralene Ok, MD  Cardiologist: Dr. Nanetta Batty Electrophysiologist:  None   Evaluation Performed:  Follow-Up Visit  Chief Complaint: Palpitations  History of Present Illness:    Hannah [name] is a 73 y.o.  thin-appearing married [place] American female, mother of 1 child, who I last saw  in the office 06/14/2018... She has a history of normal coronary arteries by catheterization, which I performed November 10, 2011. 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 She really denies significant lifestyle-limiting claudication. Her other problems include remote tobacco abuse, having quit over 20 years ago, treated hypertension, hyperlipidemia, as well as a strong family history for heart disease with a father who had an MI in his 54s and 2 brothers who have had bypass surgery. Since I saw her a year ago , she did have some cramping in her right calf however venous Dopplers were negative for DVT. 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 She ALSO gets occasional morning palpitations and has worn an event monitor in the past which was unrevealing. Since I saw her a year ago she is remained stable. She complains of occasional intermittent chest pain, shortness of breath and palpitations although her work-up has been negative. Dopplers revealed ABIs in the 0.7 range bilaterally. Since I saw her 6 months ago she continues to complain of lifestyle limiting claudication although she does not wish surgical revascularization.  Her ABIs performed yesterday were unchanged.  She also complains of shortness of breath and morning palpitations.  She drinks 1 cup of coffee a day.  Prior work-up for palpitations performed years ago with an event monitor showed only sinus rhythm.  Recent lower extremity arterial Doppler studies performed 06/13/2018 revealed a right ABI 0.69 and a left ABI 0.74.  Recent event monitor performed 08/03/2018 revealed episodes of PSVT and nonsustained ventricular tachycardia.  Her last 2D echo performed 11/29/2016 was essentially normal.  I am going to adjust her beta-blocker and calcium channel blocker, and recheck a 2D echocardiogram.  The patient does not have symptoms concerning for COVID-19 infection (fever, chills, cough, or new shortness of breath).    Past Medical History:  Diagnosis Date   Family history of heart disease    Hyperlipidemia    Hypertension    Leriche syndrome [facility])    Past Surgical History:  Procedure Laterality Date   ABI - BILATERAL  2008   moderate arterial insufficiency at rest; pulsatile flow of bilateral ankle PVRs   APPENDECTOMY  11/21/2002   acute appendicitis  LEFT HEART CATHETERIZATION WITH CORONARY ANGIOGRAM N/A 11/10/2011   Procedure: LEFT HEART CATHETERIZATION WITH CORONARY ANGIOGRAM;  Surgeon: Runell Gess, MD;  Location: [facility] C[facility] LAB;  Service: Cardiovascular;  Laterality: N/A; - normal left main/LAD/L  Cfx/RCA   NM MYOCAR PERF WALL MOTION  10/2011   lexiscan - EF71%, normal perfusion; low risk scan   ROTATOR CUFF REPAIR  1995   TRANSTHORACIC ECHOCARDIOGRAM  10/2011   EF=>55%, calcified moderator band in the RV; borderline LA enlargement; mild MR; mild TR; trace AV regurg & pulm valve regurg     Current Meds  Medication Sig   amLODipine (NORVASC) 5 MG tablet Take 5 mg by mouth daily.   aspirin EC 81 MG tablet Take 81 mg by mouth daily as needed for mild pain.    clopidogrel (PLAVIX) 75 MG tablet Take 75 mg by mouth at bedtime.    Coenzyme Q-10 200 MG CAPS Take 1 capsule by mouth daily.   DEXILANT 60 MG capsule Take 60 mg by mouth daily.   diazepam (VALIUM) 5 MG tablet Take 2.5 mg by mouth daily.    ezetimibe (ZETIA) 10 MG tablet Take 1 tablet by mouth daily.   furosemide (LASIX) 20 MG tablet Take 1 tablet by mouth daily.   metoprolol tartrate (LOPRESSOR) 25 MG tablet TAKE 1 TABLET(25 MG) BY MOUTH TWICE DAILY   rosuvastatin (CRESTOR) 40 MG tablet Take 40 mg by mouth daily.   Vitamin D, Ergocalciferol, (DRISDOL) 50000 units CAPS capsule Take 50,000 Units by mouth once a week.     Allergies:   Amoxicillin and Ramipril   Social History   Tobacco Use   Smoking status: Former Smoker   Smokeless tobacco: Never Used  Substance Use Topics   Alcohol use: Yes    Comment: occasional   Drug use: No     Family Hx: The patient's family history includes Cancer in her father; Heart Problems in her child; Heart attack in her father; Heart disease in her father; Hypertension in her brother, father, and sister; Stroke in her sister.  ROS:   Please see the history of present illness.     All other systems reviewed and are negative.   Prior CV studies:   The following studies were reviewed today:  Lower extremity arterial Doppler studies, event monitor  Labs/Other Tests and Data Reviewed:    EKG:  No ECG reviewed.  Recent Labs: No results found for requested labs  within last 8760 hours.   Recent Lipid Panel No results found for: CHOL, TRIG, HDL, CHOLHDL, LDLCALC, LDLDIRECT  Wt Readings from Last 3 Encounters:  08/11/18 136 lb (61.7 kg)  06/14/18 136 lb (61.7 kg)  01/20/18 140 lb 12.8 oz (63.9 kg)     Objective:    Vital Signs:  BP (!) 143/69    Pulse 75    Ht 5' 2.5" (1.588 m)    Wt 136 lb (61.7 kg)    BMI 24.48 kg/m    VITAL SIGNS:  reviewed GEN:  no acute distress RESPIRATORY:  normal respiratory effort, symmetric expansion NEURO:  alert and oriented x 3, no obvious focal deficit PSYCH:  normal affect  ASSESSMENT & PLAN:    1. Palpitations- Hannah [name] has had palpitations for several years.  She has had an event monitor in the past that showed sinus rhythm.  Recent event monitor performed 08/03/2018 shows episodes of PSVT and nonsustained ventricular tachycardia.  She is on low-dose beta-blockade.  She does drink a couple coffee a  day.  The palpitations are principally in the morning when she wakes up in her bed better during the daytime hours after she is active.  I am going to cut her amlodipine in half, and add an additional 25 mg of metoprolol in the morning.  We will obtain her most recent lab work to assess her electrolytes and thyroid function tests.  I am also going to repeat a 2D echocardiogram and to make sure that her LV function is still normal. 2. Essential hypertension- history of essential hypertension with blood pressure measured by the patient today at 130/72 with a pulse of 96.  She is on amlodipine and metoprolol. 3. Hyperlipidemia-on Zetia and Crestor followed by her PCP 4. Peripheral arterial disease-history of Leriche syndrome with ABIs in the 0.7 range.  She does get some claudication when walking up a hill but does cardio exercise at home without symptoms and does not wish to pursue surgical revascularization at this time.  COVID-19 Education: The signs and symptoms of COVID-19 were discussed with the patient and how to  seek care for testing (follow up with PCP or arrange E-visit).  The importance of social distancing was discussed today.  Time:   Today, I have spent 15 minutes with the patient with telehealth technology discussing the above problems.     Medication Adjustments/Labs and Tests Ordered: Current medicines are reviewed at length with the patient today.  Concerns regarding medicines are outlined above.   Tests Ordered: 2D echocardiogram  Medication Changes: Decrease amlodipine from 5 to 2.5 mg, add an additional 25 mg of metoprolol in the morning making her dose 50 mg in the morning and 25 mg in the evening.  Disposition:  Follow up in 3 month(s)  Signed, Nanetta BattyJonathan Alisia Vanengen, MD  08/11/2018 4:45 PM    [facility]

## 2018-08-11 NOTE — Patient Instructions (Addendum)
Medication Instructions:  Your physician has recommended you make the following change in your medication:  DECREASE YOUR AMLODIPINE TO 2.5 MG BY MOUTH DAILY  TAKE TWO TABLETS (50 MG) OF YOUR METOPROLOL TARTRATE IN THE MORNING AND ONE TABLET (25 MG) IN THE EVENING. If you need a refill on your cardiac medications before your next appointment, please call your pharmacy.   Lab work: NONE If you have labs (blood work) drawn today and your tests are completely normal, you will receive your results only by: Marland Kitchen MyChart Message (if you have MyChart) OR . A paper copy in the mail If you have any lab test that is abnormal or we need to change your treatment, we will call you to review the results.  Testing/Procedures: Your physician has requested that you have an echocardiogram. Echocardiography is a painless test that uses sound waves to create images of your heart. It provides your doctor with information about the size and shape of your heart and how well your heart's chambers and valves are working. This procedure takes approximately one hour. There are no restrictions for this procedure. LOCATION: [address], [place], [place] [address] TO BE SCHEDULED FOR A DATE 4-6 WEEKS FROM TODAY. YOU WILL BE CONTACTED BY OUR OFFICE TO SET UP THIS APPOINTMENT.   Follow-Up: At [facility], you and your health needs are our priority.  As part of our continuing mission to provide you with exceptional heart care, we have created designated Provider Care Teams.  These Care Teams include your primary Cardiologist (physician) and Advanced Practice Providers (APPs -  Physician Assistants and Nurse Practitioners) who all work together to provide you with the care you need, when you need it. You will need a follow up appointment in 3 months with an APP to follow up on your palpitations and in 6 months with Dr. Allyson Sabal.  Please call our office 2 months in advance to schedule this appointment.

## 2018-09-14 ENCOUNTER — Other Ambulatory Visit: Payer: Self-pay

## 2018-09-14 DIAGNOSIS — R002 Palpitations: Secondary | ICD-10-CM

## 2018-09-14 DIAGNOSIS — I471 Supraventricular tachycardia: Secondary | ICD-10-CM

## 2018-09-14 DIAGNOSIS — I209 Angina pectoris, unspecified: Secondary | ICD-10-CM

## 2018-09-14 DIAGNOSIS — I472 Ventricular tachycardia: Secondary | ICD-10-CM

## 2018-09-14 DIAGNOSIS — I4729 Other ventricular tachycardia: Secondary | ICD-10-CM

## 2018-09-27 ENCOUNTER — Other Ambulatory Visit: Payer: Self-pay

## 2018-09-27 ENCOUNTER — Ambulatory Visit (HOSPITAL_COMMUNITY)
Admission: RE | Admit: 2018-09-27 | Discharge: 2018-09-27 | Disposition: A | Discharge status: Home / Self Care | Payer: Medicare Other | Source: Ambulatory Visit | Attending: Cardiovascular Disease | Admitting: Cardiovascular Disease

## 2018-09-27 DIAGNOSIS — R002 Palpitations: Secondary | ICD-10-CM | POA: Diagnosis not present

## 2018-09-27 DIAGNOSIS — R079 Chest pain, unspecified: Secondary | ICD-10-CM | POA: Insufficient documentation

## 2018-09-27 DIAGNOSIS — R0602 Shortness of breath: Secondary | ICD-10-CM | POA: Insufficient documentation

## 2018-09-27 DIAGNOSIS — I10 Essential (primary) hypertension: Secondary | ICD-10-CM | POA: Diagnosis not present

## 2018-09-27 DIAGNOSIS — E785 Hyperlipidemia, unspecified: Secondary | ICD-10-CM | POA: Insufficient documentation

## 2018-09-27 DIAGNOSIS — I081 Rheumatic disorders of both mitral and tricuspid valves: Secondary | ICD-10-CM | POA: Insufficient documentation

## 2018-09-27 NOTE — Progress Notes (Signed)
  Echocardiogram 2D Echocardiogram has been performed.  Gary Gabrielsen L Androw 09/27/2018, 3:36 PM

## 2018-10-02 ENCOUNTER — Ambulatory Visit (HOSPITAL_COMMUNITY): Payer: Medicare Other

## 2018-10-18 ENCOUNTER — Ambulatory Visit (HOSPITAL_COMMUNITY): Payer: Medicare Other

## 2018-11-08 ENCOUNTER — Ambulatory Visit (HOSPITAL_COMMUNITY): Payer: Medicare Other

## 2018-12-08 ENCOUNTER — Other Ambulatory Visit (HOSPITAL_COMMUNITY): Payer: Medicare Other

## 2018-12-08 ENCOUNTER — Ambulatory Visit (HOSPITAL_COMMUNITY): Payer: Medicare Other

## 2018-12-20 ENCOUNTER — Other Ambulatory Visit: Payer: Self-pay | Admitting: Cardiology

## 2019-01-17 ENCOUNTER — Telehealth (INDEPENDENT_AMBULATORY_CARE_PROVIDER_SITE_OTHER): Payer: Medicare Other | Admitting: Cardiology

## 2019-01-17 ENCOUNTER — Encounter: Payer: Self-pay | Admitting: Cardiology

## 2019-01-17 ENCOUNTER — Telehealth: Payer: Self-pay

## 2019-01-17 VITALS — BP 135/68 | HR 73 | Ht 62.0 in | Wt 139.0 lb

## 2019-01-17 DIAGNOSIS — R002 Palpitations: Secondary | ICD-10-CM

## 2019-01-17 DIAGNOSIS — I10 Essential (primary) hypertension: Secondary | ICD-10-CM

## 2019-01-17 DIAGNOSIS — Z8249 Family history of ischemic heart disease and other diseases of the circulatory system: Secondary | ICD-10-CM | POA: Insufficient documentation

## 2019-01-17 DIAGNOSIS — R079 Chest pain, unspecified: Secondary | ICD-10-CM

## 2019-01-17 DIAGNOSIS — E785 Hyperlipidemia, unspecified: Secondary | ICD-10-CM

## 2019-01-17 DIAGNOSIS — I739 Peripheral vascular disease, unspecified: Secondary | ICD-10-CM

## 2019-01-17 HISTORY — DX: Family history of ischemic heart disease and other diseases of the circulatory system: Z82.49

## 2019-01-17 NOTE — Assessment & Plan Note (Signed)
Cath 2013

## 2019-01-17 NOTE — Patient Instructions (Signed)
Medication Instructions:  Your physician recommends that you continue on your current medications as directed. Please refer to the Current Medication list given to you today. If you need a refill on your cardiac medications before your next appointment, please call your pharmacy.   Lab work: I will request a copy of your labs from your PCP. If you have labs (blood work) drawn today and your tests are completely normal, you will receive your results only by: Marland Kitchen MyChart Message (if you have MyChart) OR . A paper copy in the mail If you have any lab test that is abnormal or we need to change your treatment, we will call you to review the results.  Testing/Procedures: None  Follow-Up: At [facility], you and your health needs are our priority.  As part of our continuing mission to provide you with exceptional heart care, we have created designated Provider Care Teams.  These Care Teams include your primary Cardiologist (physician) and Advanced Practice Providers (APPs -  Physician Assistants and Nurse Practitioners) who all work together to provide you with the care you need, when you need it. You will need a follow up appointment in 6 months.  Please call our office 2 months in advance to schedule this appointment.  You may see Quay Burow, MD or one of the following Advanced Practice Providers on your designated Care Team:   Kerin Ransom, PA-C Roby Lofts, [place] . Sande Rives, PA-C  Any Other Special Instructions Will Be Listed Below (If Applicable).

## 2019-01-17 NOTE — Assessment & Plan Note (Signed)
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 Some claudication but she declines further work up. ABis moderately decreased March 2020

## 2019-01-17 NOTE — Assessment & Plan Note (Signed)
Echo June 2020- EF 77-03%, diastolic dysfunction Norvasc decreased secondary to edema- B/P drifting up per patient

## 2019-01-17 NOTE — Telephone Encounter (Signed)
Contacted patient to discuss AVS Instructions. Gave patient Luke's recommendations from today's virtual office visit. Informed patient that someone from the scheduling dept will be in contact with them to schedule their follow up appt. Patient voiced understanding and AVS mailed.    

## 2019-01-17 NOTE — Assessment & Plan Note (Signed)
Rare PSVT and NSVT on monitor April 2020-lopressor increased to 25 mg TID

## 2019-01-17 NOTE — Progress Notes (Signed)
Virtual Visit via Telephone Note   This visit type was conducted due to national recommendations for restrictions regarding the COVID-19 Pandemic (e.g. social distancing) in an effort to limit this patient's exposure and mitigate transmission in our community.  Due to her co-morbid illnesses, this patient is at least at moderate risk for complications without adequate follow up.  This format is felt to be most appropriate for this patient at this time.  The patient did not have access to video technology/had technical difficulties with video requiring transitioning to audio format only (telephone).  All issues noted in this document were discussed and addressed.  No physical exam could be performed with this format.  Please refer to the patient's chart for her  consent to telehealth for [facility].   Date:  01/17/2019   ID:  Hannah [name], DOB [birth year], MRN [id number]  Patient Location: Home Provider Location: Home  PCP:  Ralene Ok, MD  Cardiologist:  Nanetta Batty, MD  Electrophysiologist:  None   Evaluation Performed:  Follow-Up Visit  Chief Complaint:  none  History of Present Illness:    Hannah [name] is a pleasant 73 y.o. female with a history of Leriche syndrome by angiogram in 2013. ABI's moderately depressed by doppler March 2020. She has some claudication but at this time does not want further evaluation.    Dr Allyson Sabal saw her this Spring and increased her metoprolol for palpitations.  He also decreased her Norvasc secondary to edema. She has followed up with her PCP and she tells me he is deferring to dr Allyson Sabal for HTN medication adjustment.  The patient says her B/P is "out of control".  She checks her B/P twice daily and says she has a log going back 6 months.  In the last 2-3 weeks she has noted her systolic B/P to be running around 135. She admits she has not been following a low sodium diet.   The patient does not have symptoms concerning for COVID-19  infection (fever, chills, cough, or new shortness of breath).    Past Medical History:  Diagnosis Date  . Family history of heart disease   . Hyperlipidemia   . Hypertension   . Leriche syndrome [facility])    Past Surgical History:  Procedure Laterality Date  . ABI - BILATERAL  2008   moderate arterial insufficiency at rest; pulsatile flow of bilateral ankle PVRs  . APPENDECTOMY  11/21/2002   acute appendicitis  . LEFT HEART CATHETERIZATION WITH CORONARY ANGIOGRAM N/A 11/10/2011   Procedure: LEFT HEART CATHETERIZATION WITH CORONARY ANGIOGRAM;  Surgeon: Runell Gess, MD;  Location: [facility] CATH LAB;  Service: Cardiovascular;  Laterality: N/A; - normal left main/LAD/L Cfx/RCA  . NM MYOCAR PERF WALL MOTION  10/2011   lexiscan - EF71%, normal perfusion; low risk scan  . ROTATOR CUFF REPAIR  1995  . TRANSTHORACIC ECHOCARDIOGRAM  10/2011   EF=>55%, calcified moderator band in the RV; borderline LA enlargement; mild MR; mild TR; trace AV regurg & pulm valve regurg     Current Meds  Medication Sig  . amLODipine (NORVASC) 2.5 MG tablet Take 1 tablet (2.5 mg total) by mouth daily.  Marland Kitchen aspirin EC 81 MG tablet Take 81 mg by mouth daily as needed for mild pain.   Marland Kitchen clopidogrel (PLAVIX) 75 MG tablet Take 75 mg by mouth at bedtime.   Marland Kitchen DEXILANT 60 MG capsule Take 60 mg by mouth daily.  . diazepam (VALIUM) 5 MG tablet Take 2.5 mg by mouth  daily.   . furosemide (LASIX) 20 MG tablet Take 1 tablet by mouth daily.  . metoprolol tartrate (LOPRESSOR) 25 MG tablet TAKE 1 TABLET(25 MG) BY MOUTH TWICE DAILY (Patient taking differently: Take 25 mg by mouth 3 (three) times daily. TAKE 1 TABLET(25 MG) BY THREE TIMES DAILY)  . rosuvastatin (CRESTOR) 40 MG tablet Take 40 mg by mouth daily.  . Vitamin D, Ergocalciferol, (DRISDOL) 50000 units CAPS capsule Take 50,000 Units by mouth once a week.     Allergies:   Amoxicillin and Ramipril   Social History   Tobacco Use  . Smoking status: Former Research scientist (life sciences)  . Smokeless  tobacco: Never Used  Substance Use Topics  . Alcohol use: Yes    Comment: occasional  . Drug use: No     Family Hx: The patient's family history includes Cancer in her father; Heart Problems in her child; Heart attack in her father; Heart disease in her father; Hypertension in her brother, father, and sister; Stroke in her sister.  ROS:   Please see the history of present illness.    All other systems reviewed and are negative.   Prior CV studies:   The following studies were reviewed today:  Echo June 2020- EF 50-55%  Labs/Other Tests and Data Reviewed:    EKG:  No ECG reviewed.  Recent Labs: No results found for requested labs within last 8760 hours.   Recent Lipid Panel No results found for: CHOL, TRIG, HDL, CHOLHDL, LDLCALC, LDLDIRECT  Wt Readings from Last 3 Encounters:  01/17/19 139 lb (63 kg)  08/11/18 136 lb (61.7 kg)  06/14/18 136 lb (61.7 kg)     Objective:    Vital Signs:  BP 135/68   Pulse 73   Ht 5\' 2"  (1.575 m)   Wt 139 lb (63 kg)   BMI 25.42 kg/m    VITAL SIGNS:  reviewed  ASSESSMENT & PLAN:    Palpitations Rare PSVT and NSVT on monitor April 2020-lopressor increased to 25 mg TID  Essential hypertension Echo June 2020- EF 47-65%, diastolic dysfunction Norvasc decreased secondary to edema- B/P drifting up per patient  Peripheral arterial disease ([place]) Leriche syndrome by PVA 2013. Some claudication but she declines further work up. ABis moderately decreased March 2020  Chest pain with normal coronary angiography Cath 2013  Dyslipidemia Zetia added Feb 2020- PCP follows  Plan: No change in Rx for now. Continue to monitor B/P, decrease sodium intake. I have asked for Dr Adela Ports recent labs.   COVID-19 Education: The signs and symptoms of COVID-19 were discussed with the patient and how to seek care for testing (follow up with PCP or arrange E-visit).  The importance of social distancing was discussed today.  Time:   Today, I have  spent 20 minutes with the patient with telehealth technology discussing the above problems.     Medication Adjustments/Labs and Tests Ordered: Current medicines are reviewed at length with the patient today.  Concerns regarding medicines are outlined above.   Tests Ordered: No orders of the defined types were placed in this encounter.   Medication Changes: No orders of the defined types were placed in this encounter.   Follow Up:  In Person Dr Gwenlyn Found in 6 months  Signed, Kerin Ransom, PA-C  01/17/2019 2:55 PM    [place]

## 2019-01-17 NOTE — Assessment & Plan Note (Signed)
Zetia added Feb 2020- PCP follows 

## 2019-06-03 ENCOUNTER — Ambulatory Visit: Payer: Medicare Other | Attending: Internal Medicine

## 2019-06-03 DIAGNOSIS — Z23 Encounter for immunization: Secondary | ICD-10-CM | POA: Insufficient documentation

## 2019-06-03 NOTE — Progress Notes (Signed)
   Covid-19 Vaccination Clinic  Name:  Hannah [name]    MRN: [id number] DOB: January 21, 1946  06/03/2019  Hannah [name] was observed post Covid-19 immunization for 15 minutes without incidence. She was provided with Vaccine Information Sheet and instruction to access the V-Safe system.   Hannah [name] was instructed to call 911 with any severe reactions post vaccine: Marland Kitchen Difficulty breathing  . Swelling of your face and throat  . A fast heartbeat  . A bad rash all over your body  . Dizziness and weakness    Immunizations Administered    Name Date Dose VIS Date Route   Pfizer COVID-19 Vaccine 06/03/2019  2:17 PM 0.3 mL 03/23/2019 Intramuscular   Manufacturer: [employer], [employer]   Lot: [id number]   NDC: [address]-0

## 2019-06-13 ENCOUNTER — Other Ambulatory Visit: Payer: Self-pay

## 2019-06-13 ENCOUNTER — Ambulatory Visit (HOSPITAL_COMMUNITY)
Admission: RE | Admit: 2019-06-13 | Discharge: 2019-06-13 | Disposition: A | Discharge status: Home / Self Care | Payer: Medicare Other | Source: Ambulatory Visit | Attending: Internal Medicine | Admitting: Internal Medicine

## 2019-06-13 ENCOUNTER — Other Ambulatory Visit (HOSPITAL_COMMUNITY): Payer: Self-pay | Admitting: Cardiovascular Disease

## 2019-06-13 DIAGNOSIS — I739 Peripheral vascular disease, unspecified: Secondary | ICD-10-CM

## 2019-06-27 ENCOUNTER — Ambulatory Visit: Payer: Medicare Other | Attending: Internal Medicine

## 2019-06-27 DIAGNOSIS — Z23 Encounter for immunization: Secondary | ICD-10-CM

## 2019-06-27 NOTE — Progress Notes (Signed)
   Covid-19 Vaccination Clinic  Name:  Hannah [name]    MRN: [id number] DOB: [birth year]  06/27/2019  Ms. Hannah [name] was observed post Covid-19 immunization for 15 minutes without incident. She was provided with Vaccine Information Sheet and instruction to access the V-Safe system.   Ms. Hannah [name] was instructed to call 911 with any severe reactions post vaccine: Marland Kitchen Difficulty breathing  . Swelling of face and throat  . A fast heartbeat  . A bad rash all over body  . Dizziness and weakness   Immunizations Administered    Name Date Dose VIS Date Route   Pfizer COVID-19 Vaccine 06/27/2019  2:08 PM 0.3 mL 03/23/2019 Intramuscular   Manufacturer: [employer], [employer]   Lot: [id number]   NDC: [address]-2

## 2019-07-07 ENCOUNTER — Inpatient Hospital Stay (HOSPITAL_BASED_OUTPATIENT_CLINIC_OR_DEPARTMENT_OTHER)
Admission: EM | Admit: 2019-07-07 | Discharge: 2019-07-09 | DRG: 176 | Disposition: A | Discharge status: Home / Self Care | Payer: Medicare Other | Attending: Internal Medicine | Admitting: Internal Medicine

## 2019-07-07 ENCOUNTER — Other Ambulatory Visit: Payer: Self-pay

## 2019-07-07 ENCOUNTER — Emergency Department (HOSPITAL_BASED_OUTPATIENT_CLINIC_OR_DEPARTMENT_OTHER): Payer: Medicare Other

## 2019-07-07 ENCOUNTER — Encounter (HOSPITAL_BASED_OUTPATIENT_CLINIC_OR_DEPARTMENT_OTHER): Payer: Self-pay | Admitting: Emergency Medicine

## 2019-07-07 DIAGNOSIS — Z881 Allergy status to other antibiotic agents status: Secondary | ICD-10-CM | POA: Diagnosis not present

## 2019-07-07 DIAGNOSIS — Z87891 Personal history of nicotine dependence: Secondary | ICD-10-CM

## 2019-07-07 DIAGNOSIS — Z888 Allergy status to other drugs, medicaments and biological substances status: Secondary | ICD-10-CM | POA: Diagnosis not present

## 2019-07-07 DIAGNOSIS — I739 Peripheral vascular disease, unspecified: Secondary | ICD-10-CM | POA: Diagnosis present

## 2019-07-07 DIAGNOSIS — Z20822 Contact with and (suspected) exposure to covid-19: Secondary | ICD-10-CM | POA: Diagnosis present

## 2019-07-07 DIAGNOSIS — Z7982 Long term (current) use of aspirin: Secondary | ICD-10-CM

## 2019-07-07 DIAGNOSIS — I2693 Single subsegmental pulmonary embolism without acute cor pulmonale: Secondary | ICD-10-CM | POA: Diagnosis not present

## 2019-07-07 DIAGNOSIS — Z79899 Other long term (current) drug therapy: Secondary | ICD-10-CM

## 2019-07-07 DIAGNOSIS — Z8249 Family history of ischemic heart disease and other diseases of the circulatory system: Secondary | ICD-10-CM

## 2019-07-07 DIAGNOSIS — R079 Chest pain, unspecified: Secondary | ICD-10-CM | POA: Diagnosis present

## 2019-07-07 DIAGNOSIS — I2699 Other pulmonary embolism without acute cor pulmonale: Secondary | ICD-10-CM

## 2019-07-07 DIAGNOSIS — E785 Hyperlipidemia, unspecified: Secondary | ICD-10-CM | POA: Diagnosis present

## 2019-07-07 DIAGNOSIS — I2694 Multiple subsegmental pulmonary emboli without acute cor pulmonale: Secondary | ICD-10-CM

## 2019-07-07 DIAGNOSIS — Z7902 Long term (current) use of antithrombotics/antiplatelets: Secondary | ICD-10-CM | POA: Diagnosis not present

## 2019-07-07 DIAGNOSIS — R8271 Bacteriuria: Secondary | ICD-10-CM | POA: Diagnosis present

## 2019-07-07 DIAGNOSIS — M7989 Other specified soft tissue disorders: Secondary | ICD-10-CM | POA: Diagnosis not present

## 2019-07-07 DIAGNOSIS — I10 Essential (primary) hypertension: Secondary | ICD-10-CM | POA: Diagnosis present

## 2019-07-07 DIAGNOSIS — Z79891 Long term (current) use of opiate analgesic: Secondary | ICD-10-CM | POA: Diagnosis not present

## 2019-07-07 DIAGNOSIS — N179 Acute kidney failure, unspecified: Secondary | ICD-10-CM | POA: Diagnosis present

## 2019-07-07 HISTORY — DX: Other pulmonary embolism without acute cor pulmonale: I26.99

## 2019-07-07 LAB — BRAIN NATRIURETIC PEPTIDE: B Natriuretic Peptide: 25.7 pg/mL (ref 0.0–100.0)

## 2019-07-07 LAB — URINALYSIS, ROUTINE W REFLEX MICROSCOPIC
Bilirubin Urine: NEGATIVE
Glucose, UA: NEGATIVE mg/dL
Ketones, ur: NEGATIVE mg/dL
Nitrite: NEGATIVE
Protein, ur: NEGATIVE mg/dL
Specific Gravity, Urine: 1.01 (ref 1.005–1.030)
pH: 6 (ref 5.0–8.0)

## 2019-07-07 LAB — CBC
HCT: 39.8 % (ref 36.0–46.0)
Hemoglobin: 12.7 g/dL (ref 12.0–15.0)
MCH: 27.8 pg (ref 26.0–34.0)
MCHC: 31.9 g/dL (ref 30.0–36.0)
MCV: 87.1 fL (ref 80.0–100.0)
Platelets: 328 10*3/uL (ref 150–400)
RBC: 4.57 MIL/uL (ref 3.87–5.11)
RDW: 14.4 % (ref 11.5–15.5)
WBC: 8.9 10*3/uL (ref 4.0–10.5)
nRBC: 0 % (ref 0.0–0.2)

## 2019-07-07 LAB — TROPONIN I (HIGH SENSITIVITY)
Troponin I (High Sensitivity): 10 ng/L (ref <18)
Troponin I (High Sensitivity): 10 ng/L (ref <18)

## 2019-07-07 LAB — URINALYSIS, MICROSCOPIC (REFLEX)

## 2019-07-07 LAB — D-DIMER, QUANTITATIVE: D-Dimer, Quant: 2.12 ug/mL-FEU — ABNORMAL HIGH (ref 0.00–0.50)

## 2019-07-07 LAB — BASIC METABOLIC PANEL
Anion gap: 10 (ref 5–15)
BUN: 12 mg/dL (ref 8–23)
CO2: 23 mmol/L (ref 22–32)
Calcium: 8.7 mg/dL — ABNORMAL LOW (ref 8.9–10.3)
Chloride: 104 mmol/L (ref 98–111)
Creatinine, Ser: 1.03 mg/dL — ABNORMAL HIGH (ref 0.44–1.00)
GFR calc Af Amer: >60 mL/min (ref >60)
GFR calc non Af Amer: 54 mL/min — ABNORMAL LOW (ref >60)
Glucose, Bld: 128 mg/dL — ABNORMAL HIGH (ref 70–99)
Potassium: 3.6 mmol/L (ref 3.5–5.1)
Sodium: 137 mmol/L (ref 135–145)

## 2019-07-07 MED ORDER — ASPIRIN EC 81 MG PO TBEC: 324.0000 mg | DELAYED_RELEASE_TABLET | Freq: Every day | ORAL | Status: DC | PRN

## 2019-07-07 MED ORDER — SODIUM CHLORIDE 0.9 % IV BOLUS
500.0000 mL | Freq: Once | INTRAVENOUS | Status: AC
  Administered 2019-07-07: 500 mL via INTRAVENOUS

## 2019-07-07 MED ORDER — IOHEXOL 350 MG/ML SOLN
100.0000 mL | Freq: Once | INTRAVENOUS | Status: AC | PRN
  Administered 2019-07-07: 18:00:00 89 mL via INTRAVENOUS

## 2019-07-07 MED ORDER — ROSUVASTATIN CALCIUM 20 MG PO TABS
40.0000 mg | ORAL_TABLET | Freq: Every day | ORAL | Status: DC
  Administered 2019-07-08 – 2019-07-09 (×2): 40 mg via ORAL
  Filled 2019-07-07 (×2): qty 2

## 2019-07-07 MED ORDER — MORPHINE SULFATE (PF) 2 MG/ML IV SOLN
0.5000 mg | INTRAVENOUS | Status: DC | PRN
  Administered 2019-07-07 – 2019-07-09 (×5): 0.5 mg via INTRAVENOUS
  Filled 2019-07-07 (×5): qty 1

## 2019-07-07 MED ORDER — PANTOPRAZOLE SODIUM 40 MG PO TBEC
40.0000 mg | DELAYED_RELEASE_TABLET | Freq: Every day | ORAL | Status: DC
  Administered 2019-07-08 – 2019-07-09 (×2): 40 mg via ORAL
  Filled 2019-07-07 (×2): qty 1

## 2019-07-07 MED ORDER — SODIUM CHLORIDE 0.9 % IV SOLN
INTRAVENOUS | Status: DC | PRN
  Administered 2019-07-07: 19:00:00 500 mL via INTRAVENOUS

## 2019-07-07 MED ORDER — METOPROLOL TARTRATE 25 MG PO TABS
25.0000 mg | ORAL_TABLET | Freq: Two times a day (BID) | ORAL | Status: DC
  Administered 2019-07-08 – 2019-07-09 (×4): 25 mg via ORAL
  Filled 2019-07-07 (×4): qty 1

## 2019-07-07 MED ORDER — HEPARIN BOLUS VIA INFUSION
4400.0000 [IU] | Freq: Once | INTRAVENOUS | Status: AC
  Administered 2019-07-07: 4400 [IU] via INTRAVENOUS

## 2019-07-07 MED ORDER — ACETAMINOPHEN 325 MG PO TABS
650.0000 mg | ORAL_TABLET | Freq: Four times a day (QID) | ORAL | Status: DC | PRN
  Administered 2019-07-08: 650 mg via ORAL
  Filled 2019-07-07: qty 2

## 2019-07-07 MED ORDER — HEPARIN (PORCINE) 25000 UT/250ML-% IV SOLN
1100.0000 [IU]/h | INTRAVENOUS | Status: DC
  Administered 2019-07-07: 1100 [IU]/h via INTRAVENOUS
  Filled 2019-07-07: qty 250

## 2019-07-07 MED ORDER — DIAZEPAM 5 MG PO TABS
2.5000 mg | ORAL_TABLET | Freq: Every day | ORAL | Status: DC
  Administered 2019-07-08 – 2019-07-09 (×2): 2.5 mg via ORAL
  Filled 2019-07-07 (×2): qty 1

## 2019-07-07 MED ORDER — AMLODIPINE BESYLATE 5 MG PO TABS
2.5000 mg | ORAL_TABLET | Freq: Every day | ORAL | Status: DC
  Administered 2019-07-08 – 2019-07-09 (×2): 2.5 mg via ORAL
  Filled 2019-07-07 (×2): qty 1

## 2019-07-07 MED ORDER — CLOPIDOGREL BISULFATE 75 MG PO TABS
75.0000 mg | ORAL_TABLET | Freq: Every day | ORAL | Status: DC
  Administered 2019-07-08 (×2): 75 mg via ORAL
  Filled 2019-07-07 (×2): qty 1

## 2019-07-07 MED ORDER — SODIUM CHLORIDE 0.9% FLUSH
3.0000 mL | Freq: Once | INTRAVENOUS | Status: DC
  Filled 2019-07-07: qty 3

## 2019-07-07 NOTE — ED Notes (Signed)
[facility] notified (Hannah [name]) - patient ready for transport 

## 2019-07-07 NOTE — Progress Notes (Signed)
ANTICOAGULATION CONSULT NOTE  Pharmacy Consult for Heparin Indication: pulmonary embolus  Allergies  Allergen Reactions  . Amoxicillin Swelling  . Ramipril Swelling    Angioedema     Patient Measurements: Height: 5' 2.5" (158.8 cm) Weight: 139 lb (63 kg) IBW/kg (Calculated) : 51.25 Heparin Dosing Weight: 63 kg  Vital Signs: Temp: 98.7 F (37.1 C) (03/27 2003) Temp Source: Oral (03/27 2003) BP: 139/67 (03/27 2003) Pulse Rate: 84 (03/27 2003)  Labs: Recent Labs    07/07/19 1616 07/07/19 1617  HGB 12.7  --   HCT 39.8  --   PLT 328  --   CREATININE 1.03*  --   TROPONINIHS  --  10    Estimated Creatinine Clearance: 43 mL/min (A) (by C-G formula based on SCr of 1.03 mg/dL (H)).   Medical History: Past Medical History:  Diagnosis Date  . Family history of heart disease   . Hyperlipidemia   . Hypertension   . Leriche syndrome (HCC)     Medications:  Scheduled:  . sodium chloride flush  3 mL Intravenous Once    Assessment: Patient is a 66 yof that is being admitted for a PE. Pharmacy has been asked to dose heparin at this time. Patient does not appear to be on any blood thinners PTA.  Goal of Therapy:  Heparin level 0.3-0.7 units/ml Monitor platelets by anticoagulation protocol: Yes   Plan:  - Heparin bolus 4400 units IV x 1 dose - Heparin drip @ 1100 units/hr - Heparin level in ~ 8 hours  - Monitor patient for s/s of bleeding and CBC while on heparin   Joaquim Lai PharmD. BCPS  07/07/2019,8:32 PM

## 2019-07-07 NOTE — ED Triage Notes (Signed)
Pt states that she has had chest pain x 3 days and 1 week of SOB - pt has noted dyspnea with walking to room. Pt states that he chest pain is worse today

## 2019-07-07 NOTE — ED Provider Notes (Signed)
[facility] EMERGENCY DEPARTMENT Provider Note   CSN: [id number] Arrival date & time: 07/07/19  1557     History Chief Complaint  Patient presents with  . Chest Pain    Hannah [name] is a 74 y.o. female.  Patient is a 74 year old female with a history of hypertension, hyperlipidemia and Leriche syndrome who presents with chest pain and shortness of breath.  She states she has been feeling bad for about a week with increased fatigue and shortness of breath.  She gets short of breath on exertion.  She started having some chest pain about 3 days ago.  It is been constant.  On the left side.  It radiates up to her left neck and sometimes to her back but she denies any current back pain.  Still a bit worse with movement.  She has not taking thing at home for the pain.  She denies any nausea or vomiting.  She has a little bit of odor to her urine.  She has a little bit of mucus in her throat but that has been going on for a long time.  She has occasional swelling to her legs but denies any increased swelling.  No known fevers at home.  The chest pain is not worse with exertion.  It is worse when she takes a deep breath.        Past Medical History:  Diagnosis Date  . Family history of heart disease   . Hyperlipidemia   . Hypertension   . Leriche syndrome [facility])     Patient Active Problem List   Diagnosis Date Noted  . Pulmonary emboli (HCC) 07/07/2019  . Acute pulmonary embolism (HCC) 07/07/2019  . Asymptomatic bacteriuria 07/07/2019  . Family history of coronary artery disease 01/17/2019  . Normocytic anemia 11/28/2016  . Palpitations 06/08/2016  . Chest pain with normal coronary angiography 08/09/2014  . Peripheral arterial disease (HCC) 06/25/2013  . Essential hypertension 06/25/2013  . Dyslipidemia 06/25/2013    Past Surgical History:  Procedure Laterality Date  . ABI - BILATERAL  2008   moderate arterial insufficiency at rest; pulsatile flow of bilateral  ankle PVRs  . APPENDECTOMY  11/21/2002   acute appendicitis  . LEFT HEART CATHETERIZATION WITH CORONARY ANGIOGRAM N/A 11/10/2011   Procedure: LEFT HEART CATHETERIZATION WITH CORONARY ANGIOGRAM;  Surgeon: Runell Gess, MD;  Location: [facility] CATH LAB;  Service: Cardiovascular;  Laterality: N/A; - normal left main/LAD/L Cfx/RCA  . NM MYOCAR PERF WALL MOTION  10/2011   lexiscan - EF71%, normal perfusion; low risk scan  . ROTATOR CUFF REPAIR  1995  . TRANSTHORACIC ECHOCARDIOGRAM  10/2011   EF=>55%, calcified moderator band in the RV; borderline LA enlargement; mild MR; mild TR; trace AV regurg & pulm valve regurg     OB History   No obstetric history on file.     Family History  Problem Relation Age of Onset  . Heart disease Father   . Hypertension Father   . Cancer Father   . Heart attack Father        in 30s  . Hypertension Brother        2 with CABG  . Stroke Sister   . Hypertension Sister   . Heart Problems Child        born with enlarged heart    Social History   Tobacco Use  . Smoking status: Former Games developer  . Smokeless tobacco: Never Used  Substance Use Topics  . Alcohol use: Yes  Comment: occasional  . Drug use: No    Home Medications Prior to Admission medications   Medication Sig Start Date End Date Taking? Authorizing Provider  amLODipine (NORVASC) 2.5 MG tablet Take 1 tablet (2.5 mg total) by mouth daily. 08/11/18  Yes Runell Gess, MD  aspirin EC 81 MG tablet Take 324 mg by mouth daily as needed for mild pain.    Yes [provider]  clopidogrel (PLAVIX) 75 MG tablet Take 75 mg by mouth at bedtime.    Yes [provider]  Coenzyme Q-10 200 MG CAPS Take 200 mg by mouth daily.    Yes [provider]  DEXILANT 60 MG capsule Take 60 mg by mouth daily. 08/25/15  Yes [provider]  diazepam (VALIUM) 5 MG tablet Take 2.5 mg by mouth daily.  06/01/13  Yes [provider]  furosemide (LASIX) 20 MG tablet Take 20 mg by  mouth daily.  01/04/18  Yes [provider]  metoprolol tartrate (LOPRESSOR) 25 MG tablet TAKE 1 TABLET(25 MG) BY MOUTH TWICE DAILY Patient taking differently: Take 25 mg by mouth 2 (two) times daily.  12/20/18  Yes Runell Gess, MD  rosuvastatin (CRESTOR) 40 MG tablet Take 40 mg by mouth daily.   Yes [provider]  Vitamin D, Ergocalciferol, (DRISDOL) 50000 units CAPS capsule Take 50,000 Units by mouth every Wednesday.  01/11/18  Yes [provider]    Allergies    Amoxicillin and Ramipril  Review of Systems   Review of Systems  Constitutional: Positive for fatigue. Negative for chills, diaphoresis and fever.  HENT: Positive for congestion. Negative for rhinorrhea and sneezing.   Eyes: Negative.   Respiratory: Positive for shortness of breath. Negative for cough and chest tightness.   Cardiovascular: Positive for chest pain. Negative for leg swelling.  Gastrointestinal: Negative for abdominal pain, blood in stool, diarrhea, nausea and vomiting.  Genitourinary: Negative for difficulty urinating, flank pain, frequency and hematuria.  Musculoskeletal: Negative for arthralgias and back pain.  Skin: Negative for rash.  Neurological: Negative for dizziness, speech difficulty, weakness, numbness and headaches.    Physical Exam Updated Vital Signs BP (!) 152/75   Pulse 79   Temp 98.5 F (36.9 C) (Oral)   Resp 17   Ht 5' 2.5" (1.588 m)   Wt 63 kg   SpO2 98%   BMI 25.02 kg/m   Physical Exam Constitutional:      Appearance: She is well-developed.  HENT:     Head: Normocephalic and atraumatic.  Eyes:     Pupils: Pupils are equal, round, and reactive to light.  Cardiovascular:     Rate and Rhythm: Normal rate and regular rhythm.     Heart sounds: Normal heart sounds.  Pulmonary:     Effort: Pulmonary effort is normal. No respiratory distress.     Breath sounds: Normal breath sounds. No wheezing or rales.  Chest:     Chest wall: Tenderness (Positive  tenderness to the left chest wall and over the left trapezius muscle, no crepitus or deformity) present.  Abdominal:     General: Bowel sounds are normal.     Palpations: Abdomen is soft.     Tenderness: There is no abdominal tenderness. There is no guarding or rebound.  Musculoskeletal:        General: Normal range of motion.     Cervical back: Normal range of motion and neck supple.     Comments: No edema or calf tenderness  Lymphadenopathy:  Cervical: No cervical adenopathy.  Skin:    General: Skin is warm and dry.     Findings: No rash.  Neurological:     Mental Status: She is alert and oriented to person, place, and time.     ED Results / Procedures / Treatments   Labs (all labs ordered are listed, but only abnormal results are displayed) Labs Reviewed  BASIC METABOLIC PANEL - Abnormal; Notable for the following components:      Result Value   Glucose, Bld 128 (*)    Creatinine, Ser 1.03 (*)    Calcium 8.7 (*)    GFR calc non Af Amer 54 (*)    All other components within normal limits  D-DIMER, QUANTITATIVE (NOT AT [facility]) - Abnormal; Notable for the following components:   D-Dimer, Quant 2.12 (*)    All other components within normal limits  URINALYSIS, ROUTINE W REFLEX MICROSCOPIC - Abnormal; Notable for the following components:   Hgb urine dipstick TRACE (*)    Leukocytes,Ua MODERATE (*)    All other components within normal limits  URINALYSIS, MICROSCOPIC (REFLEX) - Abnormal; Notable for the following components:   Bacteria, UA MANY (*)    Non Squamous Epithelial PRESENT (*)    All other components within normal limits  SARS CORONAVIRUS 2 (TAT 6-24 HRS)  CBC  BRAIN NATRIURETIC PEPTIDE  HEPARIN LEVEL (UNFRACTIONATED)  BASIC METABOLIC PANEL  CBC  TROPONIN I (HIGH SENSITIVITY)  TROPONIN I (HIGH SENSITIVITY)  TROPONIN I (HIGH SENSITIVITY)    EKG EKG Interpretation  Date/Time:  Saturday July 07 2019 16:10:24 EDT Ventricular Rate:  100 PR Interval:      QRS Duration: 90 QT Interval:  351 QTC Calculation: 453 R Axis:   40 Text Interpretation: Sinus tachycardia Ventricular premature complex Aberrant conduction of SV complex(es) Borderline T abnormalities, anterior leads since last tracing no significant change Confirmed by Malvin Johns [phone]) on 07/07/2019 4:16:11 PM   Radiology DG Chest 2 View  Result Date: 07/07/2019 CLINICAL DATA:  Chest pain EXAM: CHEST - 2 VIEW COMPARISON:  Chest radiograph dated 11/27/2016. FINDINGS: The heart is mildly enlarged. Trace bilateral pleural effusions with associated atelectasis/airspace disease are noted. There is no pleural effusion. The osseous structures are intact. There is dextrocurvature of the lumbar spine. IMPRESSION: Trace bilateral pleural effusions with associated atelectasis/airspace disease. Electronically Signed   By: Zerita Boers M.D.   On: 07/07/2019 17:07   CT Angio Chest PE W/Cm &/Or Wo Cm  Result Date: 07/07/2019 CLINICAL DATA:  Shortness of breath. Elevated D-dimer. EXAM: CT ANGIOGRAPHY CHEST WITH CONTRAST TECHNIQUE: Multidetector CT imaging of the chest was performed using the standard protocol during bolus administration of intravenous contrast. Multiplanar CT image reconstructions and MIPs were obtained to evaluate the vascular anatomy. CONTRAST:  30mL OMNIPAQUE IOHEXOL 350 MG/ML SOLN COMPARISON:  CT abdomen pelvis 08/26/2005 FINDINGS: Cardiovascular: Normal heart size. Trace fluid superior pericardial recess. Aorta and main pulmonary artery are normal in caliber. Motion artifact limits evaluation. Filling defect is demonstrated within the distal right main pulmonary artery branching into the proximal right middle and right lower lobar pulmonary arteries. Filling defect demonstrated within the left lower lobe segmental pulmonary arteries. Filling defect demonstrated within the lingular pulmonary arteries. No CT evidence to suggest right heart strain. Mediastinum/Nodes: No enlarged axillary,  mediastinal or hilar lymphadenopathy. Lungs/Pleura: Central airways are patent. Scattered bandlike areas of consolidation and ground-glass opacities predominately through the lower lobes, right middle lobe and lingula. Small bilateral pleural effusions. No pneumothorax. Upper Abdomen:  Unremarkable Musculoskeletal: Thoracic spine degenerative changes. No aggressive or acute appearing osseous lesions. Review of the MIP images confirms the above findings. IMPRESSION: 1. Findings compatible with acute pulmonary emboli involving the distal right main pulmonary artery branching into the proximal right middle and right lower lobar pulmonary arteries. Filling defects demonstrated within the left lower lobe segmental pulmonary arteries as well as within the lingular pulmonary arteries. No definite CT evidence to suggest acute right heart strain. 2. Small bilateral pleural effusions. 3. Scattered bandlike areas of consolidation and ground-glass opacities predominately through the lower lobes, right middle lobe and lingula which may represent atelectasis or infection. 4. Critical Value/emergent results were called by telephone at the time of interpretation on 07/07/2019 at 6:22 pm to provider Amberia Bayless , who verbally acknowledged these results. Electronically Signed   By: Annia Beltrew  Davis M.D.   On: 07/07/2019 18:31    Procedures Procedures (including critical care time)  Medications Ordered in ED Medications  sodium chloride flush (NS) 0.9 % injection 3 mL (3 mLs Intravenous Not Given 07/07/19 1614)  heparin ADULT infusion 100 units/mL (25000 units/26350mL sodium chloride 0.45%) (1,100 Units/hr Intravenous Handoff 07/07/19 2133)  0.9 %  sodium chloride infusion (500 mLs Intravenous New Bag/Given 07/07/19 1923)  acetaminophen (TYLENOL) tablet 650 mg (has no administration in time range)  morphine 2 MG/ML injection 0.5 mg (0.5 mg Intravenous Given 07/07/19 2202)  clopidogrel (PLAVIX) tablet 75 mg (has no administration in  time range)  pantoprazole (PROTONIX) EC tablet 40 mg (has no administration in time range)  diazepam (VALIUM) tablet 2.5 mg (has no administration in time range)  rosuvastatin (CRESTOR) tablet 40 mg (has no administration in time range)  metoprolol tartrate (LOPRESSOR) tablet 25 mg (has no administration in time range)  amLODipine (NORVASC) tablet 2.5 mg (has no administration in time range)  iohexol (OMNIPAQUE) 350 MG/ML injection 100 mL (89 mLs Intravenous Contrast Given 07/07/19 1753)  heparin bolus via infusion 4,400 Units (4,400 Units Intravenous Bolus from Bag 07/07/19 1924)  sodium chloride 0.9 % bolus 500 mL (500 mLs Intravenous New Bag/Given 07/07/19 2217)    ED Course  I have reviewed the triage vital signs and the nursing notes.  Pertinent labs & imaging results that were available during my care of the patient were reviewed by me and considered in my medical decision making (see chart for details).    MDM Rules/Calculators/A&P                      Patient is a 74 year old female who presents with chest pain shortness of breath.  Her D-dimer was elevated and a CT scan was performed which showed large right pulmonary artery emboli and smaller left side pulmonary emboli.  She was started on heparin.  She is mildly tachypneic but she has no hypoxia.  I spoke with Dr. Adela Glimpseoutova who admit the patient for further treatment.  Her EKG does not show any ischemic changes.  She does not have suggestions of right heart failure.  CRITICAL CARE Performed by: Rolan BuccoMelanie Alizabeth Antonio Total critical care time: 60 minutes Critical care time was exclusive of separately billable procedures and treating other patients. Critical care was necessary to treat or prevent imminent or life-threatening deterioration. Critical care was time spent personally by me on the following activities: development of treatment plan with patient and/or surrogate as well as nursing, discussions with consultants, evaluation of patient's  response to treatment, examination of patient, obtaining history from patient or surrogate, ordering and performing  treatments and interventions, ordering and review of laboratory studies, ordering and review of radiographic studies, pulse oximetry and re-evaluation of patient's condition.  Final Clinical Impression(s) / ED Diagnoses Final diagnoses:  Other acute pulmonary embolism without acute cor pulmonale [facility])    Rx / DC Orders ED Discharge Orders    None       Rolan Bucco, MD 07/07/19 2352

## 2019-07-07 NOTE — ED Notes (Signed)
ED Provider at bedside. 

## 2019-07-07 NOTE — H&P (Signed)
History and Physical    Hannah [name] TDS:[id number] DOB: 1945-08-09 DOA: 07/07/2019  PCP: Ralene Ok, MD (Confirm with patient/family/NH records and if not entered, this has to be entered at [facility] point of entry) Patient coming from: [facility] ED Lives at home with Husband  I have personally briefly reviewed patient's old medical records in [facility]  Chief Complaint: Chest pain and shortness of breath  HPI: Hannah [name] is a 74 y.o. 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  Patient reports that on 3/17 she received her second dose of Covid vaccine and about 3 to 4 days following that she began to noticed bilateral lower extremity edema and also some burning pain to her left anterior thigh.Thought she felt some nodules there as well but it has since resolved.  She also felt burning sensation of her abdomen but denies any nausea vomiting or diarrhea.  Then about 3 days ago she started to feel constant left sided crushing chest pain and this morning decided to take 4 aspirins. Shortly after the pain radiated to intensify and radiated to her left breast, back and left neck. She also reports chronic shortness of breath that has worsened this week. Also notes dysuria earlier in the week but that has subsided.  Denies any cough but has a lot of sputum. Unsure if she had a fever since her thermometer at home was not working.  Patient used to exercise daily but since she was diagnosed with plantar fasciitis last June she has been more sedentary.  The only exercise she gets is climbing stairs at her house. She denies any personal history of blood clots. She denies tobacco or illicit drug use.  She has occasional wine use.  Family history includes 2 brothers who have had a bypass and a sister who has had a stroke.  At [facility] emergency department, she was afebrile and  normotensive on room air.  She was found to have elevated D-dimer 2.12 and a CTA chest was obtained that showed acute pulmonary emboli involving the distal right main artery and within the left lower lobe segmental pulmonary arteries.  No CT evidence of acute right heart strain.  Review of Systems: Constitutional: No Weight Change, No Fever ENT/Mouth: No sore throat, No Rhinorrhea Eyes: No Eye Pain, No Vision Changes Cardiovascular: + Chest Pain, + SOB, No PND, + Dyspnea on Exertion, No Orthopnea, No Claudication, + Edema, No Palpitations Respiratory: No Cough, +Sputum, No Wheezing Gastrointestinal: No Nausea, No Vomiting, No Diarrhea, No Constipation, + Pain Genitourinary: no Urinary Incontinence Musculoskeletal: No Arthralgias, No Myalgias Skin: No Skin Lesions, No Pruritus, Neuro: no Weakness, No Numbness Psych: No Anxiety/Panic, No Depression, no decrease appetite Heme/Lymph: No Bruising, No Bleeding  Past Medical History:  Diagnosis Date  . Family history of heart disease   . Hyperlipidemia   . Hypertension   . Leriche syndrome [facility])     Past Surgical History:  Procedure Laterality Date  . ABI - BILATERAL  2008   moderate arterial insufficiency at rest; pulsatile flow of bilateral ankle PVRs  . APPENDECTOMY  11/21/2002   acute appendicitis  . LEFT HEART CATHETERIZATION WITH CORONARY ANGIOGRAM N/A 11/10/2011   Procedure: LEFT HEART CATHETERIZATION WITH CORONARY ANGIOGRAM;  Surgeon: Runell Gess, MD;  Location: [facility] CATH LAB;  Service: Cardiovascular;  Laterality: N/A; - normal left main/LAD/L Cfx/RCA  . NM MYOCAR PERF WALL MOTION  10/2011  lexiscan - EF71%, normal perfusion; low risk scan  . ROTATOR CUFF REPAIR  1995  . TRANSTHORACIC ECHOCARDIOGRAM  10/2011   EF=>55%, calcified moderator band in the RV; borderline LA enlargement; mild MR; mild TR; trace AV regurg & pulm valve regurg     reports that she has quit smoking. She has never used smokeless tobacco. She reports  current alcohol use. She reports that she does not use drugs.  Allergies  Allergen Reactions  . Amoxicillin Swelling  . Ramipril Swelling    Angioedema     Family History  Problem Relation Age of Onset  . Heart disease Father   . Hypertension Father   . Cancer Father   . Heart attack Father        in 30s  . Hypertension Brother        2 with CABG  . Stroke Sister   . Hypertension Sister   . Heart Problems Child        born with enlarged heart     Prior to Admission medications   Medication Sig Start Date End Date Taking? Authorizing Provider  amLODipine (NORVASC) 2.5 MG tablet Take 1 tablet (2.5 mg total) by mouth daily. 08/11/18   Runell Gess, MD  aspirin EC 81 MG tablet Take 81 mg by mouth daily as needed for mild pain.     [provider]  clopidogrel (PLAVIX) 75 MG tablet Take 75 mg by mouth at bedtime.     [provider]  Coenzyme Q-10 200 MG CAPS Take 1 capsule by mouth daily.    [provider]  DEXILANT 60 MG capsule Take 60 mg by mouth daily. 08/25/15   [provider]  diazepam (VALIUM) 5 MG tablet Take 2.5 mg by mouth daily.  06/01/13   [provider]  ezetimibe (ZETIA) 10 MG tablet Take 1 tablet by mouth daily. 05/29/18   [provider]  furosemide (LASIX) 20 MG tablet Take 1 tablet by mouth daily. 01/04/18   [provider]  metoprolol tartrate (LOPRESSOR) 25 MG tablet TAKE 1 TABLET(25 MG) BY MOUTH TWICE DAILY Patient taking differently: Take 25 mg by mouth 3 (three) times daily. TAKE 1 TABLET(25 MG) BY THREE TIMES DAILY 12/20/18   Runell Gess, MD  rosuvastatin (CRESTOR) 40 MG tablet Take 40 mg by mouth daily.    [provider]  Vitamin D, Ergocalciferol, (DRISDOL) 50000 units CAPS capsule Take 50,000 Units by mouth once a week. 01/11/18   [provider]    Physical Exam: Vitals:   07/07/19 1840 07/07/19 1900 07/07/19 1907 07/07/19 2003  BP: (!) 143/72 (!) 134/59  139/67    Pulse: 86  87 84  Resp: (!) 30  13 (!) 29  Temp:    98.7 F (37.1 C)  TempSrc:    Oral  SpO2: 97%  98% 97%  Weight:      Height:        Constitutional: NAD, calm, comfortable, elderly female lying flat in bed Vitals:   07/07/19 1840 07/07/19 1900 07/07/19 1907 07/07/19 2003  BP: (!) 143/72 (!) 134/59  139/67  Pulse: 86  87 84  Resp: (!) 30  13 (!) 29  Temp:    98.7 F (37.1 C)  TempSrc:    Oral  SpO2: 97%  98% 97%  Weight:      Height:       Eyes: PERRL, lids and conjunctivae normal ENMT: Mucous membranes are moist.  Neck: normal, supple  Respiratory: Faint bibasilar crackles, no wheezing.  Mildly tachypneic at rest. no accessory muscle use.  Cardiovascular: Regular rate and rhythm, no murmurs / rubs / gallops. No extremity edema. 2+ pedal pulses.  Pain with palpation of the left chest wall.  No discoloration, discharge or abnormalities noted to left breast. Abdomen: no tenderness, no masses palpated.Bowel sounds positive.  Musculoskeletal: no clubbing / cyanosis. No joint deformity upper and lower extremities. Good ROM, no contractures. Normal muscle tone.  Skin: no rashes, lesions, ulcers. No induration Neurologic: CN 2-12 grossly intact. Sensation intact. Strength 5/5 in all 4.  Psychiatric: Normal judgment and insight. Alert and oriented x 3. Normal mood.    Labs on Admission: I have personally reviewed following labs and imaging studies  CBC: Recent Labs  Lab 07/07/19 1616  WBC 8.9  HGB 12.7  HCT 39.8  MCV 87.1  PLT 328   Basic Metabolic Panel: Recent Labs  Lab 07/07/19 1616  NA 137  K 3.6  CL 104  CO2 23  GLUCOSE 128*  BUN 12  CREATININE 1.03*  CALCIUM 8.7*   GFR: Estimated Creatinine Clearance: 43 mL/min (A) (by C-G formula based on SCr of 1.03 mg/dL (H)). Liver Function Tests: No results for input(s): AST, ALT, ALKPHOS, BILITOT, PROT, ALBUMIN in the last 168 hours. No results for input(s): LIPASE, AMYLASE in the last 168 hours. No results for  input(s): AMMONIA in the last 168 hours. Coagulation Profile: No results for input(s): INR, PROTIME in the last 168 hours. Cardiac Enzymes: No results for input(s): CKTOTAL, CKMB, CKMBINDEX, TROPONINI in the last 168 hours. BNP (last 3 results) No results for input(s): PROBNP in the last 8760 hours. HbA1C: No results for input(s): HGBA1C in the last 72 hours. CBG: No results for input(s): GLUCAP in the last 168 hours. Lipid Profile: No results for input(s): CHOL, HDL, LDLCALC, TRIG, CHOLHDL, LDLDIRECT in the last 72 hours. Thyroid Function Tests: No results for input(s): TSH, T4TOTAL, FREET4, T3FREE, THYROIDAB in the last 72 hours. Anemia Panel: No results for input(s): VITAMINB12, FOLATE, FERRITIN, TIBC, IRON, RETICCTPCT in the last 72 hours. Urine analysis:    Component Value Date/Time   COLORURINE YELLOW 07/07/2019 1722   APPEARANCEUR CLEAR 07/07/2019 1722   LABSPEC 1.010 07/07/2019 1722   PHURINE 6.0 07/07/2019 1722   GLUCOSEU NEGATIVE 07/07/2019 1722   HGBUR TRACE (A) 07/07/2019 1722   BILIRUBINUR NEGATIVE 07/07/2019 1722   KETONESUR NEGATIVE 07/07/2019 1722   PROTEINUR NEGATIVE 07/07/2019 1722   NITRITE NEGATIVE 07/07/2019 1722   LEUKOCYTESUR MODERATE (A) 07/07/2019 1722    Radiological Exams on Admission: DG Chest 2 View  Result Date: 07/07/2019 CLINICAL DATA:  Chest pain EXAM: CHEST - 2 VIEW COMPARISON:  Chest radiograph dated 11/27/2016. FINDINGS: The heart is mildly enlarged. Trace bilateral pleural effusions with associated atelectasis/airspace disease are noted. There is no pleural effusion. The osseous structures are intact. There is dextrocurvature of the lumbar spine. IMPRESSION: Trace bilateral pleural effusions with associated atelectasis/airspace disease. Electronically Signed   By: Romona Curls M.D.   On: 07/07/2019 17:07   CT Angio Chest PE W/Cm &/Or Wo Cm  Result Date: 07/07/2019 CLINICAL DATA:  Shortness of breath. Elevated D-dimer. EXAM: CT ANGIOGRAPHY  CHEST WITH CONTRAST TECHNIQUE: Multidetector CT imaging of the chest was performed using the standard protocol during bolus administration of intravenous contrast. Multiplanar CT image reconstructions and MIPs were obtained to evaluate the vascular anatomy. CONTRAST:  8mL OMNIPAQUE IOHEXOL 350 MG/ML SOLN COMPARISON:  CT abdomen pelvis 08/26/2005 FINDINGS: Cardiovascular:  Normal heart size. Trace fluid superior pericardial recess. Aorta and main pulmonary artery are normal in caliber. Motion artifact limits evaluation. Filling defect is demonstrated within the distal right main pulmonary artery branching into the proximal right middle and right lower lobar pulmonary arteries. Filling defect demonstrated within the left lower lobe segmental pulmonary arteries. Filling defect demonstrated within the lingular pulmonary arteries. No CT evidence to suggest right heart strain. Mediastinum/Nodes: No enlarged axillary, mediastinal or hilar lymphadenopathy. Lungs/Pleura: Central airways are patent. Scattered bandlike areas of consolidation and ground-glass opacities predominately through the lower lobes, right middle lobe and lingula. Small bilateral pleural effusions. No pneumothorax. Upper Abdomen: Unremarkable Musculoskeletal: Thoracic spine degenerative changes. No aggressive or acute appearing osseous lesions. Review of the MIP images confirms the above findings. IMPRESSION: 1. Findings compatible with acute pulmonary emboli involving the distal right main pulmonary artery branching into the proximal right middle and right lower lobar pulmonary arteries. Filling defects demonstrated within the left lower lobe segmental pulmonary arteries as well as within the lingular pulmonary arteries. No definite CT evidence to suggest acute right heart strain. 2. Small bilateral pleural effusions. 3. Scattered bandlike areas of consolidation and ground-glass opacities predominately through the lower lobes, right middle lobe and  lingula which may represent atelectasis or infection. 4. Critical Value/emergent results were called by telephone at the time of interpretation on 07/07/2019 at 6:22 pm to provider MELANIE BELFI , who verbally acknowledged these results. Electronically Signed   By: Lovey Newcomer M.D.   On: 07/07/2019 18:31    EKG: Independently reviewed.   Assessment/Plan  Acute bilateral pulmonary emboli No CT evidence of acute right heart strain Initial troponin of 10.  Will continue to trend.  Vitals are stable. Continue IV heparin infusion Continue pulse oximetry  AKI Give 500 IV fluid bolus Monitor after fluids Avoid nephrotoxic agent  Asymptomatic bacteriuria Will not start antibiotics at this time  HTN Continue amlodipine, metoprolol Hold Lasix  HLD Continue statin  PAD Continue Plavix  DVT prophylaxis:Heparin infusion Code Status: Full Family Communication: Plan discussed with patient at bedside  disposition Plan: Home with at least 2 midnight stays  Consults called:  Admission status: inpatient     Shyniece Scripter T Kaelea Gathright DO [facility]   If 7PM-7AM, please contact night-coverage [email or web address]   07/07/2019, 9:28 PM

## 2019-07-08 ENCOUNTER — Inpatient Hospital Stay (HOSPITAL_COMMUNITY): Payer: Medicare Other

## 2019-07-08 DIAGNOSIS — M7989 Other specified soft tissue disorders: Secondary | ICD-10-CM

## 2019-07-08 LAB — BASIC METABOLIC PANEL
Anion gap: 10 (ref 5–15)
BUN: 12 mg/dL (ref 8–23)
CO2: 22 mmol/L (ref 22–32)
Calcium: 8.4 mg/dL — ABNORMAL LOW (ref 8.9–10.3)
Chloride: 109 mmol/L (ref 98–111)
Creatinine, Ser: 0.8 mg/dL (ref 0.44–1.00)
GFR calc Af Amer: >60 mL/min (ref >60)
GFR calc non Af Amer: >60 mL/min (ref >60)
Glucose, Bld: 111 mg/dL — ABNORMAL HIGH (ref 70–99)
Potassium: 3.5 mmol/L (ref 3.5–5.1)
Sodium: 141 mmol/L (ref 135–145)

## 2019-07-08 LAB — HEPARIN LEVEL (UNFRACTIONATED)
Heparin Unfractionated: 1.01 IU/mL — ABNORMAL HIGH (ref 0.30–0.70)
Heparin Unfractionated: 0.6 IU/mL (ref 0.30–0.70)
Heparin Unfractionated: 0.36 IU/mL (ref 0.30–0.70)

## 2019-07-08 LAB — CBC
HCT: 37.1 % (ref 36.0–46.0)
Hemoglobin: 11.5 g/dL — ABNORMAL LOW (ref 12.0–15.0)
MCH: 27.6 pg (ref 26.0–34.0)
MCHC: 31 g/dL (ref 30.0–36.0)
MCV: 89 fL (ref 80.0–100.0)
Platelets: 311 10*3/uL (ref 150–400)
RBC: 4.17 MIL/uL (ref 3.87–5.11)
RDW: 14.6 % (ref 11.5–15.5)
WBC: 8.2 10*3/uL (ref 4.0–10.5)
nRBC: 0 % (ref 0.0–0.2)

## 2019-07-08 LAB — TROPONIN I (HIGH SENSITIVITY): Troponin I (High Sensitivity): 11 ng/L (ref <18)

## 2019-07-08 LAB — SARS CORONAVIRUS 2 (TAT 6-24 HRS): SARS Coronavirus 2: NEGATIVE

## 2019-07-08 MED ORDER — HEPARIN (PORCINE) 25000 UT/250ML-% IV SOLN
700.0000 [IU]/h | INTRAVENOUS | Status: DC
  Filled 2019-07-08: qty 250

## 2019-07-08 NOTE — Progress Notes (Signed)
ANTICOAGULATION CONSULT NOTE  Pharmacy Consult for Heparin Indication: pulmonary embolus  Allergies  Allergen Reactions  . Amoxicillin Swelling  . Ramipril Swelling    Angioedema     Patient Measurements: Height: 5' 2.5" (158.8 cm) Weight: 139 lb (63 kg) IBW/kg (Calculated) : 51.25 Heparin Dosing Weight: 63 kg  Vital Signs: Temp: 98.5 F (36.9 C) (03/28 0530) Temp Source: Oral (03/28 0530) BP: 130/55 (03/28 0530) Pulse Rate: 69 (03/28 0530)  Labs: Recent Labs    07/07/19 1616 07/07/19 1617 07/07/19 2128 07/07/19 2312 07/08/19 0418  HGB 12.7  --   --   --  11.5*  HCT 39.8  --   --   --  37.1  PLT 328  --   --   --  311  HEPARINUNFRC  --   --   --   --  1.01*  CREATININE 1.03*  --   --   --   --   TROPONINIHS  --  10 10 11   --     Estimated Creatinine Clearance: 43 mL/min (A) (by C-G formula based on SCr of 1.03 mg/dL (H)).   Medical History: Past Medical History:  Diagnosis Date  . Family history of heart disease   . Hyperlipidemia   . Hypertension   . Leriche syndrome (HCC)     Medications:  Scheduled:  . amLODipine  2.5 mg Oral Daily  . clopidogrel  75 mg Oral QHS  . diazepam  2.5 mg Oral Daily  . metoprolol tartrate  25 mg Oral BID  . pantoprazole  40 mg Oral Daily  . rosuvastatin  40 mg Oral Daily  . sodium chloride flush  3 mL Intravenous Once    Assessment: Patient is a 77 yof that is being admitted for a PE. Pharmacy has been asked to dose heparin at this time. Patient does not appear to be on any blood thinners PTA.  Today, 07/08/19  Heparin level SUPRAtherapeutic on current IV heparin rate of 1100 units/hr  CBC - Hgb 11.5 down from 12.7. Plts stable  Per RN, no reported bleeding or issues of note  Goal of Therapy:  Heparin level 0.3-0.7 units/ml Monitor platelets by anticoagulation protocol: Yes   Plan:   Reduce IV heparin rate from 1100 units/hr to 700 units/hr  Recheck heparin level 8 hours after rate change  Monitor  patient for s/s of bleeding and CBC while on heparin   07/10/19 PharmD. BCPS  07/08/2019,5:34 AM

## 2019-07-08 NOTE — Progress Notes (Signed)
Brief Pharmacy Consult Note - IV Heparin  Labs: heparin level 0.36  A/P: Heparin level therapeutic (goal 0.3-0.7) for PE on current IV heparin rate of 700 units/hr. No reported bleeding. Continue current IV heparin rate. Recheck heparin level in AM  Hessie Knows, PharmD, BCPS 07/08/2019 10:11 PM

## 2019-07-08 NOTE — Progress Notes (Signed)
ANTICOAGULATION CONSULT NOTE  Pharmacy Consult for Heparin Indication: pulmonary embolus  Allergies  Allergen Reactions  . Amoxicillin Swelling  . Ramipril Swelling    Angioedema    Patient Measurements: Height: 5' 2.5" (158.8 cm) Weight: 139 lb (63 kg) IBW/kg (Calculated) : 51.25 Heparin Dosing Weight: 63 kg  Vital Signs: Temp: 97.9 F (36.6 C) (03/28 1332) Temp Source: Oral (03/28 1332) BP: 118/52 (03/28 1332) Pulse Rate: 70 (03/28 1332)  Labs: Recent Labs    07/07/19 1616 07/07/19 1617 07/07/19 2128 07/07/19 2312 07/08/19 0418 07/08/19 1421  HGB 12.7  --   --   --  11.5*  --   HCT 39.8  --   --   --  37.1  --   PLT 328  --   --   --  311  --   HEPARINUNFRC  --   --   --   --  1.01* 0.60  CREATININE 1.03*  --   --   --  0.80  --   TROPONINIHS  --  10 10 11   --   --    Estimated Creatinine Clearance: 55.4 mL/min (by C-G formula based on SCr of 0.8 mg/dL).  Medical History: Past Medical History:  Diagnosis Date  . Family history of heart disease   . Hyperlipidemia   . Hypertension   . Leriche syndrome (HCC)    Medications:  Scheduled:  . amLODipine  2.5 mg Oral Daily  . clopidogrel  75 mg Oral QHS  . diazepam  2.5 mg Oral Daily  . metoprolol tartrate  25 mg Oral BID  . pantoprazole  40 mg Oral Daily  . rosuvastatin  40 mg Oral Daily  . sodium chloride flush  3 mL Intravenous Once   Assessment: 74 [facility]OB, chest pain. Transfer to [facility] ED, CT Angio positive for PE. Pharmacy was asked to dose heparin. 3/28 bilateral LE dopplers neg for DVT Heparin begun 3/27 at 1930 with 4400 unit bolus, infusion at 1100 units/hr Baseline CBC wnl, Ddimer 2.12, SCr 1.03 > 0.8  Today, 07/08/19  0400 Hep level = 1.01, supratherapeutic, rate reduced to 700 units/hr 1421 Hep level = 0.6 units/ml, in therapeutic range  Goal of Therapy:  Heparin level 0.3-0.7 units/ml Monitor platelets by anticoagulation protocol: Yes   Plan:  Continue Heparin at 700  units/hr Recheck Hep level at 2100 Daily CBC, order daily Heparin level at steady state Monitor for s/s bleed  07/10/19 PharmD 07/08/2019,2:56 PM

## 2019-07-08 NOTE — Progress Notes (Signed)
Bilateral lower extremity venous duplex completed. Refer to "CV Proc" under chart review to view preliminary results.  07/08/2019 10:33 AM Eula Fried., MHA, RVT, RDCS, RDMS

## 2019-07-08 NOTE — Progress Notes (Addendum)
[facility] PROGRESS NOTE    Progress Note  Hannah [name]  CZY:[id number] DOB: 1946/04/01 DOA: 07/07/2019 PCP: Ralene Ok, MD     Brief Narrative:   Hannah [name] is an 74 y.o. 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  In the ED she was found to have a D-dimer of 2.1 CT angio of the chest showed no PE.  Assessment/Plan:   Acute Pulmonary emboli (HCC) CT of the chest on 07/07/2019 showed  PE. He was started on IV heparin She is currently not tachycardic not hypotensive she is satting 97% on room air. Check lower extremity Doppler, she is relating some nausea. Physical therapy to ambulate the patient.  Acute Kidney injury: Likely prerenal azotemia, in the setting of diuretic use which was held. Started on IV fluid hydration and his creatinine is almost at baseline.  KVO IV fluids.  Essential hypertension: Continue amlodipine and metoprolol hold diuretic therapy her blood pressure is fairly controlled.  Hyperlipidemia:  Excellent continue statins.  Peripheral arterial disease (HCC) Continue Plavix and statins.  Asymptomatic bacteriuria  DVT prophylaxis: heaprin Family Communication:none Disposition Plan/Barrier to D/C: Home in the morning hopefully if she is tolerating her diet.  Code Status:     Code Status Orders  (From admission, onward)         Start     Ordered   07/07/19 2127  Full code  Continuous     07/07/19 2128        Code Status History    Date Active Date Inactive Code Status Order ID Comments User Context   11/28/2016 0236 11/29/2016 2123 Full Code [id number]  Alberteen Sam, MD Inpatient   Advance Care Planning Activity        IV Access:     Peripheral IV   Procedures and diagnostic studies:   DG Chest 2 View  Result Date: 07/07/2019 CLINICAL DATA:  Chest pain EXAM: CHEST - 2 VIEW COMPARISON:  Chest radiograph dated 11/27/2016. FINDINGS: The heart is mildly enlarged. Trace bilateral pleural effusions with associated atelectasis/airspace disease are noted. There is no pleural effusion. The osseous structures are intact. There is dextrocurvature of the lumbar spine. IMPRESSION: Trace bilateral pleural effusions with associated atelectasis/airspace disease. Electronically Signed   By: Romona Curls M.D.   On: 07/07/2019 17:07   CT Angio Chest PE W/Cm &/Or Wo Cm  Result Date: 07/07/2019 CLINICAL DATA:  Shortness of breath. Elevated D-dimer. EXAM: CT ANGIOGRAPHY CHEST WITH CONTRAST TECHNIQUE: Multidetector CT imaging of the chest was performed using the standard protocol during bolus administration of intravenous contrast. Multiplanar CT image reconstructions and MIPs were obtained to evaluate the vascular anatomy. CONTRAST:  20mL OMNIPAQUE IOHEXOL 350 MG/ML SOLN COMPARISON:  CT abdomen pelvis 08/26/2005 FINDINGS: Cardiovascular: Normal heart size. Trace fluid superior pericardial recess. Aorta and main pulmonary artery are normal in caliber. Motion artifact limits evaluation. Filling defect is demonstrated within the distal right main pulmonary artery branching into the proximal right middle and right lower lobar pulmonary arteries. Filling defect demonstrated within the left lower lobe segmental pulmonary arteries. Filling defect demonstrated within the lingular pulmonary arteries. No CT evidence to suggest right heart strain. Mediastinum/Nodes: No enlarged axillary, mediastinal or hilar lymphadenopathy. Lungs/Pleura: Central airways are patent. Scattered bandlike  areas of consolidation and ground-glass opacities predominately through the lower lobes, right middle lobe and lingula. Small bilateral pleural effusions. No pneumothorax. Upper  Abdomen: Unremarkable Musculoskeletal: Thoracic spine degenerative changes. No aggressive or acute appearing osseous lesions. Review of the MIP images confirms the above findings. IMPRESSION: 1. Findings compatible with acute pulmonary emboli involving the distal right main pulmonary artery branching into the proximal right middle and right lower lobar pulmonary arteries. Filling defects demonstrated within the left lower lobe segmental pulmonary arteries as well as within the lingular pulmonary arteries. No definite CT evidence to suggest acute right heart strain. 2. Small bilateral pleural effusions. 3. Scattered bandlike areas of consolidation and ground-glass opacities predominately through the lower lobes, right middle lobe and lingula which may represent atelectasis or infection. 4. Critical Value/emergent results were called by telephone at the time of interpretation on 07/07/2019 at 6:22 pm to provider MELANIE BELFI , who verbally acknowledged these results. Electronically Signed   By: Annia Belt M.D.   On: 07/07/2019 18:31     Medical Consultants:    None.  Anti-Infectives:   None  Subjective:    Hannah [name] she still relates some nausea and some lower extremity pain.  Objective:    Vitals:   07/07/19 2133 07/08/19 0138 07/08/19 0138 07/08/19 0530  BP: (!) 152/75 (!) 148/64 (!) 148/64 (!) 130/55  Pulse: 79 79 79 69  Resp: 17  18 17   Temp: 98.5 F (36.9 C)  98.9 F (37.2 C) 98.5 F (36.9 C)  TempSrc: Oral  Oral Oral  SpO2: 98%  98% 95%  Weight:      Height:       SpO2: 95 %   Intake/Output Summary (Last 24 hours) at 07/08/2019 07/10/2019 Last data filed at 07/07/2019 2202 Gross per 24 hour  Intake 240 ml  Output 600 ml  Net -360 ml   Filed Weights   07/07/19 1605  Weight: 63 kg    Exam: General exam: In no acute distress. Respiratory system: Good air movement and clear to auscultation. Cardiovascular system: S1 & S2 heard, RRR.  Gastrointestinal system:  Abdomen is nondistended, soft and nontender.  Central nervous system: Alert and oriented. No focal neurological deficits. Extremities: Left lower extremity appears bigger than the right. Skin: No rashes, lesions or ulcers Psychiatry: Judgement and insight appear normal.  Data Reviewed:    Labs: Basic Metabolic Panel: Recent Labs  Lab 07/07/19 1616 07/08/19 0418  NA 137 141  K 3.6 3.5  CL 104 109  CO2 23 22  GLUCOSE 128* 111*  BUN 12 12  CREATININE 1.03* 0.80  CALCIUM 8.7* 8.4*   GFR Estimated Creatinine Clearance: 55.4 mL/min (by C-G formula based on SCr of 0.8 mg/dL). Liver Function Tests: No results for input(s): AST, ALT, ALKPHOS, BILITOT, PROT, ALBUMIN in the last 168 hours. No results for input(s): LIPASE, AMYLASE in the last 168 hours. No results for input(s): AMMONIA in the last 168 hours. Coagulation profile No results for input(s): INR, PROTIME in the last 168 hours. COVID-19 Labs  Recent Labs    07/07/19 1617  DDIMER 2.12*    Lab Results  Component Value Date   SARSCOV2NAA NEGATIVE 07/07/2019    CBC: Recent Labs  Lab 07/07/19 1616 07/08/19 0418  WBC 8.9 8.2  HGB 12.7 11.5*  HCT 39.8 37.1  MCV 87.1 89.0  PLT 328 311   Cardiac Enzymes: No results for input(s): CKTOTAL, CKMB, CKMBINDEX, TROPONINI in the last 168 hours. BNP (last 3  results) No results for input(s): PROBNP in the last 8760 hours. CBG: No results for input(s): GLUCAP in the last 168 hours. D-Dimer: Recent Labs    07/07/19 1617  DDIMER 2.12*   Hgb A1c: No results for input(s): HGBA1C in the last 72 hours. Lipid Profile: No results for input(s): CHOL, HDL, LDLCALC, TRIG, CHOLHDL, LDLDIRECT in the last 72 hours. Thyroid function studies: No results for input(s): TSH, T4TOTAL, T3FREE, THYROIDAB in the last 72 hours.  Invalid input(s): FREET3 Anemia work up: No results for input(s): VITAMINB12, FOLATE, FERRITIN, TIBC, IRON, RETICCTPCT in the last 72 hours. Sepsis  Labs: Recent Labs  Lab 07/07/19 1616 07/08/19 0418  WBC 8.9 8.2   Microbiology Recent Results (from the past 240 hour(s))  SARS CORONAVIRUS 2 (TAT 6-24 HRS) Nasopharyngeal Nasopharyngeal Swab     Status: None   Collection Time: 07/07/19  5:23 PM   Specimen: Nasopharyngeal Swab  Result Value Ref Range Status   SARS Coronavirus 2 NEGATIVE NEGATIVE Final    Comment: (NOTE) SARS-CoV-2 target nucleic acids are NOT DETECTED. The SARS-CoV-2 RNA is generally detectable in upper and lower respiratory specimens during the acute phase of infection. Negative results do not preclude SARS-CoV-2 infection, do not rule out co-infections with other pathogens, and should not be used as the sole basis for treatment or other patient management decisions. Negative results must be combined with clinical observations, patient history, and epidemiological information. The expected result is Negative. Fact Sheet for Patients: SugarRoll.be Fact Sheet for Healthcare Providers: https://www.woods-mathews.com/ This test is not yet approved or cleared by the [place] FDA and  has been authorized for detection and/or diagnosis of SARS-CoV-2 by FDA under an Emergency Use Authorization (EUA). This EUA will remain  in effect (meaning this test can be used) for the duration of the COVID-19 declaration under Section 56 4(b)(1) of the Act, 21 U.S.C. section 360bbb-3(b)(1), unless the authorization is terminated or revoked sooner. Performed at [facility], [place] [address], [place], [place] [address]      Medications:   . amLODipine  2.5 mg Oral Daily  . clopidogrel  75 mg Oral QHS  . diazepam  2.5 mg Oral Daily  . metoprolol tartrate  25 mg Oral BID  . pantoprazole  40 mg Oral Daily  . rosuvastatin  40 mg Oral Daily  . sodium chloride flush  3 mL Intravenous Once   Continuous Infusions: . sodium chloride 500 mL (07/07/19 1923)  . heparin 700 Units/hr  (07/08/19 0557)      LOS: 1 day   Charlynne Cousins  [facility]  07/08/2019, 8:08 AM

## 2019-07-09 ENCOUNTER — Telehealth: Payer: Self-pay | Admitting: Cardiovascular Disease

## 2019-07-09 DIAGNOSIS — I2693 Single subsegmental pulmonary embolism without acute cor pulmonale: Secondary | ICD-10-CM

## 2019-07-09 LAB — CBC
HCT: 35.5 % — ABNORMAL LOW (ref 36.0–46.0)
Hemoglobin: 11.2 g/dL — ABNORMAL LOW (ref 12.0–15.0)
MCH: 27.5 pg (ref 26.0–34.0)
MCHC: 31.5 g/dL (ref 30.0–36.0)
MCV: 87 fL (ref 80.0–100.0)
Platelets: 298 10*3/uL (ref 150–400)
RBC: 4.08 MIL/uL (ref 3.87–5.11)
RDW: 14.3 % (ref 11.5–15.5)
WBC: 6.4 10*3/uL (ref 4.0–10.5)
nRBC: 0 % (ref 0.0–0.2)

## 2019-07-09 LAB — HEPARIN LEVEL (UNFRACTIONATED): Heparin Unfractionated: 0.36 IU/mL (ref 0.30–0.70)

## 2019-07-09 MED ORDER — APIXABAN (ELIQUIS) VTE STARTER PACK (10MG AND 5MG): ORAL_TABLET | ORAL | 0 refills | Status: DC

## 2019-07-09 MED ORDER — APIXABAN 5 MG PO TABS: 5.0000 mg | ORAL_TABLET | Freq: Two times a day (BID) | ORAL | 2 refills | Status: DC

## 2019-07-09 MED ORDER — APIXABAN 5 MG PO TABS
10.0000 mg | ORAL_TABLET | Freq: Two times a day (BID) | ORAL | Status: DC
  Administered 2019-07-09: 10 mg via ORAL
  Filled 2019-07-09 (×2): qty 2

## 2019-07-09 NOTE — Discharge Summary (Signed)
Physician Discharge Summary  Hannah [name] ZOX:[id number] DOB: 07/09/1945 DOA: 07/07/2019  PCP: Ralene OkMoreira, Roy, MD  Admit date: 07/07/2019 Discharge date: 07/09/2019  Admitted From: Home Disposition:  Home  Recommendations for Outpatient Follow-up:  1. Follow up with PCP in 1-2 weeks 2. Please obtain BMP/CBC in one week   Home Health:No Equipment/Devices:None Discharge Condition:Stable CODE STATUS:Full Diet recommendation: Heart Healthy  Brief/Interim Summary: 74 y.o. 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  In the ED she was found to have a D-dimer of 2.1 CT angio of the chest showed no PE.  Discharge Diagnoses:  Principal Problem:   Pulmonary emboli [facility](HCC) Active Problems:   Peripheral arterial disease (HCC)   Essential hypertension   Dyslipidemia   Acute pulmonary embolism (HCC)   Asymptomatic bacteriuria Acute pulmonary embolism: CT angio of the chest done on 07/07/2019 showed a PE, she was started on IV heparin. Lower extremity Dopplers was negative for DVT. She remained normotensive and satting greater than 97% on room air.  Acute kidney injury: Likely prerenal azotemia in the setting of diuretic use it was held on admission started on IV fluid hydration her creatinine returned to baseline. She will resume her medication as an outpatient.  Essential hypertension no changes made were made to his medication.  Hyperlipidemia: Continue statins.  Peripheral artery disease: Continue Plavix and statins.  Asymptomatic bacteriuria.   Discharge Instructions  Discharge Instructions    Diet - low sodium heart healthy   Complete by: As directed    Increase activity slowly   Complete by: As  directed      Allergies as of 07/09/2019      Reactions   Amoxicillin Swelling   Ramipril Swelling   Angioedema      Medication List    TAKE these medications   amLODipine 2.5 MG tablet Commonly known as: NORVASC Take 1 tablet (2.5 mg total) by mouth daily.   Apixaban Starter Pack 5 MG Tbpk Commonly known as: ELIQUIS STARTER PACK Take as directed on package: start with two-5mg  tablets twice daily for 7 days. On day 8, switch to one-5mg  tablet twice daily.   apixaban 5 MG Tabs tablet Commonly known as: ELIQUIS Take 1 tablet (5 mg total) by mouth 2 (two) times daily. Start taking on: July 16, 2019   aspirin EC 81 MG tablet Take 324 mg by mouth daily as needed for mild pain.   clopidogrel 75 MG tablet Commonly known as: PLAVIX Take 75 mg by mouth at bedtime.   Coenzyme Q-10 200 MG Caps Take 200 mg by mouth daily.   Dexilant 60 MG capsule Generic drug: dexlansoprazole Take 60 mg by mouth daily.   diazepam 5 MG tablet Commonly known as: VALIUM Take 2.5 mg by mouth daily.   furosemide 20 MG tablet Commonly known as: LASIX Take 20 mg by mouth daily.   metoprolol tartrate 25 MG tablet Commonly known as: LOPRESSOR TAKE 1 TABLET(25 MG) BY MOUTH TWICE DAILY What changed:   how much to take  how to take this  when to take this  additional instructions   rosuvastatin 40 MG tablet Commonly known as: CRESTOR Take 40 mg by mouth daily.   Vitamin D (Ergocalciferol) 1.25 MG (50000 UNIT) Caps capsule Commonly known  as: DRISDOL Take 50,000 Units by mouth every Wednesday.       Allergies  Allergen Reactions  . Amoxicillin Swelling  . Ramipril Swelling    Angioedema     Consultations:  none   Procedures/Studies: DG Chest 2 View  Result Date: 07/07/2019 CLINICAL DATA:  Chest pain EXAM: CHEST - 2 VIEW COMPARISON:  Chest radiograph dated 11/27/2016. FINDINGS: The heart is mildly enlarged. Trace bilateral pleural effusions with associated  atelectasis/airspace disease are noted. There is no pleural effusion. The osseous structures are intact. There is dextrocurvature of the lumbar spine. IMPRESSION: Trace bilateral pleural effusions with associated atelectasis/airspace disease. Electronically Signed   By: Romona Curls M.D.   On: 07/07/2019 17:07   CT Angio Chest PE W/Cm &/Or Wo Cm  Result Date: 07/07/2019 CLINICAL DATA:  Shortness of breath. Elevated D-dimer. EXAM: CT ANGIOGRAPHY CHEST WITH CONTRAST TECHNIQUE: Multidetector CT imaging of the chest was performed using the standard protocol during bolus administration of intravenous contrast. Multiplanar CT image reconstructions and MIPs were obtained to evaluate the vascular anatomy. CONTRAST:  54mL OMNIPAQUE IOHEXOL 350 MG/ML SOLN COMPARISON:  CT abdomen pelvis 08/26/2005 FINDINGS: Cardiovascular: Normal heart size. Trace fluid superior pericardial recess. Aorta and main pulmonary artery are normal in caliber. Motion artifact limits evaluation. Filling defect is demonstrated within the distal right main pulmonary artery branching into the proximal right middle and right lower lobar pulmonary arteries. Filling defect demonstrated within the left lower lobe segmental pulmonary arteries. Filling defect demonstrated within the lingular pulmonary arteries. No CT evidence to suggest right heart strain. Mediastinum/Nodes: No enlarged axillary, mediastinal or hilar lymphadenopathy. Lungs/Pleura: Central airways are patent. Scattered bandlike areas of consolidation and ground-glass opacities predominately through the lower lobes, right middle lobe and lingula. Small bilateral pleural effusions. No pneumothorax. Upper Abdomen: Unremarkable Musculoskeletal: Thoracic spine degenerative changes. No aggressive or acute appearing osseous lesions. Review of the MIP images confirms the above findings. IMPRESSION: 1. Findings compatible with acute pulmonary emboli involving the distal right main pulmonary artery  branching into the proximal right middle and right lower lobar pulmonary arteries. Filling defects demonstrated within the left lower lobe segmental pulmonary arteries as well as within the lingular pulmonary arteries. No definite CT evidence to suggest acute right heart strain. 2. Small bilateral pleural effusions. 3. Scattered bandlike areas of consolidation and ground-glass opacities predominately through the lower lobes, right middle lobe and lingula which may represent atelectasis or infection. 4. Critical Value/emergent results were called by telephone at the time of interpretation on 07/07/2019 at 6:22 pm to provider MELANIE BELFI , who verbally acknowledged these results. Electronically Signed   By: Annia Belt M.D.   On: 07/07/2019 18:31   VAS [place] ABI WITH/WO TBI  Result Date: 06/13/2019 LOWER EXTREMITY DOPPLER STUDY Indications: Peripheral artery disease, and She has chronic claudication              symptoms, however patient reports no worsening symptoms today.              Patient has noticed intermittent flare ups with the right calf              where it will ache for a day or two over the past year. She states              this happens out of the blue and not with exercise. High Risk Factors: Hypertension, hyperlipidemia, past history of smoking. 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  Comparison Study: Prior ABI 06/13/2018 right .69 left .74 Performing Technologist: Carlos American RVT, RDCS (AE), RDMS  Examination Guidelines: A complete evaluation includes at minimum, Doppler waveform signals and systolic blood pressure reading at the level of bilateral brachial, anterior tibial, and posterior tibial arteries, when vessel segments are accessible. Bilateral testing is considered an integral part of a complete examination. Photoelectric  Plethysmograph (PPG) waveforms and toe systolic pressure readings are included as required and additional duplex testing as needed. Limited examinations for reoccurring indications may be performed as noted.  ABI Findings: +---------+------------------+-----+----------+--------+ Right    Rt Pressure (mmHg)IndexWaveform  Comment  +---------+------------------+-----+----------+--------+ Brachial 151                                       +---------+------------------+-----+----------+--------+ ATA      93                0.60 monophasic         +---------+------------------+-----+----------+--------+ PTA      109               0.70 monophasic         +---------+------------------+-----+----------+--------+ PERO     93                0.60 monophasic         +---------+------------------+-----+----------+--------+ Great Toe94                0.60 Abnormal           +---------+------------------+-----+----------+--------+ +---------+------------------+-----+--------+-------+ Left     Lt Pressure (mmHg)IndexWaveformComment +---------+------------------+-----+--------+-------+ Brachial 156                                    +---------+------------------+-----+--------+-------+ ATA      90                0.58 biphasic        +---------+------------------+-----+--------+-------+ PTA      107               0.69 biphasic        +---------+------------------+-----+--------+-------+ PERO     97                0.62 biphasic        +---------+------------------+-----+--------+-------+ Great Toe94                0.60 Abnormal        +---------+------------------+-----+--------+-------+ +-------+-----------+-----------+------------+------------+ ABI/TBIToday's ABIToday's TBIPrevious ABIPrevious TBI +-------+-----------+-----------+------------+------------+ Right  .70        .60        .69         .64           +-------+-----------+-----------+------------+------------+ Left   .69        .60        .74         .67          +-------+-----------+-----------+------------+------------+ Bilateral ABIs and TBIs appear essentially unchanged compared to prior study on 06/13/2018.  Summary: Right: Resting right ankle-brachial index indicates moderate right lower extremity arterial disease. The right toe-brachial index is abnormal. Left: Resting left ankle-brachial index indicates moderate left lower  extremity arterial disease. The left toe-brachial index is abnormal.  *See table(s) above for measurements and observations.  Suggest follow up study in 12 months. Electronically signed by Ida Rogue MD on 06/13/2019 at 10:08:06 PM.    Final    VAS [place] LOWER EXTREMITY VENOUS (DVT)  Result Date: 07/08/2019  Lower Venous DVTStudy Indications: Swelling, and pulmonary embolism.  Comparison Study: No prior study Performing Technologist: Maudry Mayhew MHA, RDMS, RVT, RDCS  Examination Guidelines: A complete evaluation includes B-mode imaging, spectral Doppler, color Doppler, and power Doppler as needed of all accessible portions of each vessel. Bilateral testing is considered an integral part of a complete examination. Limited examinations for reoccurring indications may be performed as noted. The reflux portion of the exam is performed with the patient in reverse Trendelenburg.  +---------+---------------+---------+-----------+----------+--------------+ RIGHT    CompressibilityPhasicitySpontaneityPropertiesThrombus Aging +---------+---------------+---------+-----------+----------+--------------+ CFV      Full           Yes      Yes                                 +---------+---------------+---------+-----------+----------+--------------+ SFJ      Full                                                        +---------+---------------+---------+-----------+----------+--------------+ FV Prox  Full                                                         +---------+---------------+---------+-----------+----------+--------------+ FV Mid   Full                                                        +---------+---------------+---------+-----------+----------+--------------+ FV DistalFull                                                        +---------+---------------+---------+-----------+----------+--------------+ PFV      Full                                                        +---------+---------------+---------+-----------+----------+--------------+ POP      Full           Yes      Yes                                 +---------+---------------+---------+-----------+----------+--------------+ PTV      Full                                                        +---------+---------------+---------+-----------+----------+--------------+  PERO     Full                                                        +---------+---------------+---------+-----------+----------+--------------+   +---------+---------------+---------+-----------+----------+--------------+ LEFT     CompressibilityPhasicitySpontaneityPropertiesThrombus Aging +---------+---------------+---------+-----------+----------+--------------+ CFV      Full           Yes      Yes                                 +---------+---------------+---------+-----------+----------+--------------+ SFJ      Full                                                        +---------+---------------+---------+-----------+----------+--------------+ FV Prox  Full                                                        +---------+---------------+---------+-----------+----------+--------------+ FV Mid   Full                                                        +---------+---------------+---------+-----------+----------+--------------+ FV DistalFull                                                         +---------+---------------+---------+-----------+----------+--------------+ PFV      Full                                                        +---------+---------------+---------+-----------+----------+--------------+ POP      Full           Yes      Yes                                 +---------+---------------+---------+-----------+----------+--------------+ PTV      Full                                                        +---------+---------------+---------+-----------+----------+--------------+ PERO     Full                                                        +---------+---------------+---------+-----------+----------+--------------+  Summary: RIGHT: - There is no evidence of deep vein thrombosis in the lower extremity.  - No cystic structure found in the popliteal fossa.  LEFT: - There is no evidence of deep vein thrombosis in the lower extremity.  - No cystic structure found in the popliteal fossa.  *See table(s) above for measurements and observations. Electronically signed by Waverly Ferrari MD on 07/08/2019 at 1:46:48 PM.    Final      Subjective: No complaints.  Discharge Exam: Vitals:   07/08/19 2107 07/09/19 0446  BP: 132/64 (!) 137/54  Pulse: 82 64  Resp: 18 20  Temp: 98.2 F (36.8 C) 98.6 F (37 C)  SpO2: 100% 97%   Vitals:   07/08/19 1332 07/08/19 1803 07/08/19 2107 07/09/19 0446  BP: (!) 118/52 (!) 138/58 132/64 (!) 137/54  Pulse: 70 73 82 64  Resp: 17 (!) 24 18 20   Temp: 97.9 F (36.6 C) 98.1 F (36.7 C) 98.2 F (36.8 C) 98.6 F (37 C)  TempSrc: Oral Oral Oral Oral  SpO2: 97% 98% 100% 97%  Weight:      Height:        General: Pt is alert, awake, not in acute distress Cardiovascular: RRR, S1/S2 +, no rubs, no gallops Respiratory: CTA bilaterally, no wheezing, no rhonchi Abdominal: Soft, NT, ND, bowel sounds + Extremities: no edema, no cyanosis    The results of significant diagnostics from this hospitalization  (including imaging, microbiology, ancillary and laboratory) are listed below for reference.     Microbiology: Recent Results (from the past 240 hour(s))  SARS CORONAVIRUS 2 (TAT 6-24 HRS) Nasopharyngeal Nasopharyngeal Swab     Status: None   Collection Time: 07/07/19  5:23 PM   Specimen: Nasopharyngeal Swab  Result Value Ref Range Status   SARS Coronavirus 2 NEGATIVE NEGATIVE Final    Comment: (NOTE) SARS-CoV-2 target nucleic acids are NOT DETECTED. The SARS-CoV-2 RNA is generally detectable in upper and lower respiratory specimens during the acute phase of infection. Negative results do not preclude SARS-CoV-2 infection, do not rule out co-infections with other pathogens, and should not be used as the sole basis for treatment or other patient management decisions. Negative results must be combined with clinical observations, patient history, and epidemiological information. The expected result is Negative. Fact Sheet for Patients: 07/09/19 Fact Sheet for Healthcare Providers: HairSlick.no This test is not yet approved or cleared by the [place] FDA and  has been authorized for detection and/or diagnosis of SARS-CoV-2 by FDA under an Emergency Use Authorization (EUA). This EUA will remain  in effect (meaning this test can be used) for the duration of the COVID-19 declaration under Section 56 4(b)(1) of the Act, 21 U.S.C. section 360bbb-3(b)(1), unless the authorization is terminated or revoked sooner. Performed at [facility], [address] [place], [place]      Labs: BNP (last 3 results) Recent Labs    07/07/19 1617  BNP 25.7   Basic Metabolic Panel: Recent Labs  Lab 07/07/19 1616 07/08/19 0418  NA 137 141  K 3.6 3.5  CL 104 109  CO2 23 22  GLUCOSE 128* 111*  BUN 12 12  CREATININE 1.03* 0.80  CALCIUM 8.7* 8.4*   Liver Function Tests: No results for input(s): AST, ALT,  ALKPHOS, BILITOT, PROT, ALBUMIN in the last 168 hours. No results for input(s): LIPASE, AMYLASE in the last 168 hours. No results for input(s): AMMONIA in the last 168 hours. CBC: Recent Labs  Lab 07/07/19 1616  07/08/19 0418 07/09/19 0508  WBC 8.9 8.2 6.4  HGB 12.7 11.5* 11.2*  HCT 39.8 37.1 35.5*  MCV 87.1 89.0 87.0  PLT 328 311 298   Cardiac Enzymes: No results for input(s): CKTOTAL, CKMB, CKMBINDEX, TROPONINI in the last 168 hours. BNP: Invalid input(s): POCBNP CBG: No results for input(s): GLUCAP in the last 168 hours. D-Dimer Recent Labs    07/07/19 1617  DDIMER 2.12*   Hgb A1c No results for input(s): HGBA1C in the last 72 hours. Lipid Profile No results for input(s): CHOL, HDL, LDLCALC, TRIG, CHOLHDL, LDLDIRECT in the last 72 hours. Thyroid function studies No results for input(s): TSH, T4TOTAL, T3FREE, THYROIDAB in the last 72 hours.  Invalid input(s): FREET3 Anemia work up No results for input(s): VITAMINB12, FOLATE, FERRITIN, TIBC, IRON, RETICCTPCT in the last 72 hours. Urinalysis    Component Value Date/Time   COLORURINE YELLOW 07/07/2019 1722   APPEARANCEUR CLEAR 07/07/2019 1722   LABSPEC 1.010 07/07/2019 1722   PHURINE 6.0 07/07/2019 1722   GLUCOSEU NEGATIVE 07/07/2019 1722   HGBUR TRACE (A) 07/07/2019 1722   BILIRUBINUR NEGATIVE 07/07/2019 1722   KETONESUR NEGATIVE 07/07/2019 1722   PROTEINUR NEGATIVE 07/07/2019 1722   NITRITE NEGATIVE 07/07/2019 1722   LEUKOCYTESUR MODERATE (A) 07/07/2019 1722   Sepsis Labs Invalid input(s): PROCALCITONIN,  WBC,  LACTICIDVEN Microbiology Recent Results (from the past 240 hour(s))  SARS CORONAVIRUS 2 (TAT 6-24 HRS) Nasopharyngeal Nasopharyngeal Swab     Status: None   Collection Time: 07/07/19  5:23 PM   Specimen: Nasopharyngeal Swab  Result Value Ref Range Status   SARS Coronavirus 2 NEGATIVE NEGATIVE Final    Comment: (NOTE) SARS-CoV-2 target nucleic acids are NOT DETECTED. The SARS-CoV-2 RNA is generally  detectable in upper and lower respiratory specimens during the acute phase of infection. Negative results do not preclude SARS-CoV-2 infection, do not rule out co-infections with other pathogens, and should not be used as the sole basis for treatment or other patient management decisions. Negative results must be combined with clinical observations, patient history, and epidemiological information. The expected result is Negative. Fact Sheet for Patients: HairSlick.no Fact Sheet for Healthcare Providers: quierodirigir.com This test is not yet approved or cleared by the [place] FDA and  has been authorized for detection and/or diagnosis of SARS-CoV-2 by FDA under an Emergency Use Authorization (EUA). This EUA will remain  in effect (meaning this test can be used) for the duration of the COVID-19 declaration under Section 56 4(b)(1) of the Act, 21 U.S.C. section 360bbb-3(b)(1), unless the authorization is terminated or revoked sooner. Performed at [facility], [address], [place], [place] [address]      Time coordinating discharge: Over 30 minutes  SIGNED:   Marinda Elk, MD  [facility] 07/09/2019, 9:16 AM Pager   If 7PM-7AM, please contact night-coverage [email or web address] Password [email or web address]

## 2019-07-09 NOTE — Telephone Encounter (Signed)
Contacted patient- advised that they would normally schedule the appointment to see [place] while she was in the hospital.  Patient verbalized understanding. She will wait to see what they decide in the hospital.

## 2019-07-09 NOTE — Telephone Encounter (Signed)
Patient is currently at [facility] for blood clots in her lungs. She would like to know what Dr. Allyson Sabal recommends as far as her coming into the office to see him once she is discharged.

## 2019-07-09 NOTE — Discharge Instructions (Signed)
Information on my medicine - ELIQUIS (apixaban)  This medication education was reviewed with me or my healthcare representative as part of my discharge preparation.  The pharmacist that spoke with me during my hospital stay was:   Why was Eliquis prescribed for you? Eliquis was prescribed to treat blood clots that may have been found in the veins of your legs (deep vein thrombosis) or in your lungs (pulmonary embolism) and to reduce the risk of them occurring again.  What do You need to know about Eliquis ? The starting dose is 10 mg (two 5 mg tablets) taken TWICE daily for the FIRST SEVEN (7) DAYS, then on (enter date)  4/5  the dose is reduced to ONE 5 mg tablet taken TWICE daily.  Eliquis may be taken with or without food.   Try to take the dose about the same time in the morning and in the evening. If you have difficulty swallowing the tablet whole please discuss with your pharmacist how to take the medication safely.  Take Eliquis exactly as prescribed and DO NOT stop taking Eliquis without talking to the doctor who prescribed the medication.  Stopping may increase your risk of developing a new blood clot.  Refill your prescription before you run out.  After discharge, you should have regular check-up appointments with your healthcare provider that is prescribing your Eliquis.    What do you do if you miss a dose? If a dose of ELIQUIS is not taken at the scheduled time, take it as soon as possible on the same day and twice-daily administration should be resumed. The dose should not be doubled to make up for a missed dose.  Important Safety Information A possible side effect of Eliquis is bleeding. You should call your healthcare provider right away if you experience any of the following: ? Bleeding from an injury or your nose that does not stop. ? Unusual colored urine (red or dark brown) or unusual colored stools (red or black). ? Unusual bruising for unknown reasons. ? A  serious fall or if you hit your head (even if there is no bleeding).  Some medicines may interact with Eliquis and might increase your risk of bleeding or clotting while on Eliquis. To help avoid this, consult your healthcare provider or pharmacist prior to using any new prescription or non-prescription medications, including herbals, vitamins, non-steroidal anti-inflammatory drugs (NSAIDs) and supplements.  This website has more information on Eliquis (apixaban): http://www.eliquis.com/eliquis/home

## 2019-07-09 NOTE — Evaluation (Signed)
Physical Therapy Evaluation Patient Details Name: Hannah [name] MRN: [id number] DOB: 09/30/1945 Today's Date: 07/09/2019    SATURATION QUALIFICATIONS: (This note is used to comply with regulatory documentation for home oxygen)  Patient Saturations on Room Air at Rest = 95%  Patient Saturations on Room Air while Ambulating = 90%  Patient Saturations on  Liters of oxygen while Ambulating = n/a     History of Present Illness  74 yo female admitted with acute PE, AKI. Hx of leriche syndrome  Clinical Impression  On eval, pt required Min guard-Min assist for mobility. She was anxious and a bit fearful during session. She tolerated activity well. Dyspnea 2/4. She is a bit unsteady so will recommend a RW for home use until she returns to baseline. Discussed d/c plan-she will return home where she lives with her husband. At time of eval, she was unsure if she wanted HHPT f/u. Will continue to follow during hospital stay.    Follow Up Recommendations Home health PT;Supervision/Assistance - 24 hour (if pt decides she wants it)    Equipment Recommendations  Rolling walker with 5" wheels    Recommendations for Other Services       Precautions / Restrictions Precautions Precautions: Fall Restrictions Weight Bearing Restrictions: No      Mobility  Bed Mobility               General bed mobility comments: sitting EOB at start of session  Transfers Overall transfer level: Needs assistance   Transfers: Sit to/from Stand Sit to Stand: Supervision         General transfer comment: for safety. increased time..  Ambulation/Gait Ambulation/Gait assistance: Min guard;Min assist Gait Distance (Feet): 125 Feet Assistive device: None Gait Pattern/deviations: Step-through pattern;Decreased stride length;Drifts right/left;Staggering left;Staggering right     General Gait Details: very close guarding with intermittent assist to steady. pt took very cautious small steps  throughout distance. cues for safety, posture, and increased step lengths bilaterally. pt tolerated distance well. O2 90% on RA  Stairs            Wheelchair Mobility    Modified Rankin (Stroke Patients Only)       Balance Overall balance assessment: Needs assistance         Standing balance support: No upper extremity supported Standing balance-Leahy Scale: Fair                               Pertinent Vitals/Pain Pain Assessment: Faces Faces Pain Scale: Hurts little more Pain Location: chest Pain Descriptors / Indicators: Discomfort;Sore Pain Intervention(s): Limited activity within patient's tolerance;Monitored during session    Home Living Family/patient expects to be discharged to:: Private residence Living Arrangements: Spouse/significant other   Type of Home: House Home Access: Stairs to enter Entrance Stairs-Rails: Right Entrance Stairs-Number of Steps: 1 flight Home Layout: Multi-level Home Equipment: None      Prior Function Level of Independence: Independent               Hand Dominance        Extremity/Trunk Assessment   Upper Extremity Assessment Upper Extremity Assessment: Overall WFL for tasks assessed    Lower Extremity Assessment Lower Extremity Assessment: Generalized weakness    Cervical / Trunk Assessment Cervical / Trunk Assessment: Normal  Communication   Communication: No difficulties  Cognition Arousal/Alertness: Awake/alert Behavior During Therapy: Anxious Overall Cognitive Status: Within Functional Limits for tasks assessed  General Comments      Exercises     Assessment/Plan    PT Assessment Patient needs continued PT services  PT Problem List Decreased strength;Decreased mobility;Decreased balance;Decreased activity tolerance;Decreased knowledge of use of DME       PT Treatment Interventions DME instruction;Gait training;Therapeutic  activities;Therapeutic exercise;Patient/family education;Balance training;Functional mobility training    PT Goals (Current goals can be found in the Care Plan section)  Acute Rehab PT Goals Patient Stated Goal: to get/feel better PT Goal Formulation: With patient Time For Goal Achievement: 07/23/19 Potential to Achieve Goals: Good    Frequency Min 3X/week   Barriers to discharge        Co-evaluation               AM-PAC PT "6 Clicks" Mobility  Outcome Measure Help needed turning from your back to your side while in a flat bed without using bedrails?: None Help needed moving from lying on your back to sitting on the side of a flat bed without using bedrails?: None Help needed moving to and from a bed to a chair (including a wheelchair)?: A Little Help needed standing up from a chair using your arms (e.g., wheelchair or bedside chair)?: A Little Help needed to walk in hospital room?: A Little Help needed climbing 3-5 steps with a railing? : A Little 6 Click Score: 20    End of Session Equipment Utilized During Treatment: Gait belt Activity Tolerance: Patient tolerated treatment well Patient left: in bed;with call bell/phone within reach;with bed alarm set   PT Visit Diagnosis: Unsteadiness on feet (R26.81);Muscle weakness (generalized) (M62.81)    Time: 0930-1007 PT Time Calculation (min) (ACUTE ONLY): 37 min   Charges:   PT Evaluation $PT Eval Low Complexity: 1 Low PT Treatments $Gait Training: 8-22 mins           Faye Ramsay, PT Acute Rehabilitation

## 2019-08-01 ENCOUNTER — Emergency Department (HOSPITAL_COMMUNITY)
Admission: EM | Admit: 2019-08-01 | Discharge: 2019-08-01 | Disposition: A | Discharge status: Home / Self Care | Payer: Medicare Other | Attending: Emergency Medicine | Admitting: Emergency Medicine

## 2019-08-01 ENCOUNTER — Telehealth: Payer: Self-pay | Admitting: Cardiovascular Disease

## 2019-08-01 ENCOUNTER — Emergency Department (HOSPITAL_COMMUNITY): Payer: Medicare Other

## 2019-08-01 ENCOUNTER — Other Ambulatory Visit: Payer: Self-pay

## 2019-08-01 ENCOUNTER — Encounter (HOSPITAL_COMMUNITY): Payer: Self-pay

## 2019-08-01 DIAGNOSIS — Z79899 Other long term (current) drug therapy: Secondary | ICD-10-CM | POA: Insufficient documentation

## 2019-08-01 DIAGNOSIS — M545 Low back pain, unspecified: Secondary | ICD-10-CM

## 2019-08-01 DIAGNOSIS — Z7982 Long term (current) use of aspirin: Secondary | ICD-10-CM | POA: Diagnosis not present

## 2019-08-01 DIAGNOSIS — R1032 Left lower quadrant pain: Secondary | ICD-10-CM | POA: Diagnosis present

## 2019-08-01 DIAGNOSIS — I10 Essential (primary) hypertension: Secondary | ICD-10-CM | POA: Insufficient documentation

## 2019-08-01 DIAGNOSIS — G8929 Other chronic pain: Secondary | ICD-10-CM

## 2019-08-01 DIAGNOSIS — Z7901 Long term (current) use of anticoagulants: Secondary | ICD-10-CM | POA: Insufficient documentation

## 2019-08-01 DIAGNOSIS — Z87891 Personal history of nicotine dependence: Secondary | ICD-10-CM | POA: Diagnosis not present

## 2019-08-01 LAB — URINALYSIS, ROUTINE W REFLEX MICROSCOPIC
Bilirubin Urine: NEGATIVE
Glucose, UA: NEGATIVE mg/dL
Hgb urine dipstick: NEGATIVE
Ketones, ur: NEGATIVE mg/dL
Leukocytes,Ua: NEGATIVE
Nitrite: NEGATIVE
Protein, ur: NEGATIVE mg/dL
Specific Gravity, Urine: 1.013 (ref 1.005–1.030)
pH: 5 (ref 5.0–8.0)

## 2019-08-01 LAB — COMPREHENSIVE METABOLIC PANEL
ALT: 13 U/L (ref 0–44)
AST: 16 U/L (ref 15–41)
Albumin: 3.9 g/dL (ref 3.5–5.0)
Alkaline Phosphatase: 125 U/L (ref 38–126)
Anion gap: 8 (ref 5–15)
BUN: 17 mg/dL (ref 8–23)
CO2: 24 mmol/L (ref 22–32)
Calcium: 9 mg/dL (ref 8.9–10.3)
Chloride: 107 mmol/L (ref 98–111)
Creatinine, Ser: 0.93 mg/dL (ref 0.44–1.00)
GFR calc Af Amer: >60 mL/min (ref >60)
GFR calc non Af Amer: >60 mL/min (ref >60)
Glucose, Bld: 103 mg/dL — ABNORMAL HIGH (ref 70–99)
Potassium: 4.1 mmol/L (ref 3.5–5.1)
Sodium: 139 mmol/L (ref 135–145)
Total Bilirubin: 0.4 mg/dL (ref 0.3–1.2)
Total Protein: 7.3 g/dL (ref 6.5–8.1)

## 2019-08-01 LAB — CBC WITH DIFFERENTIAL/PLATELET
Abs Immature Granulocytes: 0.01 10*3/uL (ref 0.00–0.07)
Basophils Absolute: 0 10*3/uL (ref 0.0–0.1)
Basophils Relative: 0 %
Eosinophils Absolute: 0.1 10*3/uL (ref 0.0–0.5)
Eosinophils Relative: 1 %
HCT: 38.7 % (ref 36.0–46.0)
Hemoglobin: 12.3 g/dL (ref 12.0–15.0)
Immature Granulocytes: 0 %
Lymphocytes Relative: 37 %
Lymphs Abs: 2 10*3/uL (ref 0.7–4.0)
MCH: 28.1 pg (ref 26.0–34.0)
MCHC: 31.8 g/dL (ref 30.0–36.0)
MCV: 88.4 fL (ref 80.0–100.0)
Monocytes Absolute: 0.5 10*3/uL (ref 0.1–1.0)
Monocytes Relative: 10 %
Neutro Abs: 2.8 10*3/uL (ref 1.7–7.7)
Neutrophils Relative %: 52 %
Platelets: 367 10*3/uL (ref 150–400)
RBC: 4.38 MIL/uL (ref 3.87–5.11)
RDW: 14.2 % (ref 11.5–15.5)
WBC: 5.4 10*3/uL (ref 4.0–10.5)
nRBC: 0 % (ref 0.0–0.2)

## 2019-08-01 MED ORDER — HYDROCODONE-ACETAMINOPHEN 5-325 MG PO TABS: 1.0000 | ORAL_TABLET | Freq: Four times a day (QID) | ORAL | 0 refills | Status: DC | PRN

## 2019-08-01 MED ORDER — SODIUM CHLORIDE (PF) 0.9 % IJ SOLN
INTRAMUSCULAR | Status: AC
  Filled 2019-08-01: qty 50

## 2019-08-01 MED ORDER — IOHEXOL 300 MG/ML  SOLN
100.0000 mL | Freq: Once | INTRAMUSCULAR | Status: AC | PRN
  Administered 2019-08-01: 100 mL via INTRAVENOUS

## 2019-08-01 NOTE — Telephone Encounter (Signed)
Spoke to pt who report pain in her chest when she breath, SOB, feeling lightheaded and pain in left side down to groin. Pt report she recently had blood clot and worried that she may have another one. Pt advised to report to ER for further evaluations. Pt verbalized understanding.

## 2019-08-01 NOTE — ED Triage Notes (Signed)
Pt reports L sided groin and back pain. Reports that she was admitted 3 weeks ago for PE. She reports compliance with her blood thinners, but is concerned about another clot. Denies hematuria.

## 2019-08-01 NOTE — ED Provider Notes (Signed)
[facility] DEPT Provider Note   CSN: [id number] Arrival date & time: 08/01/19  1928     History Chief Complaint  Patient presents with  . Groin Pain  . Back Pain    Hannah [name] is a 74 y.o. female.  Patient complains of left flank pain left lower quadrant abdominal pain some pain in her left leg.  The history is provided by the patient. No language interpreter was used.  Back Pain Location:  Lumbar spine Quality:  Aching Radiates to:  Does not radiate Pain severity:  Mild Pain is:  Same all the time Onset quality:  Sudden Timing:  Constant Progression:  Worsening Chronicity:  New Context: emotional stress   Relieved by:  Nothing Associated symptoms: no abdominal pain, no chest pain and no headaches        Past Medical History:  Diagnosis Date  . Family history of heart disease   . Hyperlipidemia   . Hypertension   . Leriche syndrome [facility])     Patient Active Problem List   Diagnosis Date Noted  . Pulmonary emboli ([place]) 07/07/2019  . Acute pulmonary embolism ([place]) 07/07/2019  . Asymptomatic bacteriuria 07/07/2019  . Family history of coronary artery disease 01/17/2019  . Normocytic anemia 11/28/2016  . Palpitations 06/08/2016  . Chest pain with normal coronary angiography 08/09/2014  . Peripheral arterial disease ([place]) 06/25/2013  . Essential hypertension 06/25/2013  . Dyslipidemia 06/25/2013    Past Surgical History:  Procedure Laterality Date  . ABI - BILATERAL  2008   moderate arterial insufficiency at rest; pulsatile flow of bilateral ankle PVRs  . APPENDECTOMY  11/21/2002   acute appendicitis  . LEFT HEART CATHETERIZATION WITH CORONARY ANGIOGRAM N/A 11/10/2011   Procedure: LEFT HEART CATHETERIZATION WITH CORONARY ANGIOGRAM;  Surgeon: Lorretta Harp, MD;  Location[facility] C[facility] LAB;  Service: Cardiovascular;  Laterality: N/A; - normal left main/LAD/L Cfx/RCA  . NM MYOCAR PERF WALL MOTION  10/2011   lexiscan -  EF71%, normal perfusion; low risk scan  . ROTATOR CUFF REPAIR  1995  . TRANSTHORACIC ECHOCARDIOGRAM  10/2011   EF=>55%, calcified moderator band in the RV; borderline LA enlargement; mild MR; mild TR; trace AV regurg & pulm valve regurg     OB History   No obstetric history on file.     Family History  Problem Relation Age of Onset  . Heart disease Father   . Hypertension Father   . Cancer Father   . Heart attack Father        in 86s  . Hypertension Brother        2 with CABG  . Stroke Sister   . Hypertension Sister   . Heart Problems Child        born with enlarged heart    Social History   Tobacco Use  . Smoking status: Former Research scientist (life sciences)  . Smokeless tobacco: Never Used  Substance Use Topics  . Alcohol use: Yes    Comment: occasional  . Drug use: No    Home Medications Prior to Admission medications   Medication Sig Start Date End Date Taking? Authorizing Provider  amLODipine (NORVASC) 2.5 MG tablet Take 1 tablet (2.5 mg total) by mouth daily. 08/11/18  Yes Lorretta Harp, MD  apixaban (ELIQUIS) 5 MG TABS tablet Take 1 tablet (5 mg total) by mouth 2 (two) times daily. 08/08/19  Yes Charlynne Cousins, MD  aspirin EC 81 MG tablet Take 324 mg by mouth daily as needed  for mild pain.    Yes [provider]  clopidogrel (PLAVIX) 75 MG tablet Take 75 mg by mouth at bedtime.    Yes [provider]  Coenzyme Q-10 200 MG CAPS Take 200 mg by mouth daily.    Yes [provider]  DEXILANT 60 MG capsule Take 60 mg by mouth daily. 08/25/15  Yes [provider]  diazepam (VALIUM) 5 MG tablet Take 2.5 mg by mouth daily.  06/01/13  Yes [provider]  furosemide (LASIX) 20 MG tablet Take 20 mg by mouth daily.  01/04/18  Yes [provider]  metoprolol tartrate (LOPRESSOR) 25 MG tablet TAKE 1 TABLET(25 MG) BY MOUTH TWICE DAILY Patient taking differently: Take 25 mg by mouth 2 (two) times daily.  12/20/18  Yes Runell Gess, MD    rosuvastatin (CRESTOR) 40 MG tablet Take 40 mg by mouth daily.   Yes [provider]  Vitamin D, Ergocalciferol, (DRISDOL) 50000 units CAPS capsule Take 50,000 Units by mouth every Wednesday.  01/11/18  Yes [provider]  Apixaban Starter Pack (ELIQUIS STARTER PACK) 5 MG TBPK Take as directed on package: start with two-5mg  tablets twice daily for 7 days. On day 8, switch to one-5mg  tablet twice daily. Patient not taking: Reported on 08/01/2019 07/09/19   Marinda Elk, MD  HYDROcodone-acetaminophen (NORCO/VICODIN) 5-325 MG tablet Take 1 tablet by mouth every 6 (six) hours as needed for moderate pain. 08/01/19   Bethann Berkshire, MD    Allergies    Amoxicillin and Ramipril  Review of Systems   Review of Systems  Constitutional: Negative for appetite change and fatigue.  HENT: Negative for congestion, ear discharge and sinus pressure.   Eyes: Negative for discharge.  Respiratory: Negative for cough.   Cardiovascular: Negative for chest pain.  Gastrointestinal: Negative for abdominal pain and diarrhea.  Genitourinary: Negative for frequency and hematuria.  Musculoskeletal: Positive for back pain.  Skin: Negative for rash.  Neurological: Negative for seizures and headaches.  Psychiatric/Behavioral: Negative for hallucinations.    Physical Exam Updated Vital Signs BP (!) 154/76 (BP Location: Right Arm)   Pulse 65   Temp 97.6 F (36.4 C) (Oral)   Resp 17   SpO2 98%   Physical Exam Vitals and nursing note reviewed.  Constitutional:      Appearance: She is well-developed.  HENT:     Head: Normocephalic.     Nose: Nose normal.     Mouth/Throat:     Mouth: Mucous membranes are moist.  Eyes:     General: No scleral icterus.    Conjunctiva/sclera: Conjunctivae normal.  Neck:     Thyroid: No thyromegaly.  Cardiovascular:     Rate and Rhythm: Normal rate and regular rhythm.     Heart sounds: No murmur. No friction rub. No gallop.   Pulmonary:     Breath  sounds: No stridor. No wheezing or rales.  Chest:     Chest wall: No tenderness.  Abdominal:     General: There is no distension.     Tenderness: There is no abdominal tenderness. There is no rebound.  Musculoskeletal:        General: Normal range of motion.     Cervical back: Neck supple.     Comments: Tender left flank  Lymphadenopathy:     Cervical: No cervical adenopathy.  Skin:    Findings: No erythema or rash.  Neurological:     Mental Status: She is alert and oriented to person, place,  and time.     Motor: No abnormal muscle tone.     Coordination: Coordination normal.  Psychiatric:        Behavior: Behavior normal.     ED Results / Procedures / Treatments   Labs (all labs ordered are listed, but only abnormal results are displayed) Labs Reviewed  COMPREHENSIVE METABOLIC PANEL - Abnormal; Notable for the following components:      Result Value   Glucose, Bld 103 (*)    All other components within normal limits  CBC WITH DIFFERENTIAL/PLATELET  URINALYSIS, ROUTINE W REFLEX MICROSCOPIC    EKG None  Radiology CT ABDOMEN PELVIS W CONTRAST  Result Date: 08/01/2019 CLINICAL DATA:  Acute generalized abdominal pain. Patient reports groin pain and back pain. Recent diagnosis of pulmonary embolus. EXAM: CT ABDOMEN AND PELVIS WITH CONTRAST TECHNIQUE: Multidetector CT imaging of the abdomen and pelvis was performed using the standard protocol following bolus administration of intravenous contrast. CONTRAST:  OMNIPAQUE IOHEXOL 300 MG/ML  SOLN COMPARISON:  Chest CTA 07/07/2019. Remote abdominal CT 08/26/2005 FINDINGS: Lower chest: Near completely resolved pleural effusions from prior exam with trace residual on the right. Improved basilar aeration with residual atelectasis or scarring in the left lower lobe. Hepatobiliary: Scattered cysts as well as low-density lesions that are too small to accurately characterized within the liver at are grossly unchanged from 2007 exam. No  suspicious hepatic lesion. Gallbladder physiologically distended, no calcified stone. No biliary dilatation. Pancreas: Unremarkable. No pancreatic ductal dilatation or surrounding inflammatory changes. Spleen: Normal in size without focal abnormality. Adrenals/Urinary Tract: Normal adrenal glands. No hydronephrosis or perinephric edema. Homogeneous renal enhancement with symmetric excretion on delayed phase imaging. Urinary bladder is physiologically distended without wall thickening. Stomach/Bowel: Stomach is unremarkable. Normal positioning of the ligament of Treitz. No small bowel obstruction or inflammation. Prior appendectomy with surgical clips at the base of the cecum. Moderate stool burden throughout the colon with colonic tortuosity. No colonic wall thickening or inflammatory change. Mild distal diverticulosis without diverticulitis. Vascular/Lymphatic: Aortic atherosclerosis. 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 The portal vein is patent. There are no acute vascular findings. No adenopathy. Reproductive: Heterogeneous uterus with a probable 4.3 cm fibroid involving the right aspect, uterus has slightly decreased in size from prior exam. Ovaries are not well-defined, no evidence of adnexal mass. Other: No free air, free fluid, or intra-abdominal fluid collection. No inguinal or significant body wall hernia. Musculoskeletal: There are no acute or suspicious osseous abnormalities. Mild degenerative disc disease at L4-L5. IMPRESSION: 1. No acute abnormality in the abdomen/pelvis. 2. Moderate stool burden throughout the colon with colonic tortuosity, can be seen with constipation. Mild distal diverticulosis without diverticulitis. 3. 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 4. Uterine fibroid. Aortic Atherosclerosis (ICD10-I70.0). Electronically Signed   By: Narda Rutherford M.D.   On:  08/01/2019 23:01   DG Chest Port 1 View  Result Date: 08/01/2019 CLINICAL DATA:  Left chest and groin pain EXAM: PORTABLE CHEST 1 VIEW COMPARISON:  07/07/2019 FINDINGS: Heart is borderline in size. No confluent opacities, effusions or edema. No acute bony abnormality. IMPRESSION: No active disease. Electronically Signed   By: Charlett Nose M.D.   On: 08/01/2019 21:45    Procedures Procedures (including critical care time)  Medications Ordered in ED Medications  sodium chloride (PF) 0.9 % injection (has no administration in time range)  iohexol (OMNIPAQUE) 300 MG/ML solution 100 mL (100 mLs Intravenous  Contrast Given 08/01/19 2240)    ED Course  I have reviewed the triage vital signs and the nursing notes.  Pertinent labs & imaging results that were available during my care of the patient were reviewed by me and considered in my medical decision making (see chart for details).    MDM Rules/Calculators/A&P                      Patient with lumbar back pain.  CBC chemistries urinalysis along with CT of the abdomen unremarkable.  Patient will be given pain medicine and treated for musculoskeletal pain    This patient presents to the ED for concern of back pain, this involves an extensive number of treatment options, and is a complaint that carries with it a high risk of complications and morbidity.  The differential diagnosis includes kidney stone musculoskeletal pain hematoma   Lab Tests:   I Ordered, reviewed, and interpreted labs, which included CBC and chemistries which were unremarkable  Medicines ordered:     Imaging Studies ordered:   I ordered imaging studies which included chest x-ray and CT abdomen and  I independently visualized and interpreted imaging which showed no significant acute disease  Additional history obtained:   Additional history obtained from records  Previous records obtained and reviewed   Consultations  Obtained:   Reevaluation:   Critical Interventions:  .   Final Clinical Impression(s) / ED Diagnoses Final diagnoses:  Chronic midline low back pain, unspecified whether sciatica present    Rx / DC Orders ED Discharge Orders         Ordered    HYDROcodone-acetaminophen (NORCO/VICODIN) 5-325 MG tablet  Every 6 hours PRN     08/01/19 2319           Bethann Berkshire, MD 08/01/19 2322

## 2019-08-01 NOTE — ED Notes (Signed)
Pt ambulatory from triage to room Pt has urine sample at beside if needed

## 2019-08-01 NOTE — Discharge Instructions (Addendum)
Follow-up with your doctor as planned next week.  Return sooner if any problem

## 2019-08-01 NOTE — Telephone Encounter (Signed)
Patient is requesting to speak with the nurse in regards to testing. She states she has additional questions. Please call.

## 2019-08-01 NOTE — ED Notes (Signed)
Pt ambulated to BR with steady gait and back to room

## 2019-08-01 NOTE — Telephone Encounter (Signed)
New Message  Patient c/o Palpitations:  High priority if patient c/o lightheadedness, shortness of breath, or chest pain  1) How long have you had palpitations/irregular HR/ Afib? Are you having the symptoms now? Around 5 years; Yes  2) Are you currently experiencing lightheadedness, SOB or CP?  Lightheadness  3) Do you have a history of afib (atrial fibrillation) or irregular heart rhythm? Yes  4) Have you checked your BP or HR? (document readings if available): 130/64; 60's  5) Are you experiencing any other symptoms?  pain in left thigh and groin area

## 2019-08-08 ENCOUNTER — Other Ambulatory Visit: Payer: Self-pay

## 2019-08-08 ENCOUNTER — Encounter: Payer: Self-pay | Admitting: Cardiovascular Disease

## 2019-08-08 ENCOUNTER — Ambulatory Visit (INDEPENDENT_AMBULATORY_CARE_PROVIDER_SITE_OTHER): Payer: Medicare Other | Admitting: Cardiovascular Disease

## 2019-08-08 DIAGNOSIS — I10 Essential (primary) hypertension: Secondary | ICD-10-CM

## 2019-08-08 DIAGNOSIS — E785 Hyperlipidemia, unspecified: Secondary | ICD-10-CM | POA: Diagnosis not present

## 2019-08-08 DIAGNOSIS — R079 Chest pain, unspecified: Secondary | ICD-10-CM

## 2019-08-08 DIAGNOSIS — I2693 Single subsegmental pulmonary embolism without acute cor pulmonale: Secondary | ICD-10-CM

## 2019-08-08 DIAGNOSIS — I739 Peripheral vascular disease, unspecified: Secondary | ICD-10-CM

## 2019-08-08 NOTE — Assessment & Plan Note (Signed)
History of recent admission 07/07/2019 with chest pain.  Her D-dimer was mildly elevated and a CT scan showed a pulmonary embolus for which she was placed on Eliquis oral anticoagulation.  This occurred 4 days after her second Pfizer vaccine.

## 2019-08-08 NOTE — Progress Notes (Signed)
08/08/2019 Hannah [name]   1945-09-29  [id number]  Primary Physician Ralene Ok, MD Primary Cardiologist: Runell Gess MD Nicholes Calamity, [name]  HPI:  Hannah [name] is a 74 y.o.  thin-appearing married African American female, mother of 1 child, who I last saw virtually for telemedicine visit 08/11/2018... She has a history of normal coronary arteries by catheterization, which I performed November 10, 2011. 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 She really denies significant lifestyle-limiting claudication. Her other problems include remote tobacco abuse, having quit over 20 years ago, treated hypertension, hyperlipidemia, as well as a strong family history for heart disease with a father who had an MI in his 20s and 2 brothers who have had bypass surgery. Since I saw her a year ago , she did have some cramping in her right calf however venous Dopplers were negative for DVT. 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 She ALSO gets occasional morning palpitations and has worn an event monitor in the past which was unrevealing. Since I saw her a year ago she is remained stable. She complains of occasional intermittent chest pain, shortness of breath and palpitations although her work-up has been negative. Dopplers revealed ABIs in the 0.7 range bilaterally. Since I saw her 6 months ago she continues to complain of lifestyle limiting claudication although she does not wish surgical revascularization. Her ABIs performed yesterday were unchanged. She also complains of shortness of breath and morning palpitations. She drinks 1 cup of coffee a day. Prior work-up for palpitations performed years ago with an event monitor showed only sinus  rhythm.  Lower  extremity arterial Doppler studies performed 06/13/2018 revealed a right ABI 0.69 and a left ABI 0.74.  Recent event monitor performed 08/03/2018 revealed episodes of PSVT and nonsustained ventricular tachycardia.  Her last 2D echo performed 11/29/2016 was essentially normal.   Since I saw her a year ago she has done well until recently when she was admitted on 07/07/2019 to [facility] with chest pain and shortness of breath.  A D-dimer was mildly elevated and it chest CTA was remarkable for pulmonary embolus for which she was placed on Eliquis oral anticoagulation.  This occurred 4 days after her second Pfizer vaccine shot.  Current Meds  Medication Sig  . amLODipine (NORVASC) 2.5 MG tablet Take 1 tablet (2.5 mg total) by mouth daily.  Marland Kitchen apixaban (ELIQUIS) 5 MG TABS tablet Take 1 tablet (5 mg total) by mouth 2 (two) times daily.  Marland Kitchen aspirin EC 81 MG tablet Take 324 mg by mouth daily as needed for mild pain.   Marland Kitchen clopidogrel (PLAVIX) 75 MG tablet Take 75 mg by mouth at bedtime.   . Coenzyme Q-10 200 MG CAPS Take 200 mg by mouth daily.   Marland Kitchen DEXILANT 60 MG capsule Take 60 mg by mouth daily.  . diazepam (VALIUM) 5 MG tablet Take 2.5 mg by mouth daily.   . furosemide (LASIX) 20 MG tablet Take 20 mg by mouth daily.   Marland Kitchen HYDROcodone-acetaminophen (NORCO/VICODIN) 5-325 MG tablet Take 1 tablet by mouth every 6 (six) hours as needed for moderate pain.  . metoprolol tartrate (LOPRESSOR) 25 MG tablet TAKE 1 TABLET(25 MG) BY MOUTH TWICE DAILY (Patient taking differently: Take 25 mg by mouth 2 (two) times daily. )  .  rosuvastatin (CRESTOR) 40 MG tablet Take 40 mg by mouth daily.  . Vitamin D, Ergocalciferol, (DRISDOL) 50000 units CAPS capsule Take 50,000 Units by mouth every Wednesday.      Allergies  Allergen Reactions  . Amoxicillin Swelling  . Ramipril Swelling    Angioedema     Social History   Socioeconomic History  . Marital status: Married    Spouse name: Not on file  .  Number of children: 1  . Years of education: 82  . Highest education level: Not on file  Occupational History  . Not on file  Tobacco Use  . Smoking status: Former Games developer  . Smokeless tobacco: Never Used  Substance and Sexual Activity  . Alcohol use: Yes    Comment: occasional  . Drug use: No  . Sexual activity: Not on file  Other Topics Concern  . Not on file  Social History Narrative  . Not on file   Social Determinants of Health   Financial Resource Strain:   . Difficulty of Paying Living Expenses:   Food Insecurity:   . Worried About Programme researcher, broadcasting/film/video in the Last Year:   . Barista in the Last Year:   Transportation Needs:   . Freight forwarder (Medical):   Marland Kitchen Lack of Transportation (Non-Medical):   Physical Activity:   . Days of Exercise per Week:   . Minutes of Exercise per Session:   Stress:   . Feeling of Stress :   Social Connections:   . Frequency of Communication with Friends and Family:   . Frequency of Social Gatherings with Friends and Family:   . Attends Religious Services:   . Active Member of Clubs or Organizations:   . Attends Banker Meetings:   Marland Kitchen Marital Status:   Intimate Partner Violence:   . Fear of Current or Ex-Partner:   . Emotionally Abused:   Marland Kitchen Physically Abused:   . Sexually Abused:      Review of Systems: General: negative for chills, fever, night sweats or weight changes.  Cardiovascular: negative for chest pain, dyspnea on exertion, edema, orthopnea, palpitations, paroxysmal nocturnal dyspnea or shortness of breath Dermatological: negative for rash Respiratory: negative for cough or wheezing Urologic: negative for hematuria Abdominal: negative for nausea, vomiting, diarrhea, bright red blood per rectum, melena, or hematemesis Neurologic: negative for visual changes, syncope, or dizziness All other systems reviewed and are otherwise negative except as noted above.    Blood pressure (!) 148/62, pulse  60, height 5' 2.5" (1.588 m), weight 141 lb 1.6 oz (64 kg), SpO2 98 %.  General appearance: alert and no distress Neck: no adenopathy, no JVD, supple, symmetrical, trachea midline, thyroid not enlarged, symmetric, no tenderness/mass/nodules and Soft left carotid bruit Lungs: clear to auscultation bilaterally Heart: regular rate and rhythm, S1, S2 normal, no murmur, click, rub or gallop Extremities: extremities normal, atraumatic, no cyanosis or edema Pulses: Absent pedal pulses Skin: Skin color, texture, turgor normal. No rashes or lesions Neurologic: Alert and oriented X 3, normal strength and tone. Normal symmetric reflexes. Normal coordination and gait  EKG not performed today  ASSESSMENT AND PLAN:   Peripheral arterial disease (HCC) History of PAD with known Leriche syndrome.  The patient denies claudication.  Essential hypertension History of essential hypertension a blood pressure measured at 148/62.  She is on amlodipine and metoprolol.  Dyslipidemia History of dyslipidemia on Crestor and Zetia with recent lipid profile performed 07/17/2019 revealing total cholesterol 170, LDL 111  and HDL 44 followed by her PCP  Chest pain with normal coronary angiography History of normal coronary arteries by cath in 2013  Pulmonary emboli [facility]) History of recent admission 07/07/2019 with chest pain.  Her D-dimer was mildly elevated and a CT scan showed a pulmonary embolus for which she was placed on Eliquis oral anticoagulation.  This occurred 4 days after her second [place] vaccine.      Lorretta Harp MD FACP,FACC,FAHA, [facility] 08/08/2019 3:03 PM

## 2019-08-08 NOTE — Assessment & Plan Note (Signed)
History of dyslipidemia on Crestor and Zetia with recent lipid profile performed 07/17/2019 revealing total cholesterol 170, LDL 111 and HDL 44 followed by her PCP

## 2019-08-08 NOTE — Assessment & Plan Note (Signed)
History of normal coronary arteries by cath in 2013

## 2019-08-08 NOTE — Patient Instructions (Signed)
Medication Instructions:  The current medical regimen is effective;  continue present plan and medications.  *If you need a refill on your cardiac medications before your next appointment, please call your pharmacy*   Follow-Up: At [facility], you and your health needs are our priority.  As part of our continuing mission to provide you with exceptional heart care, we have created designated Provider Care Teams.  These Care Teams include your primary Cardiologist (physician) and Advanced Practice Providers (APPs -  Physician Assistants and Nurse Practitioners) who all work together to provide you with the care you need, when you need it.  We recommend signing up for the patient portal called "MyChart".  Sign up information is provided on this After Visit Summary.  MyChart is used to connect with patients for Virtual Visits (Telemedicine).  Patients are able to view lab/test results, encounter notes, upcoming appointments, etc.  Non-urgent messages can be sent to your provider as well.   To learn more about what you can do with MyChart, go to [email or web address].    Your next appointment:   Follow up with PA in 3 months Follow up with Dr.Berry in 6 months

## 2019-08-08 NOTE — Assessment & Plan Note (Signed)
History of essential hypertension a blood pressure measured at 148/62.  She is on amlodipine and metoprolol.

## 2019-08-08 NOTE — Assessment & Plan Note (Signed)
History of PAD with known Leriche syndrome.  The patient denies claudication.

## 2019-08-11 ENCOUNTER — Other Ambulatory Visit: Payer: Self-pay | Admitting: Cardiovascular Disease

## 2019-08-13 NOTE — Telephone Encounter (Signed)
Rx(s) sent to pharmacy electronically.  

## 2019-08-24 ENCOUNTER — Other Ambulatory Visit: Payer: Self-pay | Admitting: Cardiovascular Disease

## 2019-10-11 ENCOUNTER — Telehealth: Payer: Self-pay | Admitting: Cardiovascular Disease

## 2019-10-11 NOTE — Telephone Encounter (Signed)
New Message  Pt c/o swelling: STAT is pt has developed SOB within 24 hours  1) How much weight have you gained and in what time span? No weight gain, same  2) If swelling, where is the swelling located? Left Arm swelling and pain  3) Are you currently taking a fluid pill? Yes  4) Are you currently SOB? Noting out of the norm per patient   5) Do you have a log of your daily weights (if so, list)? No weight gain, same  6) Have you gained 3 pounds in a day or 5 pounds in a week? No   7) Have you traveled recently? No

## 2019-10-11 NOTE — Telephone Encounter (Signed)
Pt calling today with c/o localized swelling, pain, and erythema on her right upper arm. She denies any accompanied CP or SOB at this time. She states the swelling is localized to one area of her upper arm and does not extend down toward her fingers. She states the pain and erythema are localized to the swollen area on her upper arm. She denies any pain and erythema below the swelling. She denies numbness, tingling, or difficulty in movement of her arm, wrist, and fingers. She denies any recent injury, laceration, or insect bite (to her knowledge) on her right upper extremity. She confirms she is taking her eliquis bid and plavix as directed.  I advised her to contact her PCP for further evaluation. Since she is correctly taking her anticoagulants and antiplatelet medications, the risk for developing another clot was low. If her PCP believed this to be vascular in nature, we will work her in to see Dr. Allyson Sabal asap.   She agrees with plan and had no additional questions.

## 2019-10-21 ENCOUNTER — Other Ambulatory Visit: Payer: Self-pay | Admitting: Cardiovascular Disease

## 2019-11-08 ENCOUNTER — Ambulatory Visit (INDEPENDENT_AMBULATORY_CARE_PROVIDER_SITE_OTHER): Payer: Medicare Other | Admitting: Cardiology

## 2019-11-08 ENCOUNTER — Encounter: Payer: Self-pay | Admitting: Cardiology

## 2019-11-08 ENCOUNTER — Other Ambulatory Visit: Payer: Self-pay

## 2019-11-08 DIAGNOSIS — I10 Essential (primary) hypertension: Secondary | ICD-10-CM | POA: Diagnosis not present

## 2019-11-08 DIAGNOSIS — I2693 Single subsegmental pulmonary embolism without acute cor pulmonale: Secondary | ICD-10-CM | POA: Diagnosis not present

## 2019-11-08 DIAGNOSIS — E785 Hyperlipidemia, unspecified: Secondary | ICD-10-CM

## 2019-11-08 DIAGNOSIS — R002 Palpitations: Secondary | ICD-10-CM | POA: Diagnosis not present

## 2019-11-08 DIAGNOSIS — I739 Peripheral vascular disease, unspecified: Secondary | ICD-10-CM | POA: Diagnosis not present

## 2019-11-08 NOTE — Patient Instructions (Signed)
Medication Instructions:  Your physician recommends that you continue on your current medications as directed. Please refer to the Current Medication list given to you today.  *If you need a refill on your cardiac medications before your next appointment, please call your pharmacy*   Follow-Up: At [facility], you and your health needs are our priority.  As part of our continuing mission to provide you with exceptional heart care, we have created designated Provider Care Teams.  These Care Teams include your primary Cardiologist (physician) and Advanced Practice Providers (APPs -  Physician Assistants and Nurse Practitioners) who all work together to provide you with the care you need, when you need it.  We recommend signing up for the patient portal called "MyChart".  Sign up information is provided on this After Visit Summary.  MyChart is used to connect with patients for Virtual Visits (Telemedicine).  Patients are able to view lab/test results, encounter notes, upcoming appointments, etc.  Non-urgent messages can be sent to your provider as well.   To learn more about what you can do with MyChart, go to [email or web address].    Your next appointment:   Mid-September  The format for your next appointment:   In Person  Provider:   Nanetta Batty, MD

## 2019-11-08 NOTE — Assessment & Plan Note (Signed)
Pt admitted with PE March 2021-4 days after Phizer COVID vaccine

## 2019-11-08 NOTE — Assessment & Plan Note (Signed)
Zetia added Feb 2020- PCP follows

## 2019-11-08 NOTE — Progress Notes (Signed)
Cardiology Office Note:    Date:  11/08/2019   ID:  [name], [name] 1945/05/22, MRN [id number]  PCP:  Ralene Ok, MD  Cardiologist:  Nanetta Batty, MD  Electrophysiologist:  None   Referring MD: Ralene Ok, MD   No chief complaint on file.   History of Present Illness:    Hannah [name] is a 74 y.o. female with a hx of normal coronaries and Leriche syndrome by angiogram in 2013. ABI's were moderately depressed by doppler March 2021. She has some claudication but at this time does not want further evaluation.  She has had a long history of palpitations and multiple monitors have been placed.  She did have rare PSVT in April 2020.  In March 2021 she had a pulmonary embolism 4 days after she received her COVID vaccine.  She has been on Eliquis since,  Duration of therapy has been deferred to Dr Allyson Sabal.   She is in the office today for routine follow up. She has multiple unusual complaints, rash under her arms (none seen), occasional "lumps" in her forarms (none today), and recurrent AM tachycardia. She is on Metoprolol BID but sometimes takes only 1/2 dose in the evening because of slow HR "54".    Past Medical History:  Diagnosis Date  . Acute pulmonary embolism (HCC) 07/07/2019  . Chest pain with normal coronary angiography 08/09/2014   Cath 2013  . Dyslipidemia 06/25/2013   Zetia added Feb 2020- PCP follows  . Essential hypertension 06/25/2013   Echo June 2020- EF 50-55%, diastolic dysfunction  . Family history of coronary artery disease 01/17/2019   Father and two brothers with early CAD  . Family history of heart disease   . Hyperlipidemia   . Hypertension   . Leriche syndrome (HCC)   . Normocytic anemia 11/28/2016  . Palpitations 06/08/2016   Rare PSVT and NSVT on monitor April 2020- medications adjusted  . Peripheral arterial disease (HCC) 06/25/2013   Leriche syndrome by PVA 2013. Some claudication but she declines further work up. ABis moderately decreased March  2020  . Pulmonary emboli (HCC) 07/07/2019   Acute pulmonary lesion    Past Surgical History:  Procedure Laterality Date  . ABI - BILATERAL  2008   moderate arterial insufficiency at rest; pulsatile flow of bilateral ankle PVRs  . APPENDECTOMY  11/21/2002   acute appendicitis  . LEFT HEART CATHETERIZATION WITH CORONARY ANGIOGRAM N/A 11/10/2011   Procedure: LEFT HEART CATHETERIZATION WITH CORONARY ANGIOGRAM;  Surgeon: Runell Gess, MD;  Location: [facility] CATH LAB;  Service: Cardiovascular;  Laterality: N/A; - normal left main/LAD/L Cfx/RCA  . NM MYOCAR PERF WALL MOTION  10/2011   lexiscan - EF71%, normal perfusion; low risk scan  . ROTATOR CUFF REPAIR  1995  . TRANSTHORACIC ECHOCARDIOGRAM  10/2011   EF=>55%, calcified moderator band in the RV; borderline LA enlargement; mild MR; mild TR; trace AV regurg & pulm valve regurg    Current Medications: Current Meds  Medication Sig  . amLODipine (NORVASC) 2.5 MG tablet TAKE 1 TABLET(2.5 MG) BY MOUTH DAILY  . aspirin EC 81 MG tablet Take 324 mg by mouth daily as needed for mild pain.   Marland Kitchen clopidogrel (PLAVIX) 75 MG tablet Take 75 mg by mouth at bedtime.   . Coenzyme Q-10 200 MG CAPS Take 200 mg by mouth daily.   Marland Kitchen DEXILANT 60 MG capsule Take 60 mg by mouth daily.  . diazepam (VALIUM) 5 MG tablet Take 2.5 mg by mouth daily.   Marland Kitchen  ELIQUIS 5 MG TABS tablet TAKE 1 TABLET BY MOUTH TWICE DAILY  . furosemide (LASIX) 20 MG tablet Take 20 mg by mouth daily.   . metoprolol tartrate (LOPRESSOR) 25 MG tablet TAKE 2 TABLETS BY MOUTH DAILY IN THE MORNING AND 1 TABLET IN THE EVENING  . rosuvastatin (CRESTOR) 40 MG tablet Take 40 mg by mouth daily.  . Vitamin D, Ergocalciferol, (DRISDOL) 50000 units CAPS capsule Take 50,000 Units by mouth every Wednesday.   . [DISCONTINUED] HYDROcodone-acetaminophen (NORCO/VICODIN) 5-325 MG tablet Take 1 tablet by mouth every 6 (six) hours as needed for moderate pain.     Allergies:   Amoxicillin and Ramipril   Social History    Socioeconomic History  . Marital status: Married    Spouse name: Not on file  . Number of children: 1  . Years of education: 69  . Highest education level: Not on file  Occupational History  . Not on file  Tobacco Use  . Smoking status: Former Games developer  . Smokeless tobacco: Never Used  Substance and Sexual Activity  . Alcohol use: Yes    Comment: occasional  . Drug use: No  . Sexual activity: Not on file  Other Topics Concern  . Not on file  Social History Narrative  . Not on file   Social Determinants of Health   Financial Resource Strain:   . Difficulty of Paying Living Expenses:   Food Insecurity:   . Worried About Programme researcher, broadcasting/film/video in the Last Year:   . Barista in the Last Year:   Transportation Needs:   . Freight forwarder (Medical):   Marland Kitchen Lack of Transportation (Non-Medical):   Physical Activity:   . Days of Exercise per Week:   . Minutes of Exercise per Session:   Stress:   . Feeling of Stress :   Social Connections:   . Frequency of Communication with Friends and Family:   . Frequency of Social Gatherings with Friends and Family:   . Attends Religious Services:   . Active Member of Clubs or Organizations:   . Attends Banker Meetings:   Marland Kitchen Marital Status:      Family History: The patient's family history includes Cancer in her father; Heart Problems in her child; Heart attack in her father; Heart disease in her father; Hypertension in her brother, father, and sister; Stroke in her sister.  ROS:   Please see the history of present illness.     All other systems reviewed and are negative.  EKGs/Labs/Other Studies Reviewed:    The following studies were reviewed today:  Echo June 2020- IMPRESSIONS    1. The left ventricle has low normal systolic function, with an ejection  fraction of 50-55%. The cavity size was normal. Left ventricular diastolic  Doppler parameters are consistent with impaired relaxation. Left   ventricular diffuse hypokinesis.  2. The right ventricle has normal systolic function. The cavity was  normal. There is no increase in right ventricular wall thickness.  3. The mitral valve is degenerative. Mild thickening of the mitral valve  leaflet.   EKG:  EKG is not ordered today.  The ekg ordered 07/07/2019 demonstrates NSR- ST at 100.  Recent Labs: 07/07/2019: B Natriuretic Peptide 25.7 08/01/2019: ALT 13; BUN 17; Creatinine, Ser 0.93; Hemoglobin 12.3; Platelets 367; Potassium 4.1; Sodium 139  Recent Lipid Panel No results found for: CHOL, TRIG, HDL, CHOLHDL, VLDL, LDLCALC, LDLDIRECT  Physical Exam:    VS:  BP (!) 130/73  Pulse 77   Temp (!) 97.3 F (36.3 C)   Ht 5' 2.5" (1.588 m)   Wt 140 lb 9.6 oz (63.8 kg)   SpO2 98%   BMI 25.31 kg/m     Wt Readings from Last 3 Encounters:  11/08/19 140 lb 9.6 oz (63.8 kg)  08/08/19 141 lb 1.6 oz (64 kg)  07/07/19 139 lb (63 kg)     GEN: Well nourished, well developed in no acute distress HEENT: Normal NECK: No JVD; No carotid bruits CARDIAC: RRR, no murmurs, rubs, gallops RESPIRATORY:  Clear to auscultation without rales, wheezing or rhonchi  ABDOMEN: Soft, non-tender, non-distended MUSCULOSKELETAL:  No edema; No deformity  SKIN: Warm and dry NEUROLOGIC:  Alert and oriented x 3 PSYCHIATRIC:  Normal affect   ASSESSMENT:    Pulmonary emboli (HCC) Pt admitted with PE March 2021-4 days after [employer] COVID vaccine  Peripheral arterial disease (HCC) Leriche syndrome by PVA 2013. Some claudication but she declines further work up. ABis moderately decreased March 2020-unchanged March 2021  Essential hypertension Echo June 2020- EF 50-55%, diastolic dysfunction B/P controlled  Palpitations Rare PSVT and NSVT on monitor April 2020-  Dyslipidemia Zetia added Feb 2020- PCP follows  PLAN:    I offered to have another monitor placed but she declined.  She says her AM tachycardia is usually short lived. I'll ask Dr Allyson Sabal  about duration of anticoagulation.  One could argue this was a provoked event secondary to her COVID vaccine and that anticoagulation could be stopped after 6 months on Rx.  The patient is actually not anxious to stop anticoagulation.  She has a f/u with Dr Allyson Sabal in Sept.    Medication Adjustments/Labs and Tests Ordered: Current medicines are reviewed at length with the patient today.  Concerns regarding medicines are outlined above.  No orders of the defined types were placed in this encounter.  No orders of the defined types were placed in this encounter.   Patient Instructions  Medication Instructions:  Your physician recommends that you continue on your current medications as directed. Please refer to the Current Medication list given to you today.  *If you need a refill on your cardiac medications before your next appointment, please call your pharmacy*   Follow-Up: At [facility], you and your health needs are our priority.  As part of our continuing mission to provide you with exceptional heart care, we have created designated Provider Care Teams.  These Care Teams include your primary Cardiologist (physician) and Advanced Practice Providers (APPs -  Physician Assistants and Nurse Practitioners) who all work together to provide you with the care you need, when you need it.  We recommend signing up for the patient portal called "MyChart".  Sign up information is provided on this After Visit Summary.  MyChart is used to connect with patients for Virtual Visits (Telemedicine).  Patients are able to view lab/test results, encounter notes, upcoming appointments, etc.  Non-urgent messages can be sent to your provider as well.   To learn more about what you can do with MyChart, go to [email or web address].    Your next appointment:   Mid-September  The format for your next appointment:   In Person  Provider:   Nanetta Batty, MD      Signed, Corine Shelter, PA-C  11/08/2019 2:15  PM    [facility]

## 2019-11-08 NOTE — Assessment & Plan Note (Signed)
Echo June 2020- EF 50-55%, diastolic dysfunction B/P controlled

## 2019-11-08 NOTE — Assessment & Plan Note (Signed)
Rare PSVT and NSVT on monitor April 2020-

## 2019-11-08 NOTE — Assessment & Plan Note (Addendum)
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 Some claudication but she declines further work up. ABis moderately decreased March 2020-unchanged March 2021

## 2019-11-16 ENCOUNTER — Telehealth: Payer: Self-pay | Admitting: *Deleted

## 2019-11-16 NOTE — Telephone Encounter (Signed)
-----   Message from Abelino Derrick, [place] sent at 11/12/2019  7:48 AM EDT ----- April Holding please let Ms Ouk know Dr Allyson Sabal wants her to continue anticoagulation.  Keep follow up in Sept.  Franky Macho [facility] PA-C 11/12/2019 7:49 AM

## 2019-11-16 NOTE — Telephone Encounter (Signed)
Spoke with pt and advised her to continue taking her Eliquis until her appointment on September 15th with Dr Allyson Sabal

## 2019-12-26 ENCOUNTER — Encounter: Payer: Self-pay | Admitting: Cardiovascular Disease

## 2019-12-26 ENCOUNTER — Other Ambulatory Visit: Payer: Self-pay

## 2019-12-26 ENCOUNTER — Ambulatory Visit (INDEPENDENT_AMBULATORY_CARE_PROVIDER_SITE_OTHER): Payer: Medicare Other | Admitting: Cardiovascular Disease

## 2019-12-26 VITALS — BP 124/68 | HR 73 | Ht 62.5 in | Wt 139.0 lb

## 2019-12-26 DIAGNOSIS — I10 Essential (primary) hypertension: Secondary | ICD-10-CM

## 2019-12-26 DIAGNOSIS — I2694 Multiple subsegmental pulmonary emboli without acute cor pulmonale: Secondary | ICD-10-CM

## 2019-12-26 DIAGNOSIS — E785 Hyperlipidemia, unspecified: Secondary | ICD-10-CM | POA: Diagnosis not present

## 2019-12-26 DIAGNOSIS — R002 Palpitations: Secondary | ICD-10-CM

## 2019-12-26 DIAGNOSIS — I739 Peripheral vascular disease, unspecified: Secondary | ICD-10-CM

## 2019-12-26 NOTE — Assessment & Plan Note (Signed)
History of palpitations with event monitoring showing episodes of PSVT and nonsustained ventricular tachycardia.  She is on beta-blockers which somewhat improve the frequency and/or severity of her symptoms.

## 2019-12-26 NOTE — Assessment & Plan Note (Signed)
History of PAD with known Leriche syndrome by angiography.  The patient is minimally symptomatic of does not wish surgical revascularization at this time.

## 2019-12-26 NOTE — Assessment & Plan Note (Signed)
History of dyslipidemia on Crestor followed by her PCP 

## 2019-12-26 NOTE — Progress Notes (Signed)
12/26/2019 Hannah [name]   [birth year]  [id number]  Primary Physician Ralene Ok, MD Primary Cardiologist: Runell Gess MD Nicholes Calamity, [name]  HPI:  Hannah [name] is a 74 y.o.    thin-appearing married African American female, mother of 1 child, who I last saw virtually for telemedicine visit 08/08/2019... She has a history of normal coronary arteries by catheterization, which I performed November 10, 2011. 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 She really denies significant lifestyle-limiting claudication. Her other problems include remote tobacco abuse, having quit over 20 years ago, treated hypertension, hyperlipidemia, as well as a strong family history for heart disease with a father who had an MI in his 39s and 2 brothers who have had bypass surgery. Since I saw her a year ago , she did have some cramping in her right calf however venous Dopplers were negative for DVT. 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 She ALSO gets occasional morning palpitations and has worn an event monitor in the past which was unrevealing. Since I saw her a year ago she is remained stable. She complains of occasional intermittent chest pain, shortness of breath and palpitations although her work-up has been negative. Dopplers revealed ABIs in the 0.7 range bilaterally. Since I saw her 6 months ago she continues to complain of lifestyle limiting claudication although she does not wish surgical revascularization. Her ABIs performed yesterday were unchanged. She also complains of shortness of breath and morning palpitations. She drinks 1 cup of coffee a day. Prior work-up for palpitations performed years ago with an event monitor showed only sinus  rhythm.  Lower  extremity arterial Doppler studies performed 06/13/2018 revealed a right ABI 0.69 and a left ABI 0.74. Recent event monitor performed 08/03/2018 revealed episodes of PSVT and nonsustained ventricular tachycardia. Her last 2D echo performed 11/29/2016 was essentially normal.    She was admitted on 07/07/2019 to [facility] with chest pain and shortness of breath.  A D-dimer was mildly elevated and it chest CTA was remarkable for pulmonary embolus for which she was placed on Eliquis oral anticoagulation.  This occurred 4 days after her second Pfizer vaccine shot.  Since I saw her 6 months ago she is remained stable.  She still occasionally gets palpitations on beta-blocker.  She has some claudication but it is not lifestyle limiting.  She remains on Eliquis oral anticoagulation.   Current Meds  Medication Sig   amLODipine (NORVASC) 2.5 MG tablet TAKE 1 TABLET(2.5 MG) BY MOUTH DAILY   Coenzyme Q-10 200 MG CAPS Take 200 mg by mouth daily.    DEXILANT 60 MG capsule Take 60 mg by mouth daily.   diazepam (VALIUM) 5 MG tablet Take 2.5 mg by mouth daily.    ELIQUIS 5 MG TABS tablet TAKE 1 TABLET BY MOUTH TWICE DAILY   furosemide (LASIX) 20 MG tablet Take 20 mg by mouth daily.    metoprolol tartrate (LOPRESSOR) 25 MG tablet TAKE 2 TABLETS BY MOUTH DAILY IN THE MORNING AND 1 TABLET IN THE EVENING   rosuvastatin (CRESTOR) 40 MG tablet Take 40 mg by mouth daily.   tobramycin-dexamethasone (TOBRADEX) ophthalmic ointment 1 application 3 (three) times daily.   Vitamin D, Ergocalciferol, (DRISDOL) 50000 units CAPS capsule Take 50,000 Units by mouth every Wednesday.    [  DISCONTINUED] aspirin EC 81 MG tablet Take 324 mg by mouth daily as needed for mild pain.    [DISCONTINUED] clopidogrel (PLAVIX) 75 MG tablet Take 75 mg by mouth at bedtime.      Allergies  Allergen Reactions   Amoxicillin Swelling   Ramipril Swelling    Angioedema     Social History    Socioeconomic History   Marital status: Married    Spouse name: Not on file   Number of children: 1   Years of education: 12   Highest education level: Not on file  Occupational History   Not on file  Tobacco Use   Smoking status: Former Smoker   Smokeless tobacco: Never Used  Substance and Sexual Activity   Alcohol use: Yes    Comment: occasional   Drug use: No   Sexual activity: Not on file  Other Topics Concern   Not on file  Social History Narrative   Not on file   Social Determinants of Health   Financial Resource Strain:    Difficulty of Paying Living Expenses: Not on file  Food Insecurity:    Worried About Programme researcher, broadcasting/film/video in the Last Year: Not on file   [employer] of Food in the Last Year: Not on file  Transportation Needs:    Lack of Transportation (Medical): Not on file   Lack of Transportation (Non-Medical): Not on file  Physical Activity:    Days of Exercise per Week: Not on file   Minutes of Exercise per Session: Not on file  Stress:    Feeling of Stress : Not on file  Social Connections:    Frequency of Communication with Friends and Family: Not on file   Frequency of Social Gatherings with Friends and Family: Not on file   Attends Religious Services: Not on file   Active Member of Clubs or Organizations: Not on file   Attends Banker Meetings: Not on file   Marital Status: Not on file  Intimate Partner Violence:    Fear of Current or Ex-Partner: Not on file   Emotionally Abused: Not on file   Physically Abused: Not on file   Sexually Abused: Not on file     Review of Systems: General: negative for chills, fever, night sweats or weight changes.  Cardiovascular: negative for chest pain, dyspnea on exertion, edema, orthopnea, palpitations, paroxysmal nocturnal dyspnea or shortness of breath Dermatological: negative for rash Respiratory: negative for cough or wheezing Urologic: negative for  hematuria Abdominal: negative for nausea, vomiting, diarrhea, bright red blood per rectum, melena, or hematemesis Neurologic: negative for visual changes, syncope, or dizziness All other systems reviewed and are otherwise negative except as noted above.    Blood pressure 124/68, pulse 73, height 5' 2.5" (1.588 m), weight 139 lb (63 kg), SpO2 98 %.  General appearance: alert and no distress Neck: no adenopathy, no carotid bruit, no JVD, supple, symmetrical, trachea midline and thyroid not enlarged, symmetric, no tenderness/mass/nodules Lungs: clear to auscultation bilaterally Heart: regular rate and rhythm, S1, S2 normal, no murmur, click, rub or gallop Extremities: extremities normal, atraumatic, no cyanosis or edema Pulses: Diminished pedal pulses Skin: Skin color, texture, turgor normal. No rashes or lesions Neurologic: Alert and oriented X 3, normal strength and tone. Normal symmetric reflexes. Normal coordination and gait  EKG sinus rhythm at 73 with nonspecific ST and T wave changes.  I personally reviewed this EKG.  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  The patient is minimally symptomatic of does not wish surgical revascularization at this time.  Essential hypertension History of essential hypertension a blood pressure measured today at 124/68.  She is on amlodipine.  Dyslipidemia History of dyslipidemia on Crestor followed by her PCP.  Palpitations History of palpitations with event monitoring showing episodes of PSVT and nonsustained ventricular tachycardia.  She is on beta-blockers which somewhat improve the frequency and/or severity of her symptoms.  Pulmonary emboli (HCC) History of pulmonary emboli 4 days after her Pfizer Covid vaccine on Eliquis oral anticoagulation since that time.  We had discussion about whether to continue her oral anticoagulant which I endorsed.      Runell Gess MD FACP,FACC,FAHA, [facility] 12/26/2019 2:38 PM

## 2019-12-26 NOTE — Assessment & Plan Note (Signed)
History of essential hypertension a blood pressure measured today at 124/68.  She is on amlodipine.

## 2019-12-26 NOTE — Patient Instructions (Signed)
Medication Instructions:  STOP CLOPIDOGREL- (PLAVIX)  *If you need a refill on your cardiac medications before your next appointment, please call your pharmacy*  Lab Work: None Ordered At This Time.  If you have labs (blood work) drawn today and your tests are completely normal, you will receive your results only by: Marland Kitchen MyChart Message (if you have MyChart) OR . A paper copy in the mail If you have any lab test that is abnormal or we need to change your treatment, we will call you to review the results.  Testing/Procedures: None Ordered At This Time.   Follow-Up: At [facility], you and your health needs are our priority.  As part of our continuing mission to provide you with exceptional heart care, we have created designated Provider Care Teams.  These Care Teams include your primary Cardiologist (physician) and Advanced Practice Providers (APPs -  Physician Assistants and Nurse Practitioners) who all work together to provide you with the care you need, when you need it.  Your next appointment:   6 month(s) with LUKE 12 Months- BERRY  The format for your next appointment:   In Person  Provider:   You may see Nanetta Batty, MD or one of the following Advanced Practice Providers on your designated Care Team:    Corine Shelter, PA-C  [place], [place]  Edd Fabian, [place]

## 2019-12-26 NOTE — Assessment & Plan Note (Signed)
History of pulmonary emboli 4 days after her Pfizer Covid vaccine on Eliquis oral anticoagulation since that time.  We had discussion about whether to continue her oral anticoagulant which I endorsed.

## 2020-01-30 ENCOUNTER — Other Ambulatory Visit: Payer: Self-pay | Admitting: Cardiovascular Disease

## 2020-02-16 ENCOUNTER — Other Ambulatory Visit: Payer: Self-pay | Admitting: Cardiovascular Disease

## 2020-06-18 ENCOUNTER — Ambulatory Visit (HOSPITAL_COMMUNITY)
Admission: RE | Admit: 2020-06-18 | Discharge: 2020-06-18 | Disposition: A | Discharge status: Home / Self Care | Payer: Medicare Other | Source: Ambulatory Visit | Attending: Cardiology | Admitting: Cardiology

## 2020-06-18 ENCOUNTER — Other Ambulatory Visit: Payer: Self-pay

## 2020-06-18 DIAGNOSIS — I739 Peripheral vascular disease, unspecified: Secondary | ICD-10-CM | POA: Diagnosis not present

## 2020-08-11 ENCOUNTER — Other Ambulatory Visit: Payer: Self-pay

## 2020-08-11 ENCOUNTER — Encounter (HOSPITAL_BASED_OUTPATIENT_CLINIC_OR_DEPARTMENT_OTHER): Payer: Self-pay | Admitting: *Deleted

## 2020-08-11 ENCOUNTER — Other Ambulatory Visit: Payer: Self-pay | Admitting: Cardiovascular Disease

## 2020-08-11 ENCOUNTER — Emergency Department (HOSPITAL_BASED_OUTPATIENT_CLINIC_OR_DEPARTMENT_OTHER): Payer: Medicare Other

## 2020-08-11 ENCOUNTER — Emergency Department (HOSPITAL_BASED_OUTPATIENT_CLINIC_OR_DEPARTMENT_OTHER)
Admission: EM | Admit: 2020-08-11 | Discharge: 2020-08-11 | Disposition: A | Discharge status: Home / Self Care | Payer: Medicare Other | Attending: Emergency Medicine | Admitting: Emergency Medicine

## 2020-08-11 DIAGNOSIS — Z87891 Personal history of nicotine dependence: Secondary | ICD-10-CM | POA: Diagnosis not present

## 2020-08-11 DIAGNOSIS — M25561 Pain in right knee: Secondary | ICD-10-CM | POA: Diagnosis present

## 2020-08-11 DIAGNOSIS — Z7901 Long term (current) use of anticoagulants: Secondary | ICD-10-CM | POA: Diagnosis not present

## 2020-08-11 DIAGNOSIS — I10 Essential (primary) hypertension: Secondary | ICD-10-CM | POA: Insufficient documentation

## 2020-08-11 DIAGNOSIS — Z79899 Other long term (current) drug therapy: Secondary | ICD-10-CM | POA: Insufficient documentation

## 2020-08-11 DIAGNOSIS — M1711 Unilateral primary osteoarthritis, right knee: Secondary | ICD-10-CM | POA: Insufficient documentation

## 2020-08-11 DIAGNOSIS — R600 Localized edema: Secondary | ICD-10-CM | POA: Diagnosis not present

## 2020-08-11 NOTE — ED Provider Notes (Signed)
[facility] EMERGENCY DEPARTMENT Provider Note   CSN: [id number] Arrival date & time: 08/11/20  1855     History Chief Complaint  Patient presents with  . Knee Pain    Hannah [name] is a 75 y.o. female.  The history is provided by the patient.  Knee Pain Location:  Knee Knee location:  R knee Pain details:    Quality:  Aching   Radiates to:  Does not radiate   Severity:  Mild   Onset quality:  Gradual   Timing:  Constant   Progression:  Unchanged Relieved by:  Nothing Worsened by:  Bearing weight Associated symptoms: swelling   Associated symptoms: no back pain, no decreased ROM, no fatigue and no fever        Past Medical History:  Diagnosis Date  . Acute pulmonary embolism (HCC) 07/07/2019  . Chest pain with normal coronary angiography 08/09/2014   Cath 2013  . Dyslipidemia 06/25/2013   Zetia added Feb 2020- PCP follows  . Essential hypertension 06/25/2013   Echo June 2020- EF 50-55%, diastolic dysfunction  . Family history of coronary artery disease 01/17/2019   Father and two brothers with early CAD  . Family history of heart disease   . Hyperlipidemia   . Hypertension   . Leriche syndrome (HCC)   . Normocytic anemia 11/28/2016  . Palpitations 06/08/2016   Rare PSVT and NSVT on monitor April 2020- medications adjusted  . Peripheral arterial disease (HCC) 06/25/2013   Leriche syndrome by PVA 2013. Some claudication but she declines further work up. ABis moderately decreased March 2020  . Pulmonary emboli (HCC) 07/07/2019   Acute pulmonary lesion    Patient Active Problem List   Diagnosis Date Noted  . Pulmonary emboli (HCC) 07/07/2019  . Acute pulmonary embolism (HCC) 07/07/2019  . Asymptomatic bacteriuria 07/07/2019  . Normocytic anemia 11/28/2016  . Palpitations 06/08/2016  . Chest pain with normal coronary angiography 08/09/2014  . Peripheral arterial disease (HCC) 06/25/2013  . Essential hypertension 06/25/2013  . Dyslipidemia  06/25/2013    Past Surgical History:  Procedure Laterality Date  . ABI - BILATERAL  2008   moderate arterial insufficiency at rest; pulsatile flow of bilateral ankle PVRs  . APPENDECTOMY  11/21/2002   acute appendicitis  . LEFT HEART CATHETERIZATION WITH CORONARY ANGIOGRAM N/A 11/10/2011   Procedure: LEFT HEART CATHETERIZATION WITH CORONARY ANGIOGRAM;  Surgeon: Runell Gess, MD;  Location: [facility] CATH LAB;  Service: Cardiovascular;  Laterality: N/A; - normal left main/LAD/L Cfx/RCA  . NM MYOCAR PERF WALL MOTION  10/2011   lexiscan - EF71%, normal perfusion; low risk scan  . ROTATOR CUFF REPAIR  1995  . TRANSTHORACIC ECHOCARDIOGRAM  10/2011   EF=>55%, calcified moderator band in the RV; borderline LA enlargement; mild MR; mild TR; trace AV regurg & pulm valve regurg     OB History   No obstetric history on file.     Family History  Problem Relation Age of Onset  . Heart disease Father   . Hypertension Father   . Cancer Father   . Heart attack Father        in 30s  . Hypertension Brother        2 with CABG  . Stroke Sister   . Hypertension Sister   . Heart Problems Child        born with enlarged heart    Social History   Tobacco Use  . Smoking status: Former Games developer  . Smokeless tobacco: Never  Used  Substance Use Topics  . Alcohol use: Yes    Comment: occasional  . Drug use: No    Home Medications Prior to Admission medications   Medication Sig Start Date End Date Taking? Authorizing Provider  amLODipine (NORVASC) 2.5 MG tablet TAKE 1 TABLET(2.5 MG) BY MOUTH DAILY 08/11/20  Yes Runell Gess, MD  DEXILANT 60 MG capsule Take 60 mg by mouth daily. 08/25/15  Yes [provider]  diazepam (VALIUM) 5 MG tablet Take 2.5 mg by mouth daily.  06/01/13  Yes [provider]  ELIQUIS 5 MG TABS tablet TAKE 1 TABLET BY MOUTH TWICE DAILY 01/30/20  Yes Runell Gess, MD  metoprolol tartrate (LOPRESSOR) 25 MG tablet TAKE 1 TABLET(25 MG) BY MOUTH TWICE DAILY  02/18/20  Yes Runell Gess, MD  rosuvastatin (CRESTOR) 40 MG tablet Take 40 mg by mouth daily.   Yes [provider]  Vitamin D, Ergocalciferol, (DRISDOL) 50000 units CAPS capsule Take 50,000 Units by mouth every Wednesday.  01/11/18  Yes [provider]  Coenzyme Q-10 200 MG CAPS Take 200 mg by mouth daily.     [provider]  furosemide (LASIX) 20 MG tablet Take 20 mg by mouth daily.  01/04/18   [provider]  tobramycin-dexamethasone Wallene Dales) ophthalmic ointment 1 application 3 (three) times daily.    [provider]    Allergies    Amoxicillin and Ramipril  Review of Systems   Review of Systems  Constitutional: Negative for chills, fatigue and fever.  HENT: Negative for ear pain and sore throat.   Eyes: Negative for pain and visual disturbance.  Respiratory: Negative for cough and shortness of breath.   Cardiovascular: Negative for chest pain and palpitations.  Gastrointestinal: Negative for abdominal pain and vomiting.  Genitourinary: Negative for dysuria and hematuria.  Musculoskeletal: Positive for arthralgias and joint swelling. Negative for back pain.  Skin: Negative for color change, pallor, rash and wound.  Neurological: Negative for seizures, syncope, weakness and numbness.  All other systems reviewed and are negative.   Physical Exam Updated Vital Signs  ED Triage Vitals  Enc Vitals Group     BP 08/11/20 1910 (!) 147/85     Pulse Rate 08/11/20 1910 89     Resp 08/11/20 1910 18     Temp 08/11/20 1910 98.1 F (36.7 C)     Temp Source 08/11/20 1910 Oral     SpO2 08/11/20 1910 100 %     Weight 08/11/20 1905 138 lb 14.2 oz (63 kg)     Height 08/11/20 1905 5' 2.5" (1.588 m)     Head Circumference --      Peak Flow --      Pain Score 08/11/20 1905 5     Pain Loc --      Pain Edu? --      Excl. in GC? --     Physical Exam Vitals and nursing note reviewed.  Constitutional:      General: She is not in acute  distress.    Appearance: She is well-developed.  HENT:     Head: Normocephalic and atraumatic.  Cardiovascular:     Rate and Rhythm: Regular rhythm.     Pulses: Normal pulses.  Musculoskeletal:        General: Swelling and tenderness present. Normal range of motion.     Comments: Tenderness to the popliteal area of the left knee and anterior portion of the left knee with some mild swelling  Skin:    General: Skin is warm and dry.  Neurological:     General: No focal deficit present.     Mental Status: She is alert.     Sensory: No sensory deficit.     Motor: No weakness.     ED Results / Procedures / Treatments   Labs (all labs ordered are listed, but only abnormal results are displayed) Labs Reviewed - No data to display  EKG None  Radiology US Venous Img Lower Right (DVT Study)  Result Date: 08/11/2020 CLINICAL DATA:  Leg pain and edema EXAM: Right LOWER EXTREMITY VENOUS DOPPLER ULTRASOUND TECHNIQUE: Gray-scale sonography with compression, as well as color and duplex ultrasound, were performed to evaluate the deep venous system(s) from the level of the common femoral vein through the popliteal and proximal calf veins. COMPARISON:  None. FINDINGS: VENOUS Normal compressibility of the common femoral, superficial femoral, and popliteal veins, as well as the visualized calf veins. Visualized portions of profunda femoral vein and great saphenous vein unremarkable. No filling defects to suggest DVT on grayscale or color Doppler imaging. Doppler waveforms show normal direction of venous flow, normal respiratory plasticity and response to augmentation. Limited views of the contralateral common femoral vein are unremarkable. OTHER None. Limitations: none IMPRESSION: Negative. Electronically Signed   By: Jasmine Pang M.D.   On: 08/11/2020 21:24   DG Knee Complete 4 Views Right  Result Date: 08/11/2020 CLINICAL DATA:  Right knee pain and swelling EXAM: RIGHT KNEE - COMPLETE 4+ VIEW  COMPARISON:  None. FINDINGS: Frontal, bilateral oblique, and lateral views of the right knee are obtained. No fracture, subluxation, or dislocation. Mild patellar spurring. No joint effusion. IMPRESSION: 1. Mild patellofemoral compartmental osteoarthritis. 2. No acute bony abnormality. Electronically Signed   By: Sharlet Salina M.D.   On: 08/11/2020 21:09    Procedures Procedures   Medications Ordered in ED Medications - No data to display  ED Course  I have reviewed the triage vital signs and the nursing notes.  Pertinent labs & imaging results that were available during my care of the patient were reviewed by me and considered in my medical decision making (see chart for details).    MDM Rules/Calculators/A&P                          Camie Patience is here with right knee pain.  X-ray showed arthritis but no fracture or large effusion.  DVT study negative for DVT and Baker's cyst.  Neurovascularly neuromuscularly intact on exam.  Overall suspect some arthritic pain.  Recommend ice and Tylenol and follow-up with primary care doctor.  Discharged in good condition.  This chart was dictated using voice recognition software.  Despite best efforts to proofread,  errors can occur which can change the documentation meaning.     Final Clinical Impression(s) / ED Diagnoses Final diagnoses:  Acute pain of right knee    Rx / DC Orders ED Discharge Orders    None       Virgina Norfolk, DO 08/11/20 2218

## 2020-08-11 NOTE — Discharge Instructions (Addendum)
Recommend 1000 mg of Tylenol every 6 hours as needed for pain.  Recommend ice to the right knee is tolerated.  Follow-up with your primary care doctor.

## 2020-08-11 NOTE — ED Triage Notes (Signed)
Right knee pain. Swelling behind her knee making it difficult to walk.

## 2020-09-11 IMAGING — DX DG CHEST 1V PORT
1 series · 1 of 1 positions shown · non-contrast
Comparison: 07/07/2019

CLINICAL DATA: Left chest and groin pain

EXAM:
PORTABLE CHEST 1 VIEW

[chest ap]
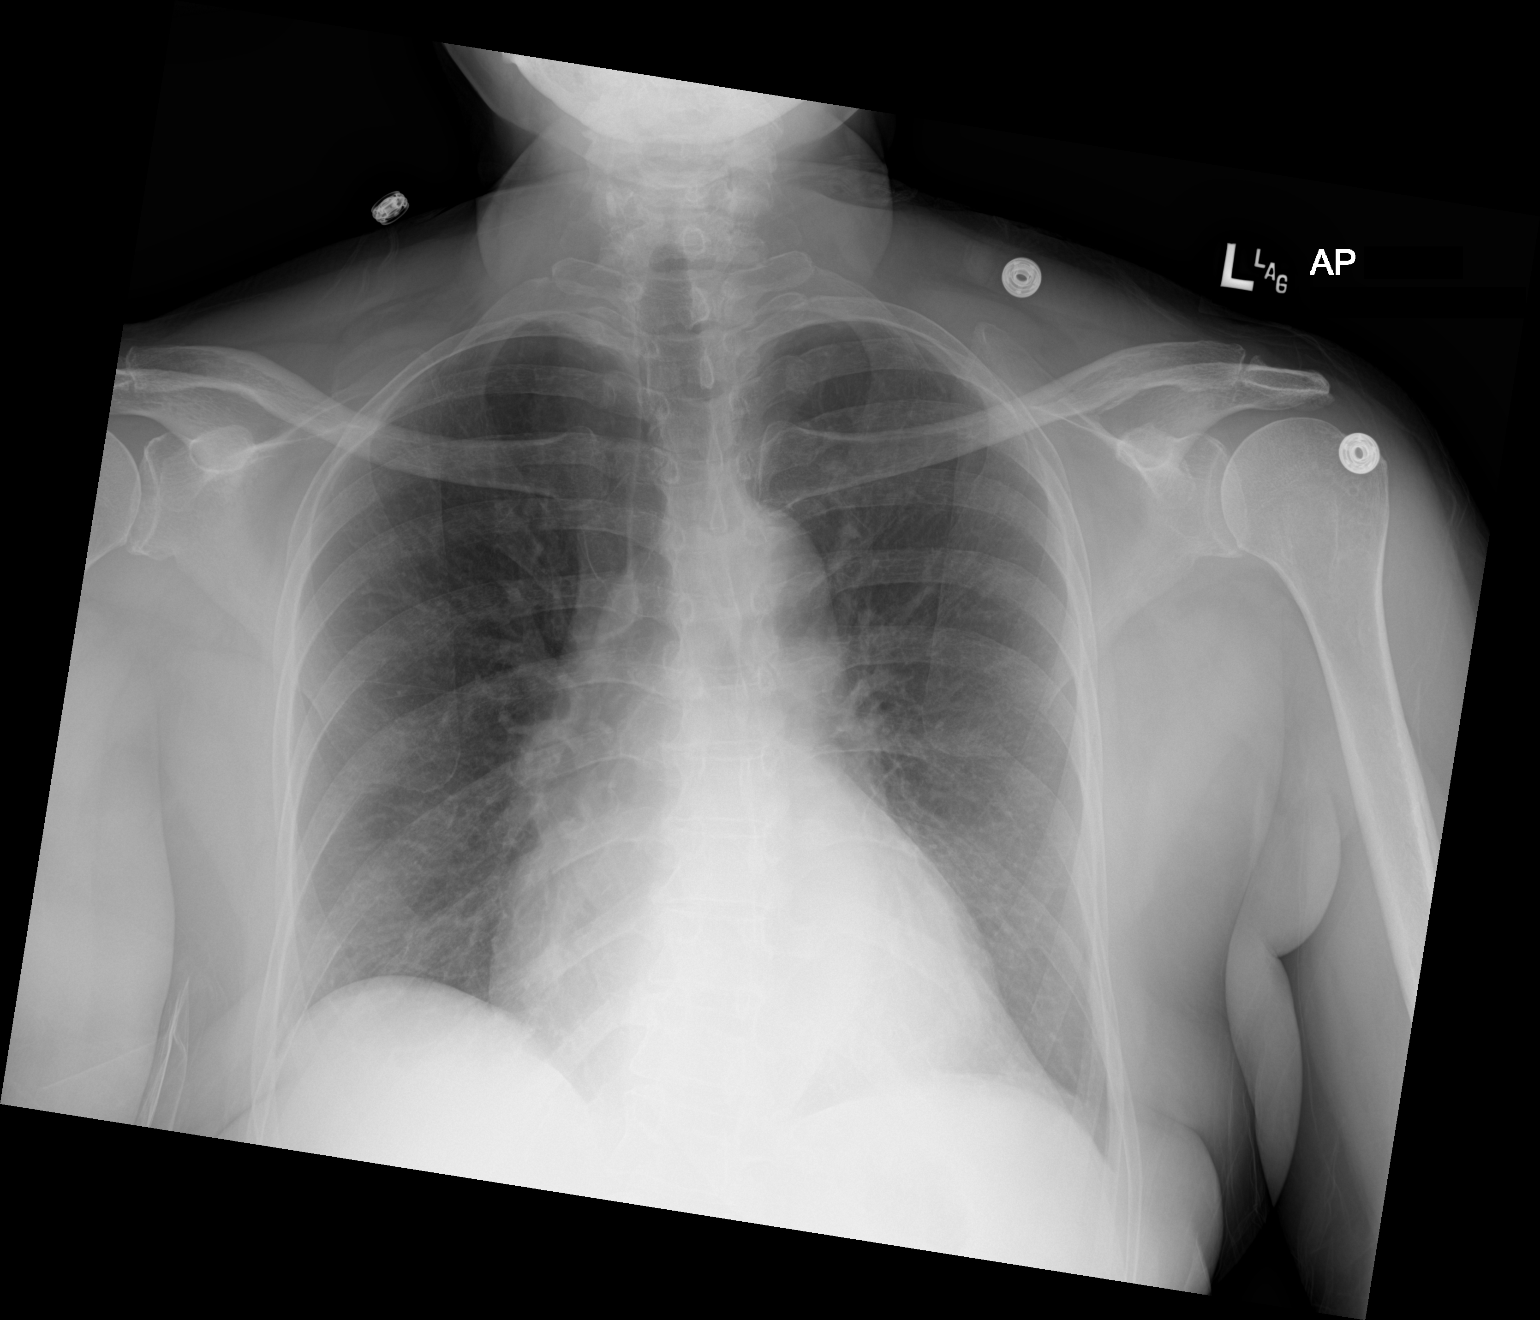

[1 of 1 positions shown; findings below may reference images not displayed]

FINDINGS: Heart is borderline in size. No confluent opacities, effusions or
edema. No acute bony abnormality.
IMPRESSION: No active disease.

## 2020-09-11 IMAGING — CT CT ABD-PELV W/ CM
2 of 5 series · 16 of 46 positions shown, 18 images · IV contrast (omnipaque)
Comparison: Chest CTA 07/07/2019. Remote abdominal CT 08/26/2005

CLINICAL DATA: Acute generalized abdominal pain. Patient reports
groin pain and back pain. Recent diagnosis of pulmonary embolus.

EXAM:
CT ABDOMEN AND PELVIS WITH CONTRAST
TECHNIQUE: Multidetector CT imaging of the abdomen and pelvis was performed
using the standard protocol following bolus administration of
intravenous contrast.
CONTRAST:  100mL OMNIPAQUE IOHEXOL 300 MG/ML  SOLN

[Series 2: axial st · axial · 0.73mm/px · z∈[-434,-79]mm · 13 of 81 slices shown, 15 images]
[im 5/81  soft-tissue]
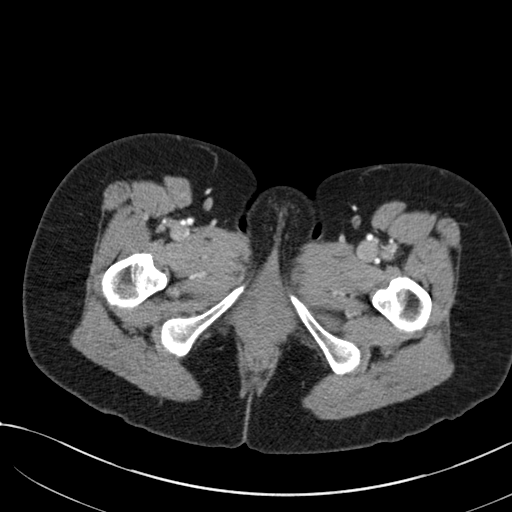
[im 5/81  bone]
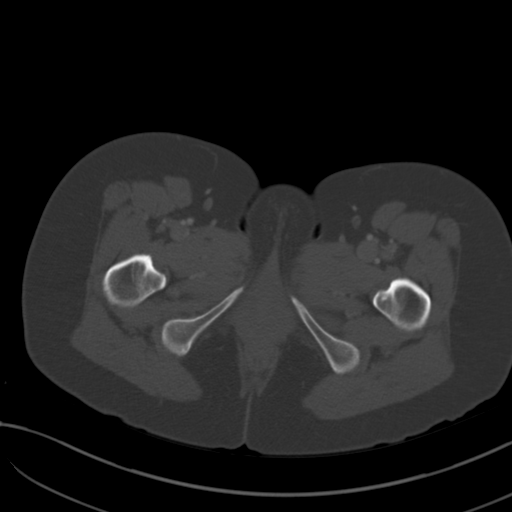
[im 10/81  soft-tissue]
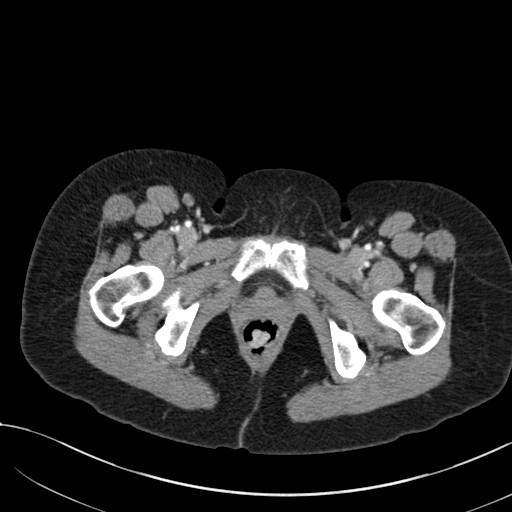
[im 19/81  soft-tissue]
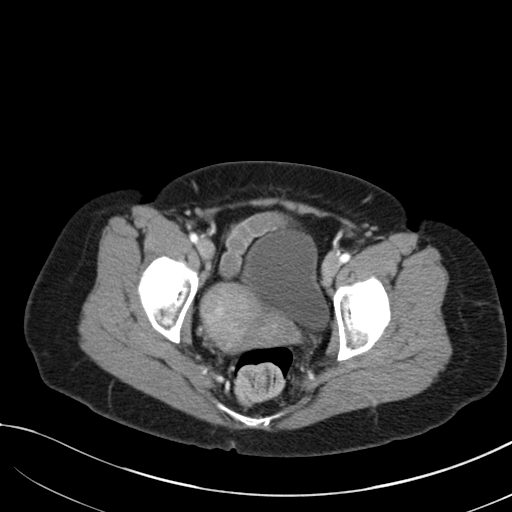
[im 24/81  soft-tissue]
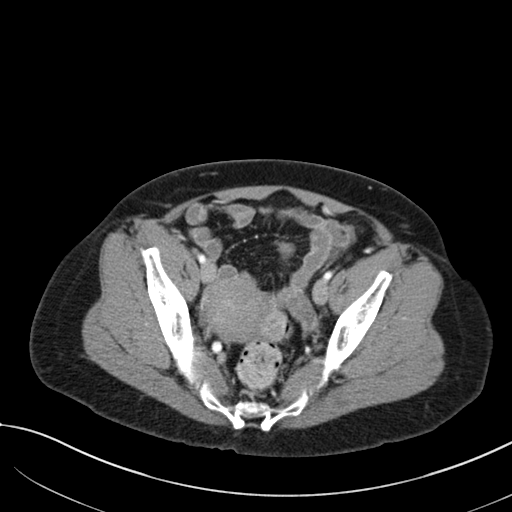
[im 29/81  soft-tissue]
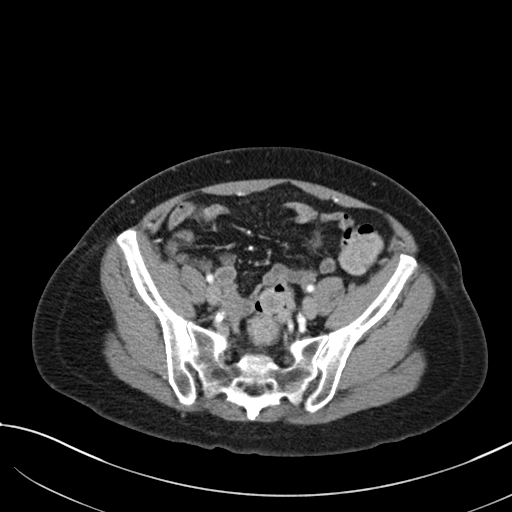
[im 33/81  soft-tissue]
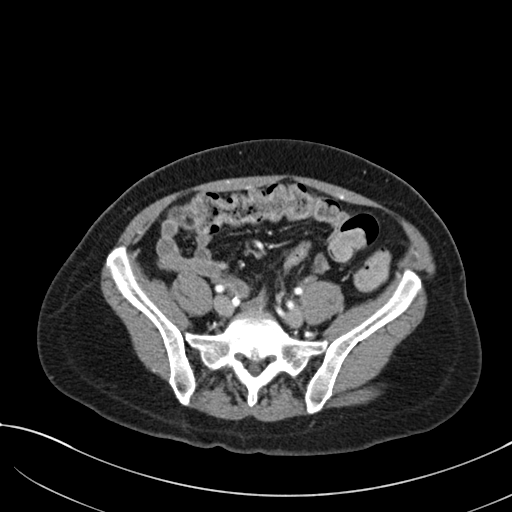
[im 43/81  soft-tissue]
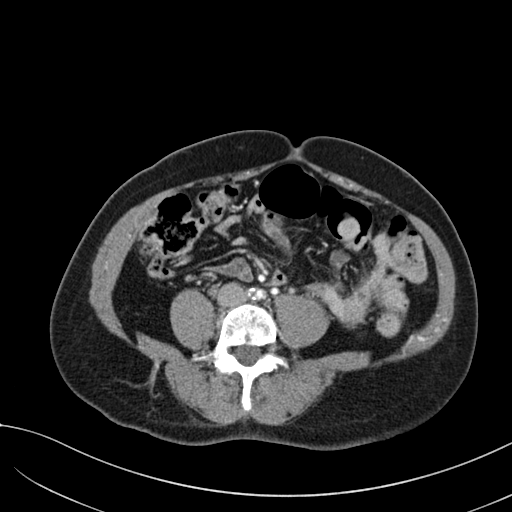
[im 48/81  soft-tissue]
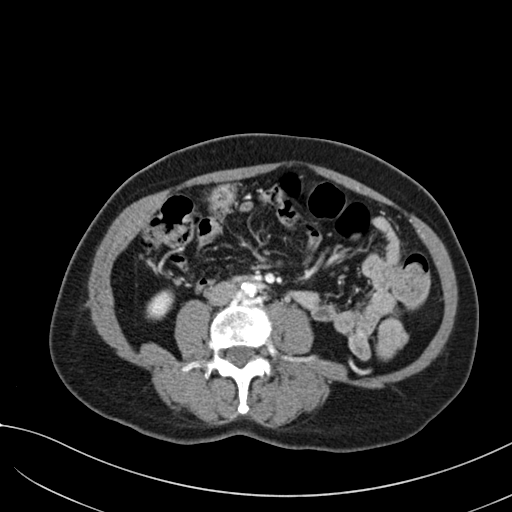
[im 52/81  soft-tissue]
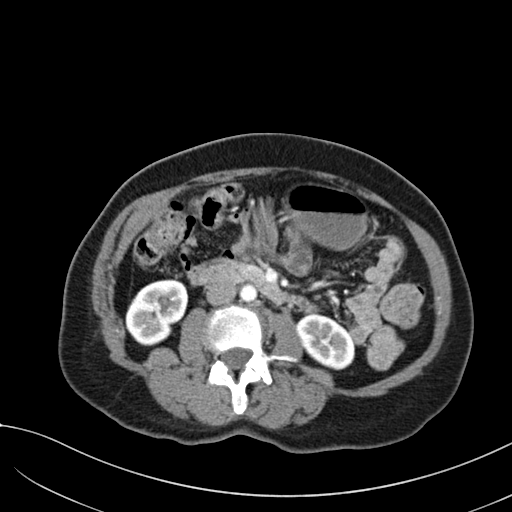
[im 52/81  bone]
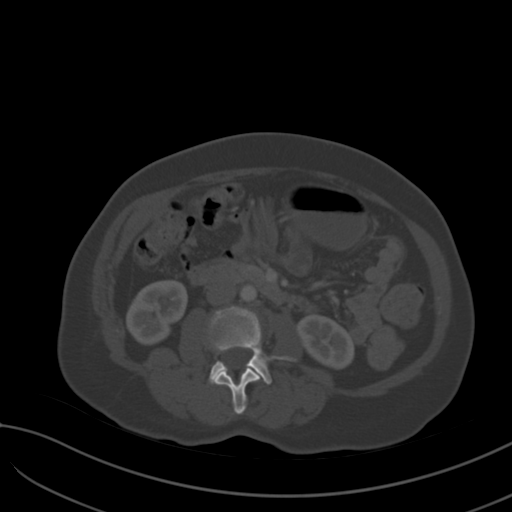
[im 57/81  soft-tissue]
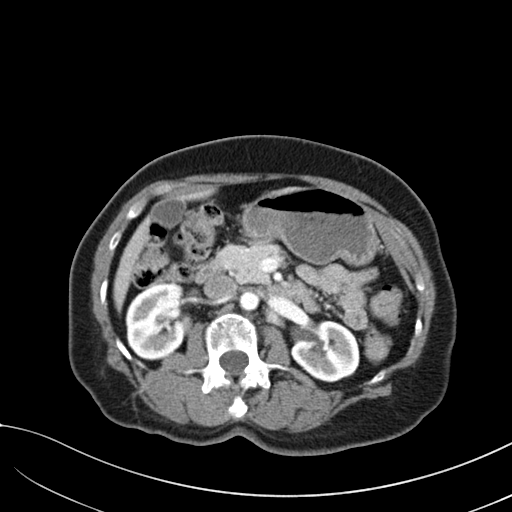
[im 62/81  soft-tissue]
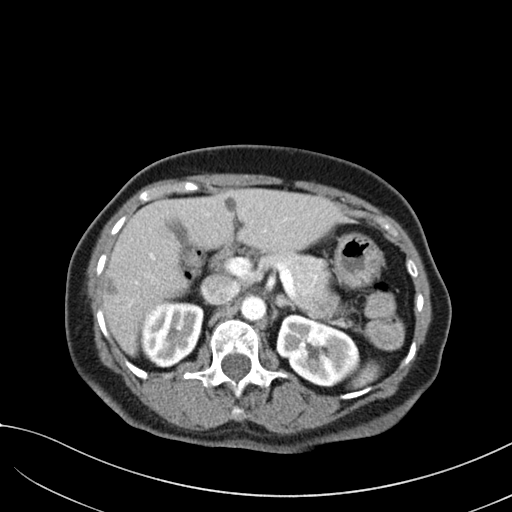
[im 71/81  soft-tissue]
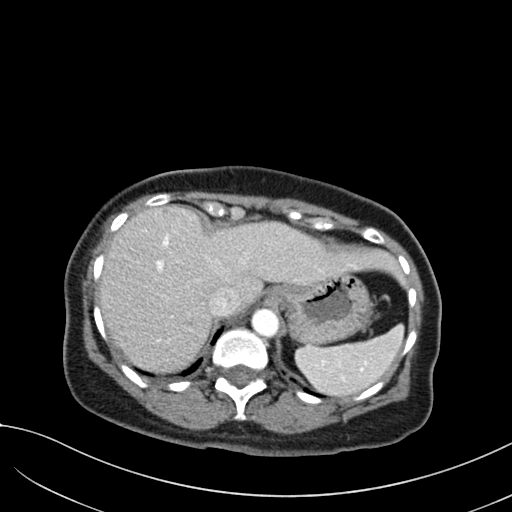
[im 76/81  soft-tissue]
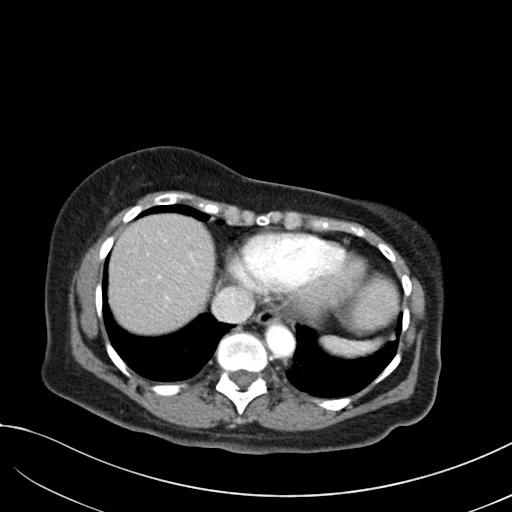

[Series 4: coronal st · coronal · 0.72mm/px · 3 of 131 slices shown]
[im 44/131  soft-tissue]
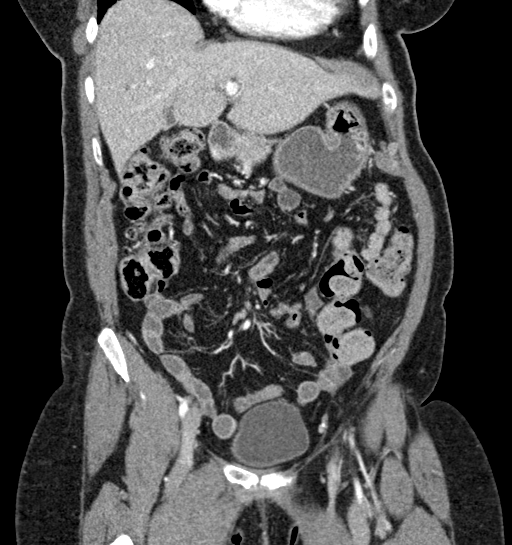
[im 58/131  soft-tissue]
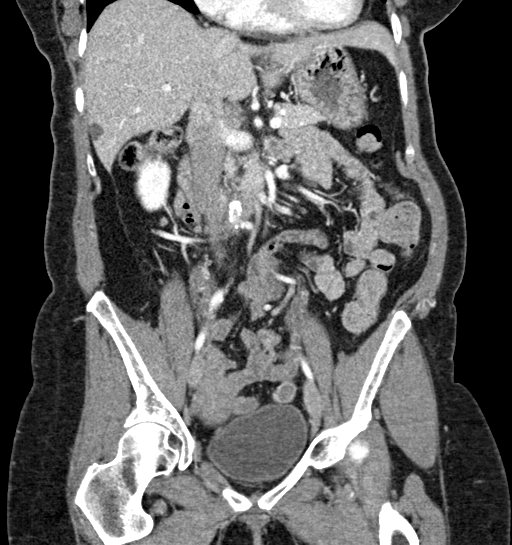
[im 73/131  soft-tissue]
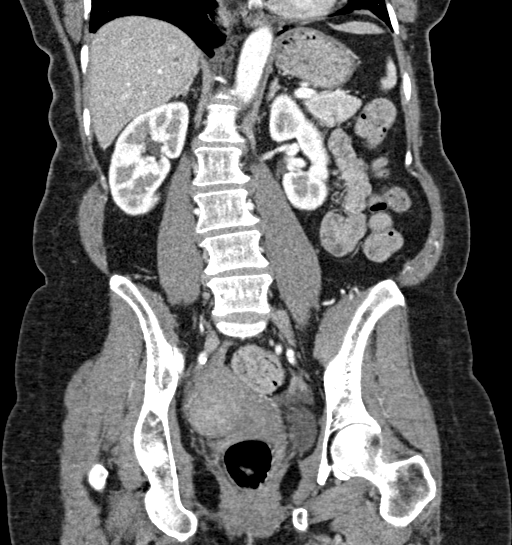

[16 of 46 positions shown; findings below may reference images not displayed]

FINDINGS: Lower chest: Near completely resolved pleural effusions from prior
exam with trace residual on the right. Improved basilar aeration
with residual atelectasis or scarring in the left lower lobe.

Hepatobiliary: Scattered cysts as well as low-density lesions that
are too small to accurately characterized within the liver at are
grossly unchanged from 3776 exam. No suspicious hepatic lesion.
Gallbladder physiologically distended, no calcified stone. No
biliary dilatation.

Pancreas: Unremarkable. No pancreatic ductal dilatation or
surrounding inflammatory changes.

Spleen: Normal in size without focal abnormality.

Adrenals/Urinary Tract: Normal adrenal glands. No hydronephrosis or
perinephric edema. Homogeneous renal enhancement with symmetric
excretion on delayed phase imaging. Urinary bladder is
physiologically distended without wall thickening.

Stomach/Bowel: Stomach is unremarkable. Normal positioning of the
ligament of Treitz. No small bowel obstruction or inflammation.
Prior appendectomy with surgical clips at the base of the cecum.
Moderate stool burden throughout the colon with colonic tortuosity.
No colonic wall thickening or inflammatory change. Mild distal
diverticulosis without diverticulitis.

Vascular/Lymphatic: Aortic atherosclerosis. Chronic occlusion of the
infrarenal aorta with distal reconstitution of the common iliac
arteries. The portal vein is patent. There are no acute vascular
findings. No adenopathy.

Reproductive: Heterogeneous uterus with a probable 4.3 cm fibroid
involving the right aspect, uterus has slightly decreased in size
from prior exam. Ovaries are not well-defined, no evidence of
adnexal mass.

Other: No free air, free fluid, or intra-abdominal fluid collection.
No inguinal or significant body wall hernia.

Musculoskeletal: There are no acute or suspicious osseous
abnormalities. Mild degenerative disc disease at L4-L5.
IMPRESSION: 1. No acute abnormality in the abdomen/pelvis.
2. Moderate stool burden throughout the colon with colonic
tortuosity, can be seen with constipation. Mild distal
diverticulosis without diverticulitis.
3. Chronic occlusion of the infrarenal aorta with distal
reconstitution of the common iliac arteries.
4. Uterine fibroid.

Aortic Atherosclerosis ([id number]).

## 2020-10-20 ENCOUNTER — Other Ambulatory Visit: Payer: Self-pay | Admitting: Cardiovascular Disease

## 2020-10-20 NOTE — Telephone Encounter (Signed)
76f, 63kg, Creatinine, Serum 0.930 mg/ 08/01/2019 , lovw/berry 12/26/19

## 2020-11-13 ENCOUNTER — Other Ambulatory Visit: Payer: Self-pay | Admitting: Cardiovascular Disease

## 2020-12-16 ENCOUNTER — Other Ambulatory Visit: Payer: Self-pay

## 2020-12-16 ENCOUNTER — Telehealth: Payer: Self-pay | Admitting: Cardiovascular Disease

## 2020-12-16 ENCOUNTER — Emergency Department (HOSPITAL_COMMUNITY): Payer: Medicare Other

## 2020-12-16 ENCOUNTER — Encounter (HOSPITAL_COMMUNITY): Payer: Self-pay

## 2020-12-16 ENCOUNTER — Emergency Department (HOSPITAL_COMMUNITY)
Admission: EM | Admit: 2020-12-16 | Discharge: 2020-12-16 | Disposition: A | Discharge status: Home / Self Care | Payer: Medicare Other | Attending: Emergency Medicine | Admitting: Emergency Medicine

## 2020-12-16 DIAGNOSIS — R079 Chest pain, unspecified: Secondary | ICD-10-CM | POA: Diagnosis not present

## 2020-12-16 DIAGNOSIS — R002 Palpitations: Secondary | ICD-10-CM | POA: Insufficient documentation

## 2020-12-16 DIAGNOSIS — Z79899 Other long term (current) drug therapy: Secondary | ICD-10-CM | POA: Diagnosis not present

## 2020-12-16 DIAGNOSIS — Z7901 Long term (current) use of anticoagulants: Secondary | ICD-10-CM | POA: Diagnosis not present

## 2020-12-16 DIAGNOSIS — I10 Essential (primary) hypertension: Secondary | ICD-10-CM | POA: Insufficient documentation

## 2020-12-16 LAB — COMPREHENSIVE METABOLIC PANEL
ALT: 12 U/L (ref 0–44)
AST: 20 U/L (ref 15–41)
Albumin: 3.9 g/dL (ref 3.5–5.0)
Alkaline Phosphatase: 112 U/L (ref 38–126)
Anion gap: 6 (ref 5–15)
BUN: 16 mg/dL (ref 8–23)
CO2: 26 mmol/L (ref 22–32)
Calcium: 8.9 mg/dL (ref 8.9–10.3)
Chloride: 106 mmol/L (ref 98–111)
Creatinine, Ser: 1.08 mg/dL — ABNORMAL HIGH (ref 0.44–1.00)
GFR, Estimated: 54 mL/min — ABNORMAL LOW (ref >60)
Glucose, Bld: 110 mg/dL — ABNORMAL HIGH (ref 70–99)
Potassium: 3.4 mmol/L — ABNORMAL LOW (ref 3.5–5.1)
Sodium: 138 mmol/L (ref 135–145)
Total Bilirubin: 0.2 mg/dL — ABNORMAL LOW (ref 0.3–1.2)
Total Protein: 7 g/dL (ref 6.5–8.1)

## 2020-12-16 LAB — CBC WITH DIFFERENTIAL/PLATELET
Abs Immature Granulocytes: 0.01 10*3/uL (ref 0.00–0.07)
Basophils Absolute: 0 10*3/uL (ref 0.0–0.1)
Basophils Relative: 0 %
Eosinophils Absolute: 0 10*3/uL (ref 0.0–0.5)
Eosinophils Relative: 0 %
HCT: 40.9 % (ref 36.0–46.0)
Hemoglobin: 13 g/dL (ref 12.0–15.0)
Immature Granulocytes: 0 %
Lymphocytes Relative: 32 %
Lymphs Abs: 1.5 10*3/uL (ref 0.7–4.0)
MCH: 28.3 pg (ref 26.0–34.0)
MCHC: 31.8 g/dL (ref 30.0–36.0)
MCV: 88.9 fL (ref 80.0–100.0)
Monocytes Absolute: 0.4 10*3/uL (ref 0.1–1.0)
Monocytes Relative: 9 %
Neutro Abs: 2.7 10*3/uL (ref 1.7–7.7)
Neutrophils Relative %: 59 %
Platelets: 387 10*3/uL (ref 150–400)
RBC: 4.6 MIL/uL (ref 3.87–5.11)
RDW: 14 % (ref 11.5–15.5)
WBC: 4.6 10*3/uL (ref 4.0–10.5)
nRBC: 0 % (ref 0.0–0.2)

## 2020-12-16 LAB — D-DIMER, QUANTITATIVE: D-Dimer, Quant: <0.27 ug/mL-FEU (ref 0.00–0.50)

## 2020-12-16 LAB — TROPONIN I (HIGH SENSITIVITY)
Troponin I (High Sensitivity): 12 ng/L (ref <18)
Troponin I (High Sensitivity): 12 ng/L (ref <18)

## 2020-12-16 NOTE — Telephone Encounter (Signed)
Returned call to patient of Dr. Allyson Sabal who reports shortness of breath, heart fluttering, pain in her neck, weakness. She almost decided to go to the ED. Symptoms have been going on for 5 days. She cannot make MD appointment this afternoon at 3pm and does not think she can wait until next available on Thursday 9/8 with Verneita Griffes PA. She will have her husband take her to [facility] ED when he arrives home from work. Notified Psychologist, forensic (cardmaster)

## 2020-12-16 NOTE — Telephone Encounter (Signed)
   Pt c/o Shortness Of Breath: STAT if SOB developed within the last 24 hours or pt is noticeably SOB on the phone  1. Are you currently SOB (can you hear that pt is SOB on the phone)? No  2. How long have you been experiencing SOB? 5 days ago  3. Are you SOB when sitting or when up moving around? both  4. Are you currently experiencing any other symptoms?  Pt said she's been feeling SOB for the last 5 days now, she can also feel her heart beat is skipping, she would like to ask if Dr. Allyson Sabal can order a tests to check if there's something wrong with her haert, she gave BP readings: 168/82 157/73

## 2020-12-16 NOTE — ED Provider Notes (Signed)
[facility] EMERGENCY DEPARTMENT Provider Note   CSN: [id number] Arrival date & time: 12/16/20  1709     History Chief Complaint  Patient presents with   Chest Pain    Hannah [name] is a 75 y.o. female.  With a past medical history of pulmonary embolus, hypertension, peripheral arterial disease, who presents emergency department with palpitations.  Patient states that for the last week she has had on and off episodes of racing, skipping, and palpitating heart.  She has associated chest pressure and pain centrally with shortness of breath.  Patient states that she has felt more winded and is taken a diuretic which seemed to help.  She denies any weight gain or peripheral edema.  Patient also states that this feels similar to her previous episode of pulmonary embolism except that this has been intermittent and her last episode was constant.  She is on a new medication to lower her correct cholesterol but she is unable to tell me what it is and she feels like this might be a side effect of her medications.  She denies feelings of presyncope, nausea, vomiting or diaphoresis.  Chest Pain  HPI: A 75 year old patient with a history of peripheral artery disease and hypertension presents for evaluation of chest pain. Initial onset of pain was more than 6 hours ago. The patient's chest pain is not worse with exertion. The patient's chest pain is middle- or left-sided, is not well-localized, is not described as heaviness/pressure/tightness, is not sharp and does not radiate to the arms/jaw/neck. The patient does not complain of nausea and denies diaphoresis. The patient has no history of stroke, has not smoked in the past 90 days, denies any history of treated diabetes, has no relevant family history of coronary artery disease (first degree relative at less than age 12), has no history of hypercholesterolemia and does not have an elevated BMI (>=30).   Past Medical History:   Diagnosis Date   Acute pulmonary embolism (HCC) 07/07/2019   Chest pain with normal coronary angiography 08/09/2014   Cath 2013   Dyslipidemia 06/25/2013   Zetia added Feb 2020- PCP follows   Essential hypertension 06/25/2013   Echo June 2020- EF 50-55%, diastolic dysfunction   Family history of coronary artery disease 01/17/2019   Father and two brothers with early CAD   Family history of heart disease    Hyperlipidemia    Hypertension    Leriche syndrome (HCC)    Normocytic anemia 11/28/2016   Palpitations 06/08/2016   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 Some claudication but she declines further work up. ABis moderately decreased March 2020   Pulmonary emboli (HCC) 07/07/2019   Acute pulmonary lesion    Patient Active Problem List   Diagnosis Date Noted   Pulmonary emboli (HCC) 07/07/2019   Acute pulmonary embolism (HCC) 07/07/2019   Asymptomatic bacteriuria 07/07/2019   Normocytic anemia 11/28/2016   Palpitations 06/08/2016   Chest pain with normal coronary angiography 08/09/2014   Peripheral arterial disease (HCC) 06/25/2013   Essential hypertension 06/25/2013   Dyslipidemia 06/25/2013    Past Surgical History:  Procedure Laterality Date   ABI - BILATERAL  2008   moderate arterial insufficiency at rest; pulsatile flow of bilateral ankle PVRs   APPENDECTOMY  11/21/2002   acute appendicitis   LEFT HEART CATHETERIZATION WITH CORONARY ANGIOGRAM N/A 11/10/2011   Procedure: LEFT HEART CATHETERIZATION WITH  CORONARY ANGIOGRAM;  Surgeon: Runell GessJonathan J Berry, MD;  [facility];  Service: Cardiovascular;  Laterality: N/A; - normal left main/LAD/L Cfx/RCA   NM MYOCAR PERF WALL MOTION  10/2011   lexiscan - EF71%, normal perfusion; low risk scan   ROTATOR CUFF REPAIR  1995   TRANSTHORACIC ECHOCARDIOGRAM  10/2011   EF=>55%, calcified moderator band in the RV; borderline LA  enlargement; mild MR; mild TR; trace AV regurg & pulm valve regurg     OB History   No obstetric history on file.     Family History  Problem Relation Age of Onset   Heart disease Father    Hypertension Father    Cancer Father    Heart attack Father        in 6730s   Hypertension Brother        2 with CABG   Stroke Sister    Hypertension Sister    Heart Problems Child        born with enlarged heart    Social History   Tobacco Use   Smoking status: Former   Smokeless tobacco: Never  Substance Use Topics   Alcohol use: Yes    Comment: occasional   Drug use: No    Home Medications Prior to Admission medications   Medication Sig Start Date End Date Taking? Authorizing Provider  amLODipine (NORVASC) 2.5 MG tablet TAKE 1 TABLET(2.5 MG) BY MOUTH DAILY 11/13/20   Runell GessBerry, Jonathan J, MD  Coenzyme Q-10 200 MG CAPS Take 200 mg by mouth daily.     [provider]  DEXILANT 60 MG capsule Take 60 mg by mouth daily. 08/25/15   [provider]  diazepam (VALIUM) 5 MG tablet Take 2.5 mg by mouth daily.  06/01/13   [provider]  ELIQUIS 5 MG TABS tablet TAKE 1 TABLET BY MOUTH TWICE DAILY 10/20/20   Runell GessBerry, Jonathan J, MD  furosemide (LASIX) 20 MG tablet Take 20 mg by mouth daily.  01/04/18   [provider]  metoprolol tartrate (LOPRESSOR) 25 MG tablet TAKE 1 TABLET(25 MG) BY MOUTH TWICE DAILY 02/18/20   Runell GessBerry, Jonathan J, MD  rosuvastatin (CRESTOR) 40 MG tablet Take 40 mg by mouth daily.    [provider]  tobramycin-dexamethasone Wallene Dales(TOBRADEX) ophthalmic ointment 1 application 3 (three) times daily.    [provider]  Vitamin D, Ergocalciferol, (DRISDOL) 50000 units CAPS capsule Take 50,000 Units by mouth every Wednesday.  01/11/18   [provider]    Allergies    Amoxicillin and Ramipril  Review of Systems   Review of Systems  Cardiovascular:  Positive for chest pain.  Ten systems reviewed and are negative for acute  change, except as noted in the HPI.   Physical Exam Updated Vital Signs BP 136/70   Pulse 75   Temp 98.8 F (37.1 C) (Oral)   Resp 19   SpO2 100%   Physical Exam Vitals and nursing note reviewed.  Constitutional:      General: She is not in acute distress.    Appearance: She is well-developed. She is not diaphoretic.  HENT:     Head: Normocephalic and atraumatic.     Right Ear: External ear normal.     Left Ear: External ear normal.     Nose: Nose normal.     Mouth/Throat:     Mouth: Mucous membranes are moist.  Eyes:     General: No scleral icterus.    Conjunctiva/sclera: Conjunctivae normal.  Cardiovascular:  Rate and Rhythm: Normal rate and regular rhythm.     Heart sounds: Normal heart sounds. No murmur heard.   No friction rub. No gallop.  Pulmonary:     Effort: Pulmonary effort is normal. No tachypnea or respiratory distress.     Breath sounds: Normal breath sounds. No decreased breath sounds, wheezing or rales.  Abdominal:     General: Bowel sounds are normal. There is no distension.     Palpations: Abdomen is soft. There is no mass.     Tenderness: There is no abdominal tenderness. There is no guarding.  Musculoskeletal:     Cervical back: Normal range of motion.     Right lower leg: No edema.     Left lower leg: No edema.  Skin:    General: Skin is warm and dry.  Neurological:     Mental Status: She is alert and oriented to person, place, and time.  Psychiatric:        Behavior: Behavior normal.    ED Results / Procedures / Treatments   Labs (all labs ordered are listed, but only abnormal results are displayed) Labs Reviewed  COMPREHENSIVE METABOLIC PANEL - Abnormal; Notable for the following components:      Result Value   Potassium 3.4 (*)    Glucose, Bld 110 (*)    Creatinine, Ser 1.08 (*)    Total Bilirubin 0.2 (*)    GFR, Estimated 54 (*)    All other components within normal limits  CBC WITH DIFFERENTIAL/PLATELET  D-DIMER, QUANTITATIVE   TROPONIN I (HIGH SENSITIVITY)  TROPONIN I (HIGH SENSITIVITY)    EKG EKG Interpretation  Date/Time:  Tuesday December 16 2020 17:23:36 EDT Ventricular Rate:  89 PR Interval:  152 QRS Duration: 70 QT Interval:  368 QTC Calculation: 447 R Axis:   41 Text Interpretation: Normal sinus rhythm Nonspecific T wave abnormality Abnormal ECG Confirmed by Margarita Grizzle [phone]) on 12/16/2020 8:58:22 PM  Radiology DG Chest 2 View  Result Date: 12/16/2020 CLINICAL DATA:  Chest pain and shortness of breath. Weakness and dizziness. EXAM: CHEST - 2 VIEW COMPARISON:  Chest radiograph 08/01/2019, chest CT 07/07/2019 FINDINGS: The cardiomediastinal contours are normal. Mild chronic elevation of right hemidiaphragm. Minimal streaky scarring in the right middle lobe. Pulmonary vasculature is normal. No consolidation, pleural effusion, or pneumothorax. Thoracolumbar scoliosis. No acute osseous abnormalities are seen. IMPRESSION: No acute chest findings. Electronically Signed   By: Narda Rutherford M.D.   On: 12/16/2020 18:36    Procedures Procedures   Medications Ordered in ED Medications - No data to display  ED Course  I have reviewed the triage vital signs and the nursing notes.  Pertinent labs & imaging results that were available during my care of the patient were reviewed by me and considered in my medical decision making (see chart for details).    MDM Rules/Calculators/A&P HEAR Score: 4                         74 year old female with a history of pulmonary emboli compliant with Eliquis presents the emergency department with chief complaint of chest pain, palpitations and fluttering.  She has had multiple episodes of intermittent PVCs without persistent arrhythmia here in the emergency department. I reviewed the patient's EKG which shows some mild flattening of T waves in the lateral leads as compared to previous without reciprocal change. I ordered and reviewed labs that include a CBC, D-dimer  within normal limits.  She has  2 negative troponins over time. CMP shows mild hypokalemia of insignificant value, she has slight elevation in her creatinine likely some mild dehydration also of insignificant value.  Patient able to ambulate in the emergency department without hypoxia or marked shortness of breath.  Given the large differential diagnosis for Camie Patience, the decision making in this case is of high complexity.  After evaluating all of the data points in this case, the presentation of Alzora Ha is NOT consistent with Acute Coronary Syndrome (ACS) and/or myocardial ischemia, pulmonary embolism, aortic dissection; Borhaave's, significant arrythmia, pneumothorax, cardiac tamponade, or other emergent cardiopulmonary condition.  Further, the presentation of Billi Bright is NOT consistent with pericarditis, myocarditis, cholecystitis, pancreatitis, mediastinitis, endocarditis, new valvular disease.  Additionally, the presentation of Machelle Raybon Burnsis NOT consistent with flail chest, cardiac contusion, ARDS, or significant intra-thoracic or intra-abdominal bleeding.  Moreover, this presentation is NOT consistent with pneumonia, sepsis, or pyelonephritis.  Strict return and follow-up precautions have been given by me personally or by detailed written instruction given verbally by nursing staff using the teach back method to the patient/family/caregiver(s).  Data Reviewed/Counseling: I have reviewed the patient's vital signs, nursing notes, and other relevant tests/information. I had a detailed discussion regarding the historical points, exam findings, and any diagnostic results supporting the discharge diagnosis. I also discussed the need for outpatient follow-up and the need to return to the ED if symptoms worsen or if there are any questions or concerns that arise at home.   Final Clinical Impression(s) / ED Diagnoses Final diagnoses:  Palpitations    Rx /  DC Orders ED Discharge Orders     None        Arthor Captain, PA-C 12/17/20 1016    Margarita Grizzle, MD 12/18/20 1623

## 2020-12-16 NOTE — Discharge Instructions (Addendum)
Please follow up with your cardiologist   Contact a health care provider if you: Continue to have a fast or irregular heartbeat after 24 hours. Notice that your palpitations occur more often. Get help right away if you: Have chest pain or shortness of breath. Have a severe headache. Feel dizzy or you faint.

## 2020-12-16 NOTE — ED Provider Notes (Signed)
Emergency Medicine Provider Triage Evaluation Note  Hannah [name] , a 75 y.o. female  was evaluated in triage.  Pt complains of chest pain, shortness of breath and left-sided neck pain.  Symptoms have been intermittent over the last week.  Patient reports that chest pain began approximately 0900 this morning and has been constant since then.  Chest pain is located to left side of her chest, no radiation.  Describes pain as a squeezing.  Associated shortness of breath.  No associated nausea, vomiting, or diaphoresis.  Patient reports pain to left side of her neck.  Denies any traumatic injuries or falls.  Pain is worse with touch.  Patient takes blood thinner Eliquis.  Review of Systems  Positive: Chest pain, shortness of breath, left-sided neck pain, lightheadedness Negative: Hemoptysis, unilateral leg swelling or tenderness, nausea, vomiting, diaphoresis, abdominal pain, syncope.  Physical Exam  BP 139/74 (BP Location: Right Arm)   Pulse 88   Temp 98.4 F (36.9 C) (Oral)   Resp 16   SpO2 100%  Gen:   Awake, no distress   Resp:  Normal effort, lungs clear to auscultation bilaterally MSK:   Moves extremities without difficulty; no swelling or tenderness to bilateral lower extremities Other:  +2 carotid and radial pulses bilaterally.  Abdomen soft, nondistended, nontender, no mass or pulsatile mass.  Medical Decision Making  Medically screening exam initiated at 5:37 PM.  Appropriate orders placed.  Alphonse Guild Brar was informed that the remainder of the evaluation will be completed by another provider, this initial triage assessment does not replace that evaluation, and the importance of remaining in the ED until their evaluation is complete.  The patient appears stable so that the remainder of the work up may be completed by another provider.      Haskel Schroeder, PA-C 12/16/20 1740    Maia Plan, MD 12/16/20 [phone]

## 2020-12-16 NOTE — ED Triage Notes (Signed)
Pt presents with mid-sternum chest tightness and SOB x5 days, new pain to Left lateral neck starting today

## 2020-12-16 NOTE — ED Notes (Signed)
Pt ambulated in hall, SPO2 held at 100%, no distress noted.

## 2021-01-20 ENCOUNTER — Ambulatory Visit: Payer: Medicare Other | Admitting: Cardiovascular Disease

## 2021-02-13 ENCOUNTER — Other Ambulatory Visit: Payer: Self-pay | Admitting: Cardiovascular Disease

## 2021-03-11 ENCOUNTER — Ambulatory Visit: Payer: Medicare Other | Admitting: Cardiovascular Disease

## 2021-03-27 ENCOUNTER — Other Ambulatory Visit: Payer: Self-pay

## 2021-03-27 ENCOUNTER — Encounter: Payer: Self-pay | Admitting: Cardiovascular Disease

## 2021-03-27 ENCOUNTER — Ambulatory Visit (INDEPENDENT_AMBULATORY_CARE_PROVIDER_SITE_OTHER): Payer: Medicare Other | Admitting: Cardiovascular Disease

## 2021-03-27 DIAGNOSIS — E785 Hyperlipidemia, unspecified: Secondary | ICD-10-CM | POA: Diagnosis not present

## 2021-03-27 DIAGNOSIS — I739 Peripheral vascular disease, unspecified: Secondary | ICD-10-CM

## 2021-03-27 DIAGNOSIS — R079 Chest pain, unspecified: Secondary | ICD-10-CM

## 2021-03-27 DIAGNOSIS — I2699 Other pulmonary embolism without acute cor pulmonale: Secondary | ICD-10-CM

## 2021-03-27 DIAGNOSIS — I10 Essential (primary) hypertension: Secondary | ICD-10-CM | POA: Diagnosis not present

## 2021-03-27 DIAGNOSIS — R002 Palpitations: Secondary | ICD-10-CM

## 2021-03-27 NOTE — Patient Instructions (Signed)

## 2021-03-27 NOTE — Assessment & Plan Note (Signed)
History of essential hypertension blood pressure measured today 138/60.  She is on amlodipine and metoprolol.

## 2021-03-27 NOTE — Assessment & Plan Note (Signed)
History of acute pulmonary embolism that occurred 4 days after her Pfizer COVID shot.  She remains on Eliquis oral anticoagulation.

## 2021-03-27 NOTE — Assessment & Plan Note (Signed)
History of hyperlipidemia on high-dose rosuvastatin and Praluent followed by her PCP.

## 2021-03-27 NOTE — Assessment & Plan Note (Signed)
History of occasional atypical chest pain with clean coronary catheterization which I performed 11/10/2011.

## 2021-03-27 NOTE — Assessment & Plan Note (Signed)
History of palpitations with event monitor performed 08/03/2018 revealing episodes of PSVT and nonsustained VT.  These have significantly improved

## 2021-03-27 NOTE — Assessment & Plan Note (Signed)
History of peripheral arterial disease with known distal abdominal aortic occlusion with ABIs in the 0.7 range bilaterally.  She has mild claudication which is not lifestyle limiting and does not wish surgical revascularization at this time.

## 2021-03-27 NOTE — Progress Notes (Signed)
03/27/2021 Hannah [name]   1946-01-02  TA:[id number]  Primary Physician Jilda Panda, MD Primary Cardiologist: Lorretta Harp MD Lupe Carney, [place]  HPI:  Hannah [name] is a 75 y.o.   thin-appearing married African American female, mother of 1 child, who I last saw in the office 12/26/2019.  She has a history of normal coronary arteries by catheterization, which I performed November 10, 2011. 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 She really denies significant lifestyle-limiting claudication. Her other problems include remote tobacco abuse, having quit over 20 years ago, treated hypertension, hyperlipidemia, as well as a strong family history for heart disease with a father who had an MI in his 27s and 2 brothers who have had bypass surgery. Since I saw her a year ago , she did have some cramping in her right calf however venous Dopplers were negative for DVT. 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 She ALSO gets occasional morning palpitations and has worn an event monitor in the past which was unrevealing.  Since I saw her a year ago she is remained stable.  She complains of occasional intermittent chest pain, shortness of breath and palpitations although her work-up has been negative.  Dopplers revealed ABIs in the 0.7 range bilaterally. Since I saw her 6 months ago she continues to complain of lifestyle limiting claudication although she does not wish surgical revascularization.  Her ABIs performed yesterday were unchanged.  She also complains of shortness of breath and morning palpitations.  She drinks 1 cup of coffee a day.  Prior work-up for palpitations performed years ago with an event monitor showed only sinus rhythm.   Lower  extremity  arterial Doppler studies performed 06/13/2018 revealed a right ABI 0.69 and a left ABI 0.74.  Recent event monitor performed 08/03/2018 revealed episodes of PSVT and nonsustained ventricular tachycardia.  Her last 2D echo performed 11/29/2016 was essentially normal.     She was admitted on 07/07/2019 to [facility] with chest pain and shortness of breath.  A D-dimer was mildly elevated and it chest CTA was remarkable for pulmonary embolus for which she was placed on Eliquis oral anticoagulation.  This occurred 4 days after her second Pfizer vaccine shot.   Since I saw her in the office a year ago she has remained stable.  She still occasionally gets palpitations on beta-blocker.  She has some claudication but it is not lifestyle limiting.  She remains on Eliquis oral anticoagulation.  Current Meds  Medication Sig   amLODipine (NORVASC) 2.5 MG tablet TAKE 1 TABLET(2.5 MG) BY MOUTH DAILY   DEXILANT 60 MG capsule Take 60 mg by mouth daily.   diazepam (VALIUM) 5 MG tablet Take 2.5 mg by mouth daily.    ELIQUIS 5 MG TABS tablet TAKE 1 TABLET BY MOUTH TWICE DAILY   metoprolol tartrate (LOPRESSOR) 25 MG tablet TAKE 1 TABLET(25 MG) BY MOUTH TWICE DAILY   PRALUENT 150 MG/ML SOAJ Inject into the skin.   rosuvastatin (CRESTOR) 40 MG tablet Take 40 mg by mouth daily.   Vitamin D, Ergocalciferol, (DRISDOL) 50000 units CAPS capsule Take 50,000 Units by mouth every Wednesday.      Allergies  Allergen Reactions   Amoxicillin Swelling   Ramipril Swelling    Angioedema  Social History   Socioeconomic History   Marital status: Married    Spouse name: Not on file   Number of children: 1   Years of education: 12   Highest education level: Not on file  Occupational History   Not on file  Tobacco Use   Smoking status: Former   Smokeless tobacco: Never  Substance and Sexual Activity   Alcohol use: Yes    Comment: occasional   Drug use: No   Sexual activity: Not on file  Other Topics Concern    Not on file  Social History Narrative   Not on file   Social Determinants of Health   Financial Resource Strain: Not on file  Food Insecurity: Not on file  Transportation Needs: Not on file  Physical Activity: Not on file  Stress: Not on file  Social Connections: Not on file  Intimate Partner Violence: Not on file     Review of Systems: General: negative for chills, fever, night sweats or weight changes.  Cardiovascular: negative for chest pain, dyspnea on exertion, edema, orthopnea, palpitations, paroxysmal nocturnal dyspnea or shortness of breath Dermatological: negative for rash Respiratory: negative for cough or wheezing Urologic: negative for hematuria Abdominal: negative for nausea, vomiting, diarrhea, bright red blood per rectum, melena, or hematemesis Neurologic: negative for visual changes, syncope, or dizziness All other systems reviewed and are otherwise negative except as noted above.    Blood pressure 138/60, pulse 72, height 5\' 3"  (1.6 m), weight 130 lb 12.8 oz (59.3 kg), SpO2 95 %.  General appearance: alert and no distress Neck: no adenopathy, no carotid bruit, no JVD, supple, symmetrical, trachea midline, and thyroid not enlarged, symmetric, no tenderness/mass/nodules Lungs: clear to auscultation bilaterally Heart: regular rate and rhythm, S1, S2 normal, no murmur, click, rub or gallop Extremities: extremities normal, atraumatic, no cyanosis or edema Pulses: Diminished pedal pulses Skin: Skin color, texture, turgor normal. No rashes or lesions Neurologic: Grossly normal  EKG not performed today  ASSESSMENT AND PLAN:   Peripheral arterial disease (HCC) History of peripheral arterial disease with known distal abdominal aortic occlusion with ABIs in the 0.7 range bilaterally.  She has mild claudication which is not lifestyle limiting and does not wish surgical revascularization at this time.  Essential hypertension History of essential hypertension blood  pressure measured today 138/60.  She is on amlodipine and metoprolol.  Dyslipidemia History of hyperlipidemia on high-dose rosuvastatin and Praluent followed by her PCP.  Chest pain with normal coronary angiography History of occasional atypical chest pain with clean coronary catheterization which I performed 11/10/2011.  Palpitations History of palpitations with event monitor performed 08/03/2018 revealing episodes of PSVT and nonsustained VT.  These have significantly improved  Acute pulmonary embolism (HCC) History of acute pulmonary embolism that occurred 4 days after her Pfizer COVID shot.  She remains on Eliquis oral anticoagulation.     08/05/2018 MD FACP,FACC,FAHA, [facility] 03/27/2021 9:44 AM

## 2021-04-08 ENCOUNTER — Emergency Department (HOSPITAL_BASED_OUTPATIENT_CLINIC_OR_DEPARTMENT_OTHER): Payer: Medicare Other

## 2021-04-08 ENCOUNTER — Encounter (HOSPITAL_BASED_OUTPATIENT_CLINIC_OR_DEPARTMENT_OTHER): Payer: Self-pay | Admitting: *Deleted

## 2021-04-08 ENCOUNTER — Other Ambulatory Visit: Payer: Self-pay

## 2021-04-08 ENCOUNTER — Emergency Department (HOSPITAL_BASED_OUTPATIENT_CLINIC_OR_DEPARTMENT_OTHER)
Admission: EM | Admit: 2021-04-08 | Discharge: 2021-04-08 | Disposition: A | Discharge status: Home / Self Care | Payer: Medicare Other | Attending: Emergency Medicine | Admitting: Emergency Medicine

## 2021-04-08 ENCOUNTER — Telehealth: Payer: Self-pay | Admitting: Cardiovascular Disease

## 2021-04-08 DIAGNOSIS — Z7901 Long term (current) use of anticoagulants: Secondary | ICD-10-CM | POA: Diagnosis not present

## 2021-04-08 DIAGNOSIS — Z79899 Other long term (current) drug therapy: Secondary | ICD-10-CM | POA: Diagnosis not present

## 2021-04-08 DIAGNOSIS — R079 Chest pain, unspecified: Secondary | ICD-10-CM | POA: Diagnosis not present

## 2021-04-08 DIAGNOSIS — Z87891 Personal history of nicotine dependence: Secondary | ICD-10-CM | POA: Diagnosis not present

## 2021-04-08 DIAGNOSIS — I10 Essential (primary) hypertension: Secondary | ICD-10-CM | POA: Diagnosis not present

## 2021-04-08 DIAGNOSIS — R051 Acute cough: Secondary | ICD-10-CM

## 2021-04-08 LAB — TROPONIN I (HIGH SENSITIVITY)
Troponin I (High Sensitivity): 10 ng/L (ref <18)
Troponin I (High Sensitivity): 10 ng/L (ref <18)

## 2021-04-08 LAB — CBC
HCT: 39 % (ref 36.0–46.0)
Hemoglobin: 12.6 g/dL (ref 12.0–15.0)
MCH: 28.3 pg (ref 26.0–34.0)
MCHC: 32.3 g/dL (ref 30.0–36.0)
MCV: 87.4 fL (ref 80.0–100.0)
Platelets: 363 10*3/uL (ref 150–400)
RBC: 4.46 MIL/uL (ref 3.87–5.11)
RDW: 14.2 % (ref 11.5–15.5)
WBC: 5.8 10*3/uL (ref 4.0–10.5)
nRBC: 0 % (ref 0.0–0.2)

## 2021-04-08 LAB — BASIC METABOLIC PANEL
Anion gap: 8 (ref 5–15)
BUN: 14 mg/dL (ref 8–23)
CO2: 25 mmol/L (ref 22–32)
Calcium: 8.5 mg/dL — ABNORMAL LOW (ref 8.9–10.3)
Chloride: 105 mmol/L (ref 98–111)
Creatinine, Ser: 0.93 mg/dL (ref 0.44–1.00)
GFR, Estimated: >60 mL/min (ref >60)
Glucose, Bld: 95 mg/dL (ref 70–99)
Potassium: 3.7 mmol/L (ref 3.5–5.1)
Sodium: 138 mmol/L (ref 135–145)

## 2021-04-08 MED ORDER — BENZONATATE 100 MG PO CAPS: 100.0000 mg | ORAL_CAPSULE | Freq: Two times a day (BID) | ORAL | 0 refills | Status: DC | PRN

## 2021-04-08 MED ORDER — DOXYCYCLINE HYCLATE 100 MG PO CAPS: 100.0000 mg | ORAL_CAPSULE | Freq: Two times a day (BID) | ORAL | 0 refills | Status: DC

## 2021-04-08 MED ORDER — IOHEXOL 350 MG/ML SOLN
80.0000 mL | Freq: Once | INTRAVENOUS | Status: AC | PRN
  Administered 2021-04-08: 21:00:00 80 mL via INTRAVENOUS

## 2021-04-08 NOTE — ED Notes (Signed)
Patient transported to CT 

## 2021-04-08 NOTE — Telephone Encounter (Signed)
Patient stated that she has coughing up blood. Schedule her appt on 1/3. She that she stop her eliquis and started back taking it.Please advise

## 2021-04-08 NOTE — ED Triage Notes (Addendum)
C/o left sided chest pain, SOB  with bloody  pro- cough x 5 days ago pt is on eliquis , pt held eliquis for x 3 days and restarted on 4 th day

## 2021-04-08 NOTE — ED Provider Notes (Signed)
[facility] EMERGENCY DEPARTMENT Provider Note   CSN: [id number] Arrival date & time: 04/08/21  1820     History Chief Complaint  Patient presents with   Chest Pain    Hannah [name] is a with past medical history of pulmonary embolism, anticoagulated on Eliquis, HTN, HLD, CAD presents emergency department concern for chest pain or shortness of breath.  Patient states about 5 days ago she started having a productive cough, malaise, chills.  Mainly productive of phlegm but intermittently productive of blood.  Because of this she stopped her Eliquis for concern of hemorrhage.  She now presents with left-sided chest pain, shortness of breath.  She called her primary doctor who restarted her on the Eliquis but she is only had 1 dose since restarting.  Patient felt like she was feverish and chills about a week ago.  No swelling of her lower extremities.  She has otherwise been compliant with her medications.  HPI     Past Medical History:  Diagnosis Date   Acute pulmonary embolism (HCC) 07/07/2019   Chest pain with normal coronary angiography 08/09/2014   Cath 2013   Dyslipidemia 06/25/2013   Zetia added Feb 2020- PCP follows   Essential hypertension 06/25/2013   Echo June 2020- EF 50-55%, diastolic dysfunction   Family history of coronary artery disease 01/17/2019   Father and two brothers with early CAD   Family history of heart disease    Hyperlipidemia    Hypertension    Leriche syndrome (HCC)    Normocytic anemia 11/28/2016   Palpitations 06/08/2016   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 Some claudication but she declines further work up. ABis moderately decreased March 2020   Pulmonary emboli (HCC) 07/07/2019   Acute pulmonary lesion    Patient Active Problem List   Diagnosis Date Noted   Pulmonary emboli (HCC) 07/07/2019   Acute pulmonary embolism (HCC)  07/07/2019   Asymptomatic bacteriuria 07/07/2019   Normocytic anemia 11/28/2016   Palpitations 06/08/2016   Chest pain with normal coronary angiography 08/09/2014   Peripheral arterial disease (HCC) 06/25/2013   Essential hypertension 06/25/2013   Dyslipidemia 06/25/2013    Past Surgical History:  Procedure Laterality Date   ABI - BILATERAL  2008   moderate arterial insufficiency at rest; pulsatile flow of bilateral ankle PVRs   APPENDECTOMY  11/21/2002   acute appendicitis   LEFT HEART CATHETERIZATION WITH CORONARY ANGIOGRAM N/A 11/10/2011   Procedure: LEFT HEART CATHETERIZATION WITH CORONARY ANGIOGRAM;  Surgeon: Runell Gess, MD;  Location: [facility] CATH LAB;  Service: Cardiovascular;  Laterality: N/A; - normal left main/LAD/L Cfx/RCA   NM MYOCAR PERF WALL MOTION  10/2011   lexiscan - EF71%, normal perfusion; low risk scan   ROTATOR CUFF REPAIR  1995   TRANSTHORACIC ECHOCARDIOGRAM  10/2011   EF=>55%, calcified moderator band in the RV; borderline LA enlargement; mild MR; mild TR; trace AV regurg & pulm valve regurg     OB History   No obstetric history on file.     Family History  Problem Relation Age of Onset   Heart disease Father    Hypertension Father    Cancer Father    Heart attack Father        in 31s   Hypertension Brother        2 with CABG   Stroke Sister    Hypertension Sister  Heart Problems Child        born with enlarged heart    Social History   Tobacco Use   Smoking status: Former   Smokeless tobacco: Never  Substance Use Topics   Alcohol use: Yes    Comment: occasional   Drug use: No    Home Medications Prior to Admission medications   Medication Sig Start Date End Date Taking? Authorizing Provider  amLODipine (NORVASC) 2.5 MG tablet TAKE 1 TABLET(2.5 MG) BY MOUTH DAILY 02/13/21   Lorretta Harp, MD  Coenzyme Q-10 200 MG CAPS Take 200 mg by mouth daily.  Patient not taking: Reported on 03/27/2021    [provider]  DEXILANT 60  MG capsule Take 60 mg by mouth daily. 08/25/15   [provider]  diazepam (VALIUM) 5 MG tablet Take 2.5 mg by mouth daily.  06/01/13   [provider]  ELIQUIS 5 MG TABS tablet TAKE 1 TABLET BY MOUTH TWICE DAILY 10/20/20   Lorretta Harp, MD  furosemide (LASIX) 20 MG tablet Take 20 mg by mouth daily.  Patient not taking: Reported on 03/27/2021 01/04/18   [provider]  metoprolol tartrate (LOPRESSOR) 25 MG tablet TAKE 1 TABLET(25 MG) BY MOUTH TWICE DAILY 02/13/21   Lorretta Harp, MD  PRALUENT 150 MG/ML SOAJ Inject into the skin. 03/23/21   [provider]  rosuvastatin (CRESTOR) 40 MG tablet Take 40 mg by mouth daily.    [provider]  tobramycin-dexamethasone [facility]) ophthalmic ointment 1 application 3 (three) times daily. Patient not taking: Reported on 03/27/2021    [provider]  Vitamin D, Ergocalciferol, (DRISDOL) 50000 units CAPS capsule Take 50,000 Units by mouth every Wednesday.  01/11/18   [provider]    Allergies    Amoxicillin and Ramipril  Review of Systems   Review of Systems  Constitutional:  Positive for fatigue. Negative for chills and fever.  HENT:  Negative for congestion.   Eyes:  Negative for visual disturbance.  Respiratory:  Positive for cough, chest tightness and shortness of breath.   Cardiovascular:  Positive for chest pain. Negative for palpitations and leg swelling.  Gastrointestinal:  Negative for abdominal pain, diarrhea and vomiting.  Genitourinary:  Negative for dysuria and flank pain.  Musculoskeletal:  Negative for back pain.  Skin:  Negative for rash.  Neurological:  Negative for headaches.   Physical Exam Updated Vital Signs BP (!) 158/73    Pulse 62    Temp (!) 97.5 F (36.4 C)    Resp 19    Ht 5' 2.5" (1.588 m)    Wt 63 kg    SpO2 100%    BMI 25.02 kg/m   Physical Exam Vitals and nursing note reviewed.  Constitutional:      General: She is not in acute distress.     Appearance: Normal appearance.  HENT:     Head: Normocephalic.     Mouth/Throat:     Mouth: Mucous membranes are moist.  Cardiovascular:     Rate and Rhythm: Normal rate.  Pulmonary:     Effort: Pulmonary effort is normal. No respiratory distress.     Breath sounds: No wheezing or rales.  Abdominal:     Palpations: Abdomen is soft.     Tenderness: There is no abdominal tenderness.  Musculoskeletal:     Right lower leg: No edema.     Left lower leg: No edema.  Skin:    General: Skin is warm.  Neurological:  Mental Status: She is alert and oriented to person, place, and time. Mental status is at baseline.  Psychiatric:        Mood and Affect: Mood normal.    ED Results / Procedures / Treatments   Labs (all labs ordered are listed, but only abnormal results are displayed) Labs Reviewed  BASIC METABOLIC PANEL - Abnormal; Notable for the following components:      Result Value   Calcium 8.5 (*)    All other components within normal limits  CBC  TROPONIN I (HIGH SENSITIVITY)  TROPONIN I (HIGH SENSITIVITY)    EKG None  Radiology DG Chest 2 View  Result Date: 04/08/2021 CLINICAL DATA:  Chest pain. EXAM: CHEST - 2 VIEW COMPARISON:  Chest radiograph dated 12/16/2020 FINDINGS: No focal consolidation, pleural effusion, or pneumothorax. Borderline cardiomegaly. No acute osseous pathology. IMPRESSION: No active cardiopulmonary disease. Electronically Signed   By: Elgie Collard M.D.   On: 04/08/2021 19:47    Procedures Procedures   Medications Ordered in ED Medications - No data to display  ED Course  I have reviewed the triage vital signs and the nursing notes.  Pertinent labs & imaging results that were available during my care of the patient were reviewed by me and considered in my medical decision making (see chart for details).    MDM Rules/Calculators/A&P                          75 year old female presents emergency department with chest pain, shortness of  breath, hemoptysis, recently off her Eliquis due to hemoptysis for the past 5 days.  Also endorses malaise, chills.  Vitals are stable on arrival, she was tachypneic at 1 point but no tachycardia or hypoxia.  She looks comfortable in bed, lung sounds are clear.  Blood work is reassuring, hemoglobin is stable.  Troponin is negative.  Chest x-ray is unremarkable, given previous history of PE, noncompliance with Eliquis, hemoptysis CT PE study was done.  CT PE study is negative for PE or other acute finding.  Patient most likely suffering from upper respiratory infection, possible bronchitis.  Plan to treat as such and have outpatient follow-up.  Patient at this time appears safe and stable for discharge and will be treated as an outpatient.  Discharge plan and strict return to ED precautions discussed, patient verbalizes understanding and agreement.     Final Clinical Impression(s) / ED Diagnoses Final diagnoses:  None    Rx / DC Orders ED Discharge Orders     None        Rozelle Logan, DO 04/08/21 2251

## 2021-04-08 NOTE — Telephone Encounter (Signed)
Spoke with patient. She reports that 1 week ago, she coughed up blood-tinged sputum (bright red). Her PCP told her to go off Eliquis for 3 days. She had no more bloody sputum and started back on Eliquis for 2 days and had pink-tinged sputum and a bout of chest tightness. She denies bruising, night sweats, fever, or chills. She is not taking any aspirin products. She wants a chest x-ray because she thinks she might have pneumonia. Verified with pharmacist Krisitn that patient remain on Eiliquis and go to urgent care to be evaluated. Patient voiced understanding to go to an urgent care to be evaluated.

## 2021-04-08 NOTE — Discharge Instructions (Addendum)
You have been seen and discharged from the emergency department.  You most likely have an upper respiratory infection, take medications as prescribed.  Your blood work, chest x-ray and CAT scan showed no pulmonary embolism or other acute finding.  Follow-up with your primary provider for reevaluation and further care. Take home medications as prescribed. If you have any worsening symptoms or further concerns for your health please return to an emergency department for further evaluation.

## 2021-04-10 ENCOUNTER — Other Ambulatory Visit: Payer: Self-pay | Admitting: Cardiovascular Disease

## 2021-04-10 NOTE — Telephone Encounter (Signed)
Eliquis 5 mg refill request received. Patient is 75 years old, weight- 63 kg, Crea-0.93 on 04/08/21, Diagnosis- acute PE, and last seen by Dr. Allyson Sabal on 03/27/21. Dose is appropriate based on dosing criteria. Will send in refill to requested pharmacy.

## 2021-04-14 ENCOUNTER — Ambulatory Visit: Payer: Medicare Other | Admitting: Cardiovascular Disease

## 2021-08-14 ENCOUNTER — Other Ambulatory Visit: Payer: Self-pay | Admitting: Cardiovascular Disease

## 2021-10-08 ENCOUNTER — Other Ambulatory Visit: Payer: Self-pay | Admitting: Cardiovascular Disease

## 2021-10-08 NOTE — Telephone Encounter (Signed)
Prescription refill request for Eliquis received. Indication: PE Last office visit: 03/27/21  Erlene Quan MD Scr: 1.08 on 04/08/21 Age:  76 Weight: 59.3kg  Based on above findings Eliquis 5mg  twice daily is the appropriate dose.  Refill approved.

## 2022-01-11 ENCOUNTER — Encounter (HOSPITAL_BASED_OUTPATIENT_CLINIC_OR_DEPARTMENT_OTHER): Payer: Self-pay | Admitting: Pediatrics

## 2022-01-11 ENCOUNTER — Other Ambulatory Visit: Payer: Self-pay

## 2022-01-11 ENCOUNTER — Emergency Department (HOSPITAL_BASED_OUTPATIENT_CLINIC_OR_DEPARTMENT_OTHER)
Admission: EM | Admit: 2022-01-11 | Discharge: 2022-01-11 | Disposition: A | Discharge status: Home / Self Care | Payer: Medicare Other | Attending: Emergency Medicine | Admitting: Emergency Medicine

## 2022-01-11 ENCOUNTER — Emergency Department (HOSPITAL_BASED_OUTPATIENT_CLINIC_OR_DEPARTMENT_OTHER): Payer: Medicare Other

## 2022-01-11 DIAGNOSIS — M542 Cervicalgia: Secondary | ICD-10-CM | POA: Diagnosis not present

## 2022-01-11 DIAGNOSIS — R519 Headache, unspecified: Secondary | ICD-10-CM | POA: Diagnosis present

## 2022-01-11 DIAGNOSIS — R5383 Other fatigue: Secondary | ICD-10-CM | POA: Insufficient documentation

## 2022-01-11 DIAGNOSIS — R002 Palpitations: Secondary | ICD-10-CM | POA: Diagnosis not present

## 2022-01-11 DIAGNOSIS — R059 Cough, unspecified: Secondary | ICD-10-CM | POA: Diagnosis not present

## 2022-01-11 DIAGNOSIS — R42 Dizziness and giddiness: Secondary | ICD-10-CM | POA: Insufficient documentation

## 2022-01-11 DIAGNOSIS — Z7901 Long term (current) use of anticoagulants: Secondary | ICD-10-CM | POA: Diagnosis not present

## 2022-01-11 DIAGNOSIS — R531 Weakness: Secondary | ICD-10-CM | POA: Diagnosis not present

## 2022-01-11 DIAGNOSIS — I10 Essential (primary) hypertension: Secondary | ICD-10-CM | POA: Diagnosis not present

## 2022-01-11 DIAGNOSIS — Z79899 Other long term (current) drug therapy: Secondary | ICD-10-CM | POA: Diagnosis not present

## 2022-01-11 LAB — BASIC METABOLIC PANEL
Anion gap: 6 (ref 5–15)
BUN: 13 mg/dL (ref 8–23)
CO2: 26 mmol/L (ref 22–32)
Calcium: 8.4 mg/dL — ABNORMAL LOW (ref 8.9–10.3)
Chloride: 105 mmol/L (ref 98–111)
Creatinine, Ser: 0.92 mg/dL (ref 0.44–1.00)
GFR, Estimated: >60 mL/min (ref >60)
Glucose, Bld: 120 mg/dL — ABNORMAL HIGH (ref 70–99)
Potassium: 4.1 mmol/L (ref 3.5–5.1)
Sodium: 137 mmol/L (ref 135–145)

## 2022-01-11 LAB — URINALYSIS, ROUTINE W REFLEX MICROSCOPIC
Bilirubin Urine: NEGATIVE
Glucose, UA: NEGATIVE mg/dL
Ketones, ur: NEGATIVE mg/dL
Leukocytes,Ua: NEGATIVE
Nitrite: NEGATIVE
Protein, ur: NEGATIVE mg/dL
Specific Gravity, Urine: 1.01 (ref 1.005–1.030)
pH: 5.5 (ref 5.0–8.0)

## 2022-01-11 LAB — URINALYSIS, MICROSCOPIC (REFLEX): WBC, UA: NONE SEEN WBC/hpf (ref 0–5)

## 2022-01-11 LAB — CBC
HCT: 40.2 % (ref 36.0–46.0)
Hemoglobin: 13.1 g/dL (ref 12.0–15.0)
MCH: 28.6 pg (ref 26.0–34.0)
MCHC: 32.6 g/dL (ref 30.0–36.0)
MCV: 87.8 fL (ref 80.0–100.0)
Platelets: 316 10*3/uL (ref 150–400)
RBC: 4.58 MIL/uL (ref 3.87–5.11)
RDW: 13.7 % (ref 11.5–15.5)
WBC: 4.5 10*3/uL (ref 4.0–10.5)
nRBC: 0 % (ref 0.0–0.2)

## 2022-01-11 LAB — CBG MONITORING, ED: Glucose-Capillary: 114 mg/dL — ABNORMAL HIGH (ref 70–99)

## 2022-01-11 MED ORDER — ACETAMINOPHEN 325 MG PO TABS
650.0000 mg | ORAL_TABLET | Freq: Once | ORAL | Status: AC
  Administered 2022-01-11: 650 mg via ORAL
  Filled 2022-01-11: qty 2

## 2022-01-11 MED ORDER — SODIUM CHLORIDE 0.9 % IV BOLUS
1000.0000 mL | Freq: Once | INTRAVENOUS | Status: AC
  Administered 2022-01-11: 1000 mL via INTRAVENOUS

## 2022-01-11 MED ORDER — PROCHLORPERAZINE EDISYLATE 10 MG/2ML IJ SOLN
10.0000 mg | Freq: Once | INTRAMUSCULAR | Status: AC
  Administered 2022-01-11: 10 mg via INTRAVENOUS
  Filled 2022-01-11: qty 2

## 2022-01-11 MED ORDER — DIPHENHYDRAMINE HCL 50 MG/ML IJ SOLN
50.0000 mg | Freq: Once | INTRAMUSCULAR | Status: AC
  Administered 2022-01-11: 50 mg via INTRAVENOUS
  Filled 2022-01-11: qty 1

## 2022-01-11 MED ORDER — IOHEXOL 350 MG/ML SOLN
100.0000 mL | Freq: Once | INTRAVENOUS | Status: AC | PRN
  Administered 2022-01-11: 100 mL via INTRAVENOUS

## 2022-01-11 NOTE — Discharge Instructions (Signed)
Your history, exam, work-up today led [place] to do a large work-up to rule out acute stroke, vessel problems in your head, blood clots in your sinuses, and other causes of your headache.  Your work-up was overall reassuring.  As your headache is improved and your work-up did not show [place] concerning findings and you had no focal neurologic deficits that were persistent, we do feel you are safe for discharge home but I do want you to call your doctor to be seen soon and we gave you the number to 2 different neurology group to call if you need to see a headache specialist.  Please rest and stay hydrated.  If any symptoms change or worsen acutely, please return to the nearest emergency department.

## 2022-01-11 NOTE — ED Provider Notes (Signed)
[facility] EMERGENCY DEPARTMENT Provider Note   CSN: [id number] Arrival date & time: 01/11/22  1739     History  Chief Complaint  Patient presents with   Weakness   Dizziness   Headache   Palpitations    Hannah [name] is a 76 y.o. female.  The history is provided by the patient and medical records. No language interpreter was used.  Weakness Associated symptoms: cough and vision change (resolved)   Associated symptoms: no abdominal pain, no chest pain, no diarrhea, no dizziness, no dysuria, no fever, no frequency, no headaches, no myalgias, no nausea, no shortness of breath and no vomiting   Dizziness Associated symptoms: vision changes (resolved)   Associated symptoms: no chest pain, no diarrhea, no headaches, no hearing loss, no nausea, no palpitations, no shortness of breath, no vomiting and no weakness   Headache Pain location:  L parietal and occipital Quality:  Dull Radiates to:  L neck Severity currently:  8/10 Severity at highest:  10/10 Onset quality:  Gradual Duration:  6 days Timing:  Constant Progression:  Waxing and waning Chronicity:  New Similar to prior headaches: yes (worse)   Relieved by:  Nothing Worsened by:  Nothing Ineffective treatments:  None tried Associated symptoms: cough, fatigue, neck pain and visual change (resolved)   Associated symptoms: no abdominal pain, no back pain, no congestion, no diarrhea, no dizziness, no ear pain, no facial pain, no fever, no hearing loss, no myalgias, no nausea, no neck stiffness, no numbness, no photophobia, no sinus pressure, no URI, no vomiting and no weakness        Home Medications Prior to Admission medications   Medication Sig Start Date End Date Taking? Authorizing Provider  amLODipine (NORVASC) 2.5 MG tablet TAKE 1 TABLET(2.5 MG) BY MOUTH DAILY 02/13/21   Lorretta Harp, MD  benzonatate (TESSALON) 100 MG capsule Take 1 capsule (100 mg total) by mouth 2 (two) times daily as  needed for cough. 04/08/21   Horton, Alvin Critchley, DO  Coenzyme Q-10 200 MG CAPS Take 200 mg by mouth daily.  Patient not taking: Reported on 03/27/2021    [provider]  DEXILANT 60 MG capsule Take 60 mg by mouth daily. 08/25/15   [provider]  diazepam (VALIUM) 5 MG tablet Take 2.5 mg by mouth daily.  06/01/13   [provider]  doxycycline (VIBRAMYCIN) 100 MG capsule Take 1 capsule (100 mg total) by mouth 2 (two) times daily. 04/08/21   Horton, Alvin Critchley, DO  ELIQUIS 5 MG TABS tablet TAKE 1 TABLET BY MOUTH TWICE DAILY 10/08/21   Lorretta Harp, MD  furosemide (LASIX) 20 MG tablet Take 20 mg by mouth daily.  Patient not taking: Reported on 03/27/2021 01/04/18   [provider]  metoprolol tartrate (LOPRESSOR) 25 MG tablet TAKE 1 TABLET(25 MG) BY MOUTH TWICE DAILY 08/14/21   Lorretta Harp, MD  PRALUENT 150 MG/ML SOAJ Inject into the skin. 03/23/21   [provider]  rosuvastatin (CRESTOR) 40 MG tablet Take 40 mg by mouth daily.    [provider]  tobramycin-dexamethasone [facility]) ophthalmic ointment 1 application 3 (three) times daily. Patient not taking: Reported on 03/27/2021    [provider]  Vitamin D, Ergocalciferol, (DRISDOL) 50000 units CAPS capsule Take 50,000 Units by mouth every Wednesday.  01/11/18   [provider]      Allergies    Amoxicillin and Ramipril    Review of Systems   Review of Systems  Constitutional:  Positive for fatigue. Negative for appetite change, chills, diaphoresis and fever.  HENT:  Negative for congestion, ear pain, hearing loss and sinus pressure.   Eyes:  Negative for photophobia.  Respiratory:  Positive for cough. Negative for chest tightness, shortness of breath and wheezing.   Cardiovascular:  Negative for chest pain, palpitations and leg swelling.  Gastrointestinal:  Negative for abdominal pain, constipation, diarrhea, nausea and vomiting.  Genitourinary:  Negative for  dysuria, flank pain and frequency.  Musculoskeletal:  Positive for neck pain. Negative for back pain, myalgias and neck stiffness.  Skin:  Negative for rash and wound.  Neurological:  Positive for light-headedness. Negative for dizziness, speech difficulty, weakness, numbness and headaches.  Psychiatric/Behavioral:  Negative for agitation and confusion.   All other systems reviewed and are negative.   Physical Exam Updated Vital Signs BP (!) 143/70 (BP Location: Right Arm)   Pulse 79   Temp 97.9 F (36.6 C) (Oral)   Resp 18   Ht 5\' 2"  (1.575 m)   Wt 58.5 kg   SpO2 100%   BMI 23.59 kg/m  Physical Exam Vitals and nursing note reviewed.  Constitutional:      General: She is not in acute distress.    Appearance: She is well-developed. She is not ill-appearing, toxic-appearing or diaphoretic.  HENT:     Head: Normocephalic and atraumatic.     Nose: No congestion or rhinorrhea.     Mouth/Throat:     Mouth: Mucous membranes are moist.     Pharynx: No oropharyngeal exudate.  Eyes:     Extraocular Movements: Extraocular movements intact.     Right eye: Normal extraocular motion.     Left eye: Normal extraocular motion.     Conjunctiva/sclera: Conjunctivae normal.     Pupils: Pupils are equal, round, and reactive to light. Pupils are equal.  Neck:     Vascular: No carotid bruit.  Cardiovascular:     Rate and Rhythm: Normal rate and regular rhythm.     Pulses: Normal pulses.  Pulmonary:     Effort: Pulmonary effort is normal. No respiratory distress.     Breath sounds: Normal breath sounds. No wheezing, rhonchi or rales.  Chest:     Chest wall: No tenderness.  Abdominal:     General: Abdomen is flat.     Palpations: Abdomen is soft.     Tenderness: There is no abdominal tenderness. There is no right CVA tenderness, left CVA tenderness, guarding or rebound.  Musculoskeletal:        General: No swelling, tenderness or signs of injury.     Cervical back: Normal range of motion  and neck supple. No tenderness.     Right lower leg: No edema.     Left lower leg: No edema.  Skin:    General: Skin is warm and dry.     Capillary Refill: Capillary refill takes less than 2 seconds.     Findings: No erythema or rash.  Neurological:     General: No focal deficit present.     Mental Status: She is alert.     Sensory: No sensory deficit.     Motor: No weakness.     Coordination: Coordination normal.  Psychiatric:        Mood and Affect: Mood normal.     ED Results / Procedures / Treatments   Labs (all labs ordered are listed, but only abnormal results are displayed) Labs Reviewed  BASIC METABOLIC PANEL - Abnormal; Notable  for the following components:      Result Value   Glucose, Bld 120 (*)    Calcium 8.4 (*)    All other components within normal limits  URINALYSIS, ROUTINE W REFLEX MICROSCOPIC - Abnormal; Notable for the following components:   Hgb urine dipstick TRACE (*)    All other components within normal limits  URINALYSIS, MICROSCOPIC (REFLEX) - Abnormal; Notable for the following components:   Bacteria, UA RARE (*)    All other components within normal limits  CBG MONITORING, ED - Abnormal; Notable for the following components:   Glucose-Capillary 114 (*)    All other components within normal limits  CBC    EKG EKG Interpretation  Date/Time:  Monday January 11 2022 17:53:28 EDT Ventricular Rate:  70 PR Interval:  164 QRS Duration: 66 QT Interval:  394 QTC Calculation: 425 R Axis:   43 Text Interpretation: Normal sinus rhythm Low voltage QRS Cannot rule out Anterior infarct , age undetermined Abnormal ECG When compared with ECG of 08-Apr-2021 18:30, PREVIOUS ECG IS PRESENT when compared to prior, less artifact but otherwise similar. No STEMI Confirmed by Antony Blackbird [phone]) on 01/11/2022 6:14:35 PM  Radiology CT VENOGRAM HEAD  Result Date: 01/11/2022 CLINICAL DATA:  Headache EXAM: CT VENOGRAM HEAD TECHNIQUE: Venographic phase images of  the brain were obtained following the administration of intravenous contrast. Multiplanar reformats and maximum intensity projections were generated. RADIATION DOSE REDUCTION: This exam was performed according to the departmental dose-optimization program which includes automated exposure control, adjustment of the mA and/or kV according to patient size and/or use of iterative reconstruction technique. CONTRAST:  133mL OMNIPAQUE IOHEXOL 350 MG/ML SOLN COMPARISON:  None Available. FINDINGS: Superior sagittal sinus: Normal. Straight sinus: Normal. Inferior sagittal sinus, vein of Galen and internal cerebral veins: Normal. Transverse sinuses: Normal. Sigmoid sinuses: Normal. Visualized jugular veins: Normal. IMPRESSION: No evidence of dural venous sinus thrombosis. Electronically Signed   By: Ulyses Jarred M.D.   On: 01/11/2022 20:52   CT ANGIO HEAD NECK W WO CM  Result Date: 01/11/2022 CLINICAL DATA:  Atypical headache EXAM: CT ANGIOGRAPHY HEAD AND NECK TECHNIQUE: Multidetector CT imaging of the head and neck was performed using the standard protocol during bolus administration of intravenous contrast. Multiplanar CT image reconstructions and MIPs were obtained to evaluate the vascular anatomy. Carotid stenosis measurements (when applicable) are obtained utilizing NASCET criteria, using the distal internal carotid diameter as the denominator. RADIATION DOSE REDUCTION: This exam was performed according to the departmental dose-optimization program which includes automated exposure control, adjustment of the mA and/or kV according to patient size and/or use of iterative reconstruction technique. CONTRAST:  184mL OMNIPAQUE IOHEXOL 350 MG/ML SOLN COMPARISON:  None Available. FINDINGS: CT HEAD FINDINGS Brain: There is no mass, hemorrhage or extra-axial collection. The size and configuration of the ventricles and extra-axial CSF spaces are normal. There is no acute or chronic infarction. The brain parenchyma is normal.  Skull: The visualized skull base, calvarium and extracranial soft tissues are normal. Sinuses/Orbits: No fluid levels or advanced mucosal thickening of the visualized paranasal sinuses. No mastoid or middle ear effusion. The orbits are normal. CTA NECK FINDINGS SKELETON: There is no bony spinal canal stenosis. No lytic or blastic lesion. OTHER NECK: Normal pharynx, larynx and major salivary glands. No cervical lymphadenopathy. Low-density right thyroid nodules measuring up to 10 mm. UPPER CHEST: No pneumothorax or pleural effusion. No nodules or masses. AORTIC ARCH: There is no calcific atherosclerosis of the aortic arch. There is no aneurysm, dissection  or hemodynamically significant stenosis of the visualized portion of the aorta. Conventional 3 vessel aortic branching pattern. The visualized proximal subclavian arteries are widely patent. RIGHT CAROTID SYSTEM: Normal without aneurysm, dissection or stenosis. LEFT CAROTID SYSTEM: Normal without aneurysm, dissection or stenosis. VERTEBRAL ARTERIES: Left dominant configuration. Both origins are clearly patent. There is no dissection, occlusion or flow-limiting stenosis to the skull base (V1-V3 segments). CTA HEAD FINDINGS POSTERIOR CIRCULATION: --Vertebral arteries: Normal V4 segments. --Inferior cerebellar arteries: Normal. --Basilar artery: Normal. --Superior cerebellar arteries: Normal. --Posterior cerebral arteries (PCA): Normal. ANTERIOR CIRCULATION: --Intracranial internal carotid arteries: Normal. --Anterior cerebral arteries (ACA): Normal. Both A1 segments are present. Patent anterior communicating artery (a-comm). --Middle cerebral arteries (MCA): Normal. VENOUS SINUSES: As permitted by contrast timing, patent. ANATOMIC VARIANTS: None Review of the MIP images confirms the above findings. IMPRESSION: 1. No emergent large vessel occlusion or high-grade stenosis of the intracranial arteries. 2. A 1 cm incidental right thyroid nodule. No follow-up imaging is  recommended. Reference: J Am Coll Radiol. 2015 Feb;12(2): 143-50 Electronically Signed   By: Ulyses Jarred M.D.   On: 01/11/2022 20:50   DG Chest 2 View  Result Date: 01/11/2022 CLINICAL DATA:  Weakness with dizziness and palpitations. EXAM: CHEST - 2 VIEW COMPARISON:  April 08, 2021 FINDINGS: The heart size and mediastinal contours are within normal limits. There is mild tortuosity of the descending thoracic aorta. Both lungs are clear. The visualized skeletal structures are unremarkable. IMPRESSION: No active cardiopulmonary disease. Electronically Signed   By: Virgina Norfolk M.D.   On: 01/11/2022 20:12    Procedures Procedures    Medications Ordered in ED Medications  prochlorperazine (COMPAZINE) injection 10 mg (10 mg Intravenous Given 01/11/22 1909)  diphenhydrAMINE (BENADRYL) injection 50 mg (50 mg Intravenous Given 01/11/22 1907)  sodium chloride 0.9 % bolus 1,000 mL (1,000 mLs Intravenous New Bag/Given 01/11/22 1904)  iohexol (OMNIPAQUE) 350 MG/ML injection 100 mL (100 mLs Intravenous Contrast Given 01/11/22 1927)    ED Course/ Medical Decision Making/ A&P                           Medical Decision Making Amount and/or Complexity of Data Reviewed Labs: ordered. Radiology: ordered.  Risk Prescription drug management.    Hannah [name] is a 76 y.o. female with a past medical history significant for previous pulmonary emboli on Eliquis therapy, hypertension, dyslipidemia, and significant peripheral arterial disease with Leriche syndrome who presents with 6 days of pain on the top of her head going to her left head, left neck, and having associated intermittent blurry vision, lightheadedness, and fatigue.  According to patient, for the last 6 days or so she has had pain that started on the top of her head and then distended down the left side of her head towards her neck.  She reports it does not go further down.  She reports some mild cough and productive phlegm but  otherwise no chest pain or shortness of breath reported.  She denies any arm or leg numbness weakness or symptoms.  She denies any numbness or droopiness to her face.  She reports her vision went slightly blurry bilaterally when she was feeling lightheaded but otherwise it has not had any vision abnormalities.  She is reporting the pain gets up to a 9 out of 10 in severity and is currently severe.  She denies any fevers, chills, congestion, or other URI symptoms aside from that one episode of coughing.  She denies  any speech difficulties.  Denies any true room spinning dizziness but does report that brief lightheadedness when her headache was severe.  She has a history of clotting troubles with previous pulmonary emboli and arterial clotting.  She denies history of stroke.  She denies history of dural venous sinus thrombus.  She reports she has had a history of headaches but this feels slightly different than the ones in the past.  On my exam, lungs were clear.  Chest was nontender.  I cannot appreciate a carotid bruit.  She had intact sensation, strength, and pulses in extremities.  Normal finger-nose-finger testing.  No focal neurologic deficit initially.  Pupils are symmetric and reactive normal extraocular movements.  Patient had clear speech.  Neck was nontender and she could move her neck in all directions.  Otherwise exam reassuring.  Clinically given her history of clotting troubles I am somewhat concerning to rule out a dural venous sinus thrombus given the length and atypical nature of her headache with these intermittent vision changes.  I do feel we also need CT of the head and neck to rule out a arterial vascular problem she has had in the past with different clot troubles.  We will get the CTA to rule out dissection bleed, or other trouble.  We will give her headache cocktail to try to help her symptoms initially and we will get some screening labs.  We will get chest x-ray given the cough.  If  work-up is reassuring, anticipate discharge home and PCP follow-up.  11:25 PM Work-up returned overall reassuring.  CTs of the head did not show evidence of sinus thrombus, stroke, or acute vascular abnormality.  She reports her headache had improved after medications.  Other work-up did not show critical findings.  Patient will follow-up with PCP and we gave her number for neurology as well and she will rest and stay hydrated.  I suspect evolution of chronic headaches causing her new symptoms.  She agreed with plan of care and had no other questions or concerns.  Patient discharged in good condition with improving symptoms.        Final Clinical Impression(s) / ED Diagnoses Final diagnoses:  Acute nonintractable headache, unspecified headache type    Rx / DC Orders ED Discharge Orders     None       Clinical Impression: 1. Acute nonintractable headache, unspecified headache type     Disposition: Discharge  Condition: Good  I have discussed the results, Dx and Tx plan with the pt(& family if present). He/she/they expressed understanding and agree(s) with the plan. Discharge instructions discussed at great length. Strict return precautions discussed and pt &/or family have verbalized understanding of the instructions. No further questions at time of discharge.    New Prescriptions   No medications on file    Follow Up: Jilda Panda, MD [id number] [place] [id number] [phone]     [place] [address]     [place] [phone] [place] [address] [place], [place] [phone]        Piotr Kenedee Molesky, Gwenyth Allegra, MD 01/11/22 [phone]

## 2022-01-11 NOTE — ED Triage Notes (Signed)
C/O pain on top of head radiating to left side of face and neck x 6 days; c/o weakness, dizziness and palpitations; reported on blood thinners (Eliquis) due to PE.

## 2022-01-20 ENCOUNTER — Ambulatory Visit: Payer: Medicare Other | Admitting: Neurology

## 2022-02-11 ENCOUNTER — Other Ambulatory Visit: Payer: Self-pay | Admitting: Cardiovascular Disease

## 2022-03-01 ENCOUNTER — Ambulatory Visit (INDEPENDENT_AMBULATORY_CARE_PROVIDER_SITE_OTHER): Payer: Medicare Other | Admitting: Family Medicine

## 2022-03-01 VITALS — BP 118/58 | Ht 62.0 in | Wt 129.0 lb

## 2022-03-01 DIAGNOSIS — S8991XA Unspecified injury of right lower leg, initial encounter: Secondary | ICD-10-CM

## 2022-03-01 NOTE — Progress Notes (Unsigned)
  SUBJECTIVE:   CHIEF COMPLAINT / HPI:   Ms. Kittle is a 76 year old presenting with shin pain. She tripped over chair in kitchen about 5 days ago and shin landed on chair. Her pain is burning pain present to front of right shin. There was a knot of shin immediately after fall, but this has improved with ice and tylenol. She is able to walk with pain in shin. Denies knee pain   PERTINENT  PMH / PSH: peripheral arterial disease  Past Medical History:  Diagnosis Date   Acute pulmonary embolism (HCC) 07/07/2019   Chest pain with normal coronary angiography 08/09/2014   Cath 2013   Dyslipidemia 06/25/2013   Zetia added Feb 2020- PCP follows   Essential hypertension 06/25/2013   Echo June 2020- EF 50-55%, diastolic dysfunction   Family history of coronary artery disease 01/17/2019   Father and two brothers with early CAD   Family history of heart disease    Hyperlipidemia    Hypertension    Leriche syndrome (HCC)    Normocytic anemia 11/28/2016   Palpitations 06/08/2016   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 Some claudication but she declines further work up. ABis moderately decreased March 2020   Pulmonary emboli (HCC) 07/07/2019   Acute pulmonary lesion    OBJECTIVE:  BP (!) 118/58   Ht 5\' 2"  (1.575 m)   Wt 129 lb (58.5 kg)   BMI 23.59 kg/m  Constitutional: well-appearing  MSK:  Right shin- Mild soft tissue swelling, ecchymosis present to right shin, TTP over midshaft of tibia. Full range of motion of knee and ankle without pain, no pain at jointline of knee Negative anterior drawer Negative talar tilt  U/S right shin: No fracture of anterior tibia; compressible popliteal vein  ASSESSMENT/PLAN:  Right anterior tibia pain 2/2 to soft tissue injury- Ice, tylenol, compression socks if able to tolerate, elevation.  Adalene Gulotta M. Linna Thebeau, D.O.  Internal Medicine Resident, PGY-2 Internal Medicine Residency  Pager: [phone]  Addendum:  Patient seen and examined with resident.  Agree with her note and findings.  Patient's exam, ultrasound reassuring.  Note edited above.

## 2022-03-02 ENCOUNTER — Encounter: Payer: Self-pay | Admitting: Family Medicine

## 2022-03-09 ENCOUNTER — Other Ambulatory Visit: Payer: Self-pay | Admitting: Internal Medicine

## 2022-03-09 ENCOUNTER — Ambulatory Visit
Admission: RE | Admit: 2022-03-09 | Discharge: 2022-03-09 | Disposition: A | Discharge status: Home / Self Care | Payer: Medicare Other | Source: Ambulatory Visit | Attending: Internal Medicine | Admitting: Internal Medicine

## 2022-03-09 DIAGNOSIS — M79604 Pain in right leg: Secondary | ICD-10-CM

## 2022-03-19 ENCOUNTER — Other Ambulatory Visit: Payer: Self-pay | Admitting: Internal Medicine

## 2022-03-19 DIAGNOSIS — Z1231 Encounter for screening mammogram for malignant neoplasm of breast: Secondary | ICD-10-CM

## 2022-03-22 ENCOUNTER — Telehealth: Payer: Self-pay | Admitting: Cardiovascular Disease

## 2022-03-22 NOTE — Telephone Encounter (Signed)
Pt c/o Shortness Of Breath: STAT if SOB developed within the last 24 hours or pt is noticeably SOB on the phone  1. Are you currently SOB (can you hear that pt is SOB on the phone)?  No  2. How long have you been experiencing SOB?  Started last week  3. Are you SOB when sitting or when up moving around?  Both, but mainly when up and moving around  4. Are you currently experiencing any other symptoms?  Not currently--Chest pains, palpitations, elevated HR 99-100 bpm (comes down a little after taking metoprolol)

## 2022-03-22 NOTE — Telephone Encounter (Signed)
Patient reported having palpitations that she feels under her left ribcage and from the neck down to her abdomen for 1 week. She has SOB at rest. She is staying hydrated, avoiding caffeine, occasional ETOH, she has chest congestion, but doesn't take anything for it. While on phone, 136/69, 91, sat 98%. She is taking prescribed medications. Made appointment with Brunetta Genera for 12/14. Recommended that if she feels worse, to have someone take her to the ED or call 911. She verbalized understanding.

## 2022-03-22 NOTE — Telephone Encounter (Signed)
Agree with plan for clinic visit. Last OV one year ago.   Alver Sorrow, NP

## 2022-03-25 ENCOUNTER — Encounter (HOSPITAL_BASED_OUTPATIENT_CLINIC_OR_DEPARTMENT_OTHER): Payer: Self-pay | Admitting: Family

## 2022-03-25 ENCOUNTER — Ambulatory Visit (INDEPENDENT_AMBULATORY_CARE_PROVIDER_SITE_OTHER): Payer: Medicare Other | Admitting: Family

## 2022-03-25 ENCOUNTER — Other Ambulatory Visit (INDEPENDENT_AMBULATORY_CARE_PROVIDER_SITE_OTHER): Payer: Medicare Other

## 2022-03-25 VITALS — BP 156/76 | HR 71 | Ht 62.0 in | Wt 124.6 lb

## 2022-03-25 DIAGNOSIS — I10 Essential (primary) hypertension: Secondary | ICD-10-CM | POA: Diagnosis not present

## 2022-03-25 DIAGNOSIS — Z86711 Personal history of pulmonary embolism: Secondary | ICD-10-CM

## 2022-03-25 DIAGNOSIS — R002 Palpitations: Secondary | ICD-10-CM | POA: Diagnosis not present

## 2022-03-25 DIAGNOSIS — D6859 Other primary thrombophilia: Secondary | ICD-10-CM | POA: Diagnosis not present

## 2022-03-25 DIAGNOSIS — R0609 Other forms of dyspnea: Secondary | ICD-10-CM | POA: Diagnosis not present

## 2022-03-25 MED ORDER — METOPROLOL TARTRATE 25 MG PO TABS: 37.5000 mg | ORAL_TABLET | Freq: Two times a day (BID) | ORAL | 1 refills | Status: DC

## 2022-03-25 NOTE — Patient Instructions (Addendum)
Medication Instructions:  Your physician has recommended you make the following change in your medication:   INCREASE Metoprolol Tartrate to 1.5 tablets (37.5mg ) twice daily  May take over the counter Muccinex (not the DM version) for congestion   *If you need a refill on your cardiac medications before your next appointment, please call your pharmacy*   Lab Work: Your physician recommends that you return for lab work today: CMP, TSH, CBC, magnesium  If you have labs (blood work) drawn today and your tests are completely normal, you will receive your results only by: [place] (if you have [place]) OR A paper copy in the mail If you have any lab test that is abnormal or we need to change your treatment, we will call you to review the results.   Testing/Procedures: Your EKG today showed normal sinus rhythm which is a good result!  Your physician has recommended that you wear a Zio monitor.   This monitor is a medical device that records the heart's electrical activity. Doctors most often use these monitors to diagnose arrhythmias. Arrhythmias are problems with the speed or rhythm of the heartbeat. The monitor is a small device applied to your chest. You can wear one while you do your normal daily activities. While wearing this monitor if you have any symptoms to push the button and record what you felt. Once you have worn this monitor for the period of time provider prescribed (for 3-7 days), you will return the monitor device in the postage paid box. Once it is returned they will download the data collected and provide [place] with a report which the provider will then review and we will call you with those results. Important tips:  Avoid showering during the first 24 hours of wearing the monitor. Avoid excessive sweating to help maximize wear time. Do not submerge the device, no hot tubs, and no swimming pools. Keep any lotions or oils away from the patch. After 24 hours you may  shower with the patch on. Take brief showers with your back facing the shower head.  Do not remove patch once it has been placed because that will interrupt data and decrease adhesive wear time. Push the button when you have any symptoms and write down what you were feeling. Once you have completed wearing your monitor, remove and place into box which has postage paid and place in your outgoing mailbox.  If for some reason you have misplaced your box then call our office and we can provide another box and/or mail it off for you.  Follow-Up: At [facility], you and your health needs are our priority.  As part of our continuing mission to provide you with exceptional heart care, we have created designated Provider Care Teams.  These Care Teams include your primary Cardiologist (physician) and Advanced Practice Providers (APPs -  Physician Assistants and Nurse Practitioners) who all work together to provide you with the care you need, when you need it.  We recommend signing up for the patient portal called "MyChart".  Sign up information is provided on this After Visit Summary.  MyChart is used to connect with patients for Virtual Visits (Telemedicine).  Patients are able to view lab/test results, encounter notes, upcoming appointments, etc.  Non-urgent messages can be sent to your provider as well.   To learn more about what you can do with MyChart, go to [email or web address].    Your next appointment:   As scheduled with Dr. Gwenlyn Found  Other Instructions To  prevent palpitations: Make sure you are adequately hydrated.  Avoid and/or limit caffeine containing beverages like soda or tea. Exercise regularly.  Manage stress well. Some over the counter medications can cause palpitations such as Benadryl, AdvilPM, TylenolPM. Regular Advil or Tylenol do not cause palpitations.    Hypotension As your heart beats, it forces blood through your body. This force is called blood pressure. If you  have hypotension, you have low blood pressure.  When your blood pressure is too low, you may not get enough blood to your brain or other parts of your body. This may cause you to feel weak, light-headed, have a fast heartbeat, or even faint. Low blood pressure may be harmless, or it may cause serious problems. What are the causes? Blood loss. Not enough water in the body (dehydration). Heart problems. Hormone problems. Pregnancy. A very bad infection. Not having enough of certain nutrients. Very bad allergic reactions. Certain medicines. What increases the risk? Age. The risk increases as you get older. Conditions that affect the heart or the brain and spinal cord (central nervous system). What are the signs or symptoms? Feeling: Weak. Light-headed. Dizzy. Tired (fatigued). Blurred vision. Fast heartbeat. Fainting, in very bad cases. How is this treated? Changing your diet. This may involve drinking more water or including more salt (sodium) in your diet by eating high-salt foods. Taking medicines to raise your blood pressure. Changing how much you take (the dosage) of some of your medicines. Wearing compression stockings. These stockings help to prevent blood clots and reduce swelling in your legs. In some cases, you may need to go to the hospital to: Receive fluids through an IV tube. Receive donated blood through an IV tube (transfusion). Get treated for an infection or heart problems, if this applies. Be monitored while medicines that you are taking wear off. Follow these instructions at home: Eating and drinking  Drink enough fluids to keep your pee (urine) pale yellow. Eat a healthy diet. Follow instructions from your doctor about what you can eat or drink. A healthy diet includes: Fresh fruits and vegetables. Whole grains. Low-fat (lean) meats. Low-fat dairy products. If told, include more salt in your diet. Do not add extra salt to your diet unless your doctor tells  you to. Eat small meals often. Avoid standing up quickly after you eat. Medicines Take over-the-counter and prescription medicines only as told by your doctor. Follow instructions from your doctor about changing how much you take of your medicines, if this applies. Do not stop or change any of your medicines on your own. General instructions  Wear compression stockings as told by your doctor. Get up slowly from lying down or sitting. Avoid hot showers and a lot of heat as told by your doctor. Return to your normal activities when your doctor says that it is safe. Do not smoke or use any products that contain nicotine or tobacco. If you need help quitting, ask your doctor. Keep all follow-up visits. Contact a doctor if: You vomit. You have watery poop (diarrhea). You have a fever for more than 2-3 days. You feel more thirsty than normal. You feel weak and tired. Get help right away if: You have chest pain. You have a fast or uneven heartbeat. You lose feeling (have numbness) in any part of your body. You cannot move your arms or your legs. You have trouble talking. You get sweaty or feel light-headed. You faint. You have trouble breathing. You have trouble staying awake. You feel mixed up (  confused). These symptoms may be an emergency. Get help right away. Call 911. Do not wait to see if the symptoms will go away. Do not drive yourself to the hospital. Summary Hypotension is also called low blood pressure. It is when the force of blood pumping through your body is too weak. Hypotension may be harmless, or it may cause serious problems. Treatment may include changing your diet and medicines, and wearing compression stockings. In very bad cases, you may need to go to the hospital. This information is not intended to replace advice given to you by your health care provider. Make sure you discuss any questions you have with your health care provider. Document Revised: 11/17/2020  Document Reviewed: 11/17/2020 Elsevier Patient Education  [place].

## 2022-03-25 NOTE — Progress Notes (Signed)
Office Visit    Patient Name: Hannah [name] Date of Encounter: 03/25/2022  PCP:  Jilda Panda, [facility]  Cardiologist:  Quay Burow, MD  Advanced Practice Provider:  No care team member to display Electrophysiologist:  None      Chief Complaint    Hannah [name] is a 76 y.o. 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 Some claudication but she declines further work up. ABis moderately decreased March 2020   Pulmonary emboli ([place]) 07/07/2019   Acute pulmonary lesion   Past Surgical History:  Procedure Laterality Date   ABI - BILATERAL  2008   moderate arterial insufficiency at rest; pulsatile flow of bilateral ankle PVRs   APPENDECTOMY  11/21/2002   acute appendicitis   LEFT HEART CATHETERIZATION WITH CORONARY ANGIOGRAM N/A 11/10/2011   Procedure: LEFT HEART CATHETERIZATION WITH CORONARY ANGIOGRAM;  Surgeon: Lorretta Harp, MD;  Location: [facility] CATH LAB;  Service: Cardiovascular;  Laterality: N/A; - normal left main/LAD/L Cfx/RCA   NM MYOCAR PERF WALL MOTION  10/2011   lexiscan - EF71%, normal perfusion; low risk scan   ROTATOR CUFF REPAIR  1995   TRANSTHORACIC ECHOCARDIOGRAM  10/2011   EF=>55%, calcified moderator band in the  RV; borderline LA enlargement; mild MR; mild TR; trace AV regurg & pulm valve regurg    Allergies  Allergies  Allergen Reactions   Amoxicillin Swelling   Ramipril Swelling    Angioedema     History of Present Illness    Hannah [name] is a 76 y.o. female with a hx of PAD, HTN, DLD, chest pain with normal angiography 2013, PE last seen 03/27/21 by Dr. Gwenlyn Found.  History of normal coronary arteries by prior catheterization in 2013.  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 Echo 09/2018 LVEF 50-55%, impaired diastolic relaxation, RV normal, mild thickening of mitral valve.  She was admitted 06/2019 for chest pain and shortness of breath and found to have PE placed on Eliquis.  She was last seen 03/27/2021 noting mild lifestyle limiting claudication but did not wish to pursue surgical revascularization.  She also noted occasional morning palpitations that was unchanged from prior monitor 07/2018 which revealed episodes of nonsustained VT and PSVT.  Her same doses of metoprolol, Eliquis were continued.  She presents today after contacting the office requesting an appointment.  She has a myriad of concerns.  All symptoms onset over the last 10 days.  Notes palpitations under her left armpit and in her mid abdomen described as racing and beating hard.  Heart rate at home as high as the  100s.  Notes lightheadedness with near syncope.  No true syncope.  Notes exertional dyspnea.  Oxygen level consistently more than 93%. Notes orthopnea but no PND. No edema. She has been having difficulty sleeping. Also notes nausea. She has been drinking a lot of water per her report but only eating a small meal around 2 PM and then again in the evening such as peanut butter crackers or an egg. Notes all over "weakness" throughout her whole body. Notes some midsternal chest discomfort  described as tightness and pressure both at rest and with activity. Also note a "throbbing" in her left breast which is sore when she presses on it. She has not missed any doses of her Eliquis. No blood in her urine but does note green colored stool. Also notes constant mucus in her chest.   EKGs/Labs/Other Studies Reviewed:   The following studies were reviewed today:  Echo 09/2018  1. The left ventricle has low normal systolic function, with an ejection  fraction of 50-55%. The cavity size was normal. Left ventricular diastolic  Doppler parameters are consistent with impaired relaxation. Left  ventricular diffuse hypokinesis.   2. The right ventricle has normal systolic function. The cavity was  normal. There is no increase in right ventricular wall thickness.   3. The mitral valve is degenerative. Mild thickening of the mitral valve  leaflet.    EKG:  EKG is ordered today.  The ekg ordered today demonstrates NSR 71 bpm with no acute ST/T wave changes.   Recent Labs: 01/11/2022: BUN 13; Creatinine, Ser 0.92; Hemoglobin 13.1; Platelets 316; Potassium 4.1; Sodium 137  Recent Lipid Panel No results found for: "CHOL", "TRIG", "HDL", "CHOLHDL", "VLDL", "LDLCALC", "LDLDIRECT"   Home Medications   Current Meds  Medication Sig   amLODipine (NORVASC) 10 MG tablet Take 10 mg by mouth daily.   baclofen (LIORESAL) 10 MG tablet Take 10 mg by mouth as needed for muscle spasms.   Coenzyme Q-10 200 MG CAPS Take 200 mg by mouth daily.   DEXILANT 60 MG capsule Take 60 mg by mouth daily.   diazepam (VALIUM) 5 MG tablet Take 2.5 mg by mouth daily.    ELIQUIS 5 MG TABS tablet TAKE 1 TABLET BY MOUTH TWICE DAILY   gabapentin (NEURONTIN) 100 MG capsule Take 100 mg by mouth 3 (three) times daily.   PRALUENT 150 MG/ML SOAJ Inject into the skin.   rosuvastatin (CRESTOR) 40 MG tablet Take 40 mg by mouth daily.   Vitamin D, Ergocalciferol, (DRISDOL) 50000 units CAPS capsule Take 50,000 Units by mouth every  Wednesday.    [DISCONTINUED] metoprolol tartrate (LOPRESSOR) 25 MG tablet TAKE 1 TABLET(25 MG) BY MOUTH TWICE DAILY    Review of Systems      All other systems reviewed and are otherwise negative except as noted above.  Physical Exam    VS:  BP (!) 156/76 (BP Location: Left Arm, Patient Position: Sitting, Cuff Size: Normal)   Pulse 71   Ht _0  (1.575 m)   Wt 124 lb 9.6 oz (56.5 kg)   BMI 22.79 kg/m  , BMI Body mass index is 22.79 kg/m.  Wt Readings from Last 3 Encounters:  03/25/22 124 lb 9.6 oz (56.5 kg)  03/01/22 129 lb (58.5 kg)  01/11/22 129 lb (58.5 kg)    GEN: Well nourished, well developed, in no acute distress. HEENT: normal. Neck: Supple, no JVD, carotid bruits, or masses. Cardiac: RRR, no murmurs, rubs, or gallops. No clubbing, cyanosis, edema.  Radials/PT  2+ and equal bilaterally.  Respiratory:  Respirations regular and unlabored, clear to auscultation bilaterally. GI: Soft, nontender, nondistended. MS: No deformity or atrophy. Skin: Warm and dry, no rash. Neuro:  Strength and sensation are intact. Psych: Normal affect.  Anxious  Assessment & Plan    Palpitations / Dyspnea - 10 day history of palpitations and exertional dyspnea. Describes as heart racing under her left armpit as well as in her abdomen.  She endorses overall feeling not well over the last 10 days.  Update labs including CBC, CMP, magnesium, TSH.  Will increase her metoprolol tartrate from 25 mg to 37.5 mg twice daily.  7-day ZIO monitor placed in clinic.  If the after mentioned workup is unrevealing and she is still with symptoms could consider echocardiogram.   Chest pain -at rest and with activity. Does not improve with rest.  Prior LHC with no ischemia.  EKG today no acute ST/T wave changes.  Her symptoms are overall atypical as they occur at rest and often at time of her palpitations. Palpitations workup as above.  No indication for ischemic evaluation at this time.  Nausea /congestion /green  stool -She has primary care appointment next week and was encouraged to discuss these concerns. No known sick contacts. We discussed that she could take over-the-counter Mucinex as needed for congestion.  Hx of PE - No missed doses of Eliquis. Update CBC to rule out anemia as she notes green stools.  PAD - No acute claudication symptoms. No abdominal pain after meals. Upcoming follow up with Dr. Gwenlyn Found.  Orthostatic hypotension / HTN -she was markedly orthostatic in clinic.  Likely exacerbated by nausea and poor p.o. intake. Education provided on orthostatic precautions: Stay well hydrated, eat regular meals, wear compression socks, make position changes slowly. CBC, BMP, magnesium to ensure no electrolyte abnormality or anemia contributory.  Orthostatic VS for the past 24 hrs (Last 3 readings):  BP- Lying Pulse- Lying BP- Sitting Pulse- Sitting BP- Standing at 0 minutes Pulse- Standing at 0 minutes BP- Standing at 3 minutes Pulse- Standing at 3 minutes  03/25/22 1448 198/81 70 166/74 82 127/75 82 105/69 84     Disposition: Follow up  as scheduled  with Quay Burow, MD or APP.  Signed, Loel Dubonnet, NP 03/25/2022, 5:03 PM [place]

## 2022-03-26 LAB — COMPREHENSIVE METABOLIC PANEL
ALT: 10 IU/L (ref 0–32)
AST: 16 IU/L (ref 0–40)
Albumin/Globulin Ratio: 1.8 (ref 1.2–2.2)
Albumin: 4.4 g/dL (ref 3.8–4.8)
Alkaline Phosphatase: 112 IU/L (ref 44–121)
BUN/Creatinine Ratio: 13 (ref 12–28)
BUN: 12 mg/dL (ref 8–27)
Bilirubin Total: 0.4 mg/dL (ref 0.0–1.2)
CO2: 24 mmol/L (ref 20–29)
Calcium: 9.3 mg/dL (ref 8.7–10.3)
Chloride: 104 mmol/L (ref 96–106)
Creatinine, Ser: 0.92 mg/dL (ref 0.57–1.00)
Globulin, Total: 2.5 g/dL (ref 1.5–4.5)
Glucose: 118 mg/dL — ABNORMAL HIGH (ref 70–99)
Potassium: 3.6 mmol/L (ref 3.5–5.2)
Sodium: 142 mmol/L (ref 134–144)
Total Protein: 6.9 g/dL (ref 6.0–8.5)
eGFR: 65 mL/min/{1.73_m2} (ref >59)

## 2022-03-26 LAB — CBC
Hematocrit: 39.1 % (ref 34.0–46.6)
Hemoglobin: 12.5 g/dL (ref 11.1–15.9)
MCH: 28.3 pg (ref 26.6–33.0)
MCHC: 32 g/dL (ref 31.5–35.7)
MCV: 89 fL (ref 79–97)
Platelets: 387 10*3/uL (ref 150–450)
RBC: 4.41 x10E6/uL (ref 3.77–5.28)
RDW: 12.8 % (ref 11.7–15.4)
WBC: 5.7 10*3/uL (ref 3.4–10.8)

## 2022-03-26 LAB — MAGNESIUM: Magnesium: 2.3 mg/dL (ref 1.6–2.3)

## 2022-03-26 LAB — TSH: TSH: 1.81 u[IU]/mL (ref 0.450–4.500)

## 2022-04-02 ENCOUNTER — Other Ambulatory Visit: Payer: Self-pay | Admitting: Internal Medicine

## 2022-04-02 DIAGNOSIS — N644 Mastodynia: Secondary | ICD-10-CM

## 2022-04-06 ENCOUNTER — Other Ambulatory Visit: Payer: Self-pay | Admitting: Cardiovascular Disease

## 2022-04-06 NOTE — Telephone Encounter (Signed)
Prescription refill request for Eliquis received. Indication:pe Last office visit:12/23 Scr:0.9 Age: 76 Weight:56.5  Prescription refilled

## 2022-04-15 ENCOUNTER — Ambulatory Visit
Admission: RE | Admit: 2022-04-15 | Discharge: 2022-04-15 | Disposition: A | Discharge status: Home / Self Care | Payer: Medicare Other | Source: Ambulatory Visit | Attending: Internal Medicine | Admitting: Internal Medicine

## 2022-04-15 DIAGNOSIS — N644 Mastodynia: Secondary | ICD-10-CM

## 2022-05-11 ENCOUNTER — Ambulatory Visit: Payer: Medicare Other | Admitting: Cardiovascular Disease

## 2022-05-20 ENCOUNTER — Encounter (HOSPITAL_BASED_OUTPATIENT_CLINIC_OR_DEPARTMENT_OTHER): Payer: Self-pay | Admitting: Pulmonary Disease

## 2022-05-20 ENCOUNTER — Ambulatory Visit (INDEPENDENT_AMBULATORY_CARE_PROVIDER_SITE_OTHER): Payer: Medicare Other | Admitting: Pulmonary Disease

## 2022-05-20 ENCOUNTER — Ambulatory Visit (INDEPENDENT_AMBULATORY_CARE_PROVIDER_SITE_OTHER): Payer: Medicare Other

## 2022-05-20 VITALS — BP 126/60 | HR 68 | Ht 62.0 in | Wt 121.0 lb

## 2022-05-20 DIAGNOSIS — R0609 Other forms of dyspnea: Secondary | ICD-10-CM

## 2022-05-20 NOTE — Patient Instructions (Signed)
Lab tests and chest xray today  Will arrange for pulmonary function test  Follow up in 8 weeks

## 2022-05-20 NOTE — Progress Notes (Signed)
[facility]  Chief Complaint  Patient presents with   Consult    Sob has gotten worse in past 53mo    Past Surgical History:  She  has a past surgical history that includes left heart catheterization with coronary angiogram (N/A, 11/10/2011); Appendectomy (11/21/2002); Rotator cuff repair (1995); transthoracic echocardiogram (10/2011); NM MYOCAR PERF WALL MOTION (10/2011); and ABI - BILATERAL (2008).  Past Medical History:  PE March 2021, HLD, HTN, Leriche syndrome, PAD  Constitutional:  BP 126/60 (BP Location: Left Arm, Cuff Size: Normal)   Pulse 68   Ht 5\' 2"  (1.575 m)   Wt 121 lb (54.9 kg)   SpO2 100%   BMI 22.13 kg/m   Brief Summary:  Hannah [name] is a 77 y.o. female former smoker with dyspnea.      Subjective:   She has noticed trouble with her breathing.  This has been getting progressively worse for years.  She has intermittent cough and gets wheezing sometimes.  She also feels like her throat closes sometimes.  She has trouble with her swallowing and feels like the food sometimes just trickles down her esophagus.  She has dry skin and gets cracking of her skin on her hands.  For the past few months her finger types will change color and get painful.  This tends to happen in the afternoon.  She isn't sure if this happens when her hands get cold.    She smoked in her 62's and then again in her mid 5's to early 43's.  She was never told she has asthma.  No history of pneumonia or TB.  She was diagnosed with a pulmonary embolism in 2021 and has been on eliquis.  She doesn't feel like she has any issues with her breathing while asleep.  Physical Exam:   Appearance - well kempt   ENMT - no sinus tenderness, no oral exudate, no LAN, Mallampati 3 airway, no stridor  Respiratory - equal breath sounds bilaterally, no wheezing or rales  CV - s1s2 regular rate and rhythm, no murmurs  Ext - no clubbing, no edema  Skin - no  rashes  Psych - normal mood and affect   Pulmonary testing:    Chest Imaging:  CT angio chest 07/07/19 >> distal Rt main pulmonary artery PE and LLL segmental arteries  Sleep Tests:  PSG 06/11/16 >> 0.8, SpO2 89%  Cardiac Tests:  Echo 09/27/18 >> EF 50 to 55%  Social History:  She  reports that she quit smoking about 35 years ago. Her smoking use included cigarettes. She has a 11.50 pack-year smoking history. She has never used smokeless tobacco. She reports current alcohol use. She reports that she does not use drugs.  Family History:  Her family history includes Breast cancer (age of onset: 19) in her sister; Cancer in her father; Heart Problems in her child; Heart attack in her father; Heart disease in her father; Hypertension in her brother, father, and sister; Stroke in her sister.    Discussion:  She has progressive dyspnea on exertion.  She has remote history of smoking.  She reports difficulty with her swallowing, cracking of the skin on her hands, and intermittent painful discoloration of her finger types.    Assessment/Plan:   Dyspnea on exertion. - unclear cause - will arrange for CBC with diff, ANA, SCL 70, SSA/SSB - will arrange for chest xray and pulmonary function - additional interventions to be determined based on above test  results  Chest pain, peripheral artery disease. - followed by Dr. Gwenlyn Found with cardiology  History of pulmonary embolism. - on indefinite anticoagulation through her PCP and cardiology  Time Spent Involved in Patient Care on Day of Examination:  46 minutes  Follow up:   Patient Instructions  Lab tests and chest xray today  Will arrange for pulmonary function test  Follow up in 8 weeks  Medication List:   Allergies as of 05/20/2022       Reactions   Amoxicillin Swelling   Ramipril Swelling   Angioedema        Medication List        Accurate as of May 20, 2022  3:37 PM. If you have any questions, ask your nurse or  doctor.          amLODipine 10 MG tablet Commonly known as: NORVASC Take 10 mg by mouth daily.   baclofen 10 MG tablet Commonly known as: LIORESAL Take 10 mg by mouth as needed for muscle spasms.   Coenzyme Q-10 200 MG Caps Take 200 mg by mouth daily.   Dexilant 60 MG capsule Generic drug: dexlansoprazole Take 60 mg by mouth daily.   diazepam 5 MG tablet Commonly known as: VALIUM Take 2.5 mg by mouth daily.   Eliquis 5 MG Tabs tablet Generic drug: apixaban TAKE 1 TABLET BY MOUTH TWICE DAILY   gabapentin 100 MG capsule Commonly known as: NEURONTIN Take 100 mg by mouth 3 (three) times daily.   hydrochlorothiazide 12.5 MG tablet Commonly known as: HYDRODIURIL Take 12.5 mg by mouth daily.   metFORMIN 500 MG tablet Commonly known as: GLUCOPHAGE Take 500 mg by mouth daily.   metoprolol tartrate 25 MG tablet Commonly known as: LOPRESSOR Take 1.5 tablets (37.5 mg total) by mouth 2 (two) times daily.   Praluent 150 MG/ML [place] Generic drug: Alirocumab Inject into the skin.   rosuvastatin 40 MG tablet Commonly known as: CRESTOR Take 40 mg by mouth daily.   Vitamin D (Ergocalciferol) 1.25 MG (50000 UNIT) Caps capsule Commonly known as: DRISDOL Take 50,000 Units by mouth every Wednesday.        Signature:  Chesley Mires, MD [place] Pager - [phone] 05/20/2022, 3:37 PM

## 2022-05-26 LAB — CBC WITH DIFFERENTIAL/PLATELET
Basophils Absolute: 0 10*3/uL (ref 0.0–0.2)
Basos: 0 %
EOS (ABSOLUTE): 0 10*3/uL (ref 0.0–0.4)
Eos: 0 %
Hematocrit: 39.6 % (ref 34.0–46.6)
Hemoglobin: 12.9 g/dL (ref 11.1–15.9)
Immature Grans (Abs): 0 10*3/uL (ref 0.0–0.1)
Immature Granulocytes: 0 %
Lymphocytes Absolute: 1.7 10*3/uL (ref 0.7–3.1)
Lymphs: 34 %
MCH: 27.9 pg (ref 26.6–33.0)
MCHC: 32.6 g/dL (ref 31.5–35.7)
MCV: 86 fL (ref 79–97)
Monocytes Absolute: 0.4 10*3/uL (ref 0.1–0.9)
Monocytes: 8 %
Neutrophils Absolute: 2.8 10*3/uL (ref 1.4–7.0)
Neutrophils: 58 %
Platelets: 363 10*3/uL (ref 150–450)
RBC: 4.63 x10E6/uL (ref 3.77–5.28)
RDW: 12.7 % (ref 11.7–15.4)
WBC: 5 10*3/uL (ref 3.4–10.8)

## 2022-05-26 LAB — ANA+ENA+DNA/DS+SCL 70+SJOSSA/B
ANA Titer 1: NEGATIVE
ENA RNP Ab: 0.3 AI (ref 0.0–0.9)
ENA SM Ab Ser-aCnc: <0.2 AI (ref 0.0–0.9)
ENA SSA (RO) Ab: <0.2 AI (ref 0.0–0.9)
ENA SSB (LA) Ab: <0.2 AI (ref 0.0–0.9)
Scleroderma (Scl-70) (ENA) Antibody, IgG: <0.2 AI (ref 0.0–0.9)
dsDNA Ab: <1 IU/mL (ref 0–9)

## 2022-05-26 LAB — SEDIMENTATION RATE: Sed Rate: 9 mm/hr (ref 0–40)

## 2022-05-26 LAB — IGE: IgE (Immunoglobulin E), Serum: 53 IU/mL (ref 6–495)

## 2022-06-01 ENCOUNTER — Encounter: Payer: Self-pay | Admitting: Cardiovascular Disease

## 2022-06-01 ENCOUNTER — Ambulatory Visit: Payer: Medicare Other | Attending: Cardiovascular Disease | Admitting: Cardiovascular Disease

## 2022-06-01 VITALS — BP 142/78 | HR 79 | Ht 62.0 in | Wt 123.8 lb

## 2022-06-01 DIAGNOSIS — I10 Essential (primary) hypertension: Secondary | ICD-10-CM

## 2022-06-01 DIAGNOSIS — R079 Chest pain, unspecified: Secondary | ICD-10-CM

## 2022-06-01 DIAGNOSIS — E785 Hyperlipidemia, unspecified: Secondary | ICD-10-CM | POA: Insufficient documentation

## 2022-06-01 DIAGNOSIS — R002 Palpitations: Secondary | ICD-10-CM | POA: Diagnosis present

## 2022-06-01 DIAGNOSIS — I739 Peripheral vascular disease, unspecified: Secondary | ICD-10-CM | POA: Diagnosis present

## 2022-06-01 DIAGNOSIS — I2694 Multiple subsegmental pulmonary emboli without acute cor pulmonale: Secondary | ICD-10-CM | POA: Diagnosis present

## 2022-06-01 NOTE — Assessment & Plan Note (Signed)
History of acute pulmonary embolism 4 days after her Pfizer COVID-vaccine March 2021 on Eliquis oral anticoagulation.

## 2022-06-01 NOTE — Assessment & Plan Note (Signed)
History of peripheral arterial disease status post peripheral angiography in 2013 revealing occluded aorta below the renal arteries consistent with "Leriche syndrome".  She does have mild claudication but does not wish to pursue surgical revascularization.

## 2022-06-01 NOTE — Assessment & Plan Note (Signed)
History of dyslipidemia on rosuvastatin and Praluent followed by her PCP.

## 2022-06-01 NOTE — Progress Notes (Signed)
06/01/2022 Hannah [name]   11/09/1945  [id number]  Primary Physician Jilda Panda, MD Primary Cardiologist: Lorretta Harp MD Lupe Carney, [place]  HPI:  Hannah [name] is a 77 y.o.     thin-appearing married African American female, mother of 1 child, who I last saw in the office 03/27/2021.  She has a history of normal coronary arteries by catheterization, which I performed November 10, 2011. 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 She really denies significant lifestyle-limiting claudication. Her other problems include remote tobacco abuse, having quit over 20 years ago, treated hypertension, hyperlipidemia, as well as a strong family history for heart disease with a father who had an MI in his 72s and 2 brothers who have had bypass surgery. Since I saw her a year ago , she did have some cramping in her right calf however venous Dopplers were negative for DVT. 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 She ALSO gets occasional morning palpitations and has worn an event monitor in the past which was unrevealing.   Since I saw her a year ago she is remained stable.  She complains of occasional intermittent chest pain, shortness of breath and palpitations although her work-up has been negative.  Dopplers revealed ABIs in the 0.7 range bilaterally.  Since I saw her 6 months ago she continues to complain of lifestyle limiting claudication although she does not wish surgical revascularization.  Her ABIs performed yesterday were unchanged.  She also complains of shortness of breath and morning palpitations.  She drinks 1 cup of coffee a day.  Prior work-up for palpitations performed years ago with an event monitor showed only sinus rhythm.   Lower   extremity arterial Doppler studies performed 06/13/2018 revealed a right ABI 0.69 and a left ABI 0.74.  Recent event monitor performed 08/03/2018 revealed episodes of PSVT and nonsustained ventricular tachycardia.  Her last 2D echo performed 11/29/2016 was essentially normal.     She was admitted on 07/07/2019 to [facility] with chest pain and shortness of breath.  A D-dimer was mildly elevated and it chest CTA was remarkable for pulmonary embolus for which she was placed on Eliquis oral anticoagulation.  This occurred 4 days after her second Pfizer vaccine shot.   Since I saw her in the office a year ago she has remained stable.  She still occasionally gets palpitations on beta-blocker which has been uptitrated by Laurann Montana, NP.  Her most recent event monitor performed 03/25/2022 did show short runs of SVT.  She has some claudication but it is not lifestyle limiting.  She remains on Eliquis oral anticoagulation.  Over the last month or 2 she has noticed increasing shortness of breath with minimal activity but denies chest pain.   Current Meds  Medication Sig   amLODipine (NORVASC) 10 MG tablet Take 10 mg by mouth daily.   baclofen (LIORESAL) 10 MG tablet Take 10 mg by mouth as needed for muscle spasms.   Coenzyme Q-10 200 MG CAPS Take 200 mg by mouth daily.   DEXILANT 60 MG capsule Take 60 mg by mouth daily.   diazepam (VALIUM) 5 MG tablet Take 2.5 mg by mouth daily.    ELIQUIS 5 MG TABS tablet TAKE 1 TABLET BY MOUTH TWICE DAILY   gabapentin (  NEURONTIN) 100 MG capsule Take 100 mg by mouth 3 (three) times daily.   hydrochlorothiazide (HYDRODIURIL) 12.5 MG tablet Take 12.5 mg by mouth daily.   metFORMIN (GLUCOPHAGE) 500 MG tablet Take 500 mg by mouth daily.   metoprolol tartrate (LOPRESSOR) 25 MG tablet Take 1.5 tablets (37.5 mg total) by mouth 2 (two) times daily.   PRALUENT 150 MG/ML SOAJ Inject into the skin.   rosuvastatin (CRESTOR) 40 MG tablet Take 40 mg by mouth daily.   Vitamin  D, Ergocalciferol, (DRISDOL) 50000 units CAPS capsule Take 50,000 Units by mouth every Wednesday.      Allergies  Allergen Reactions   Amoxicillin Swelling   Ramipril Swelling    Angioedema     Social History   Socioeconomic History   Marital status: Married    Spouse name: Not on file   Number of children: 1   Years of education: 12   Highest education level: Not on file  Occupational History   Not on file  Tobacco Use   Smoking status: Former    Packs/day: 0.50    Years: 23.00    Total pack years: 11.50    Types: Cigarettes    Quit date: 1    Years since quitting: 35.1   Smokeless tobacco: Never   Tobacco comments:    Started smoking at 23 and stopped 26 started again at 50 stopped again at 67   Substance and Sexual Activity   Alcohol use: Yes    Comment: occasional   Drug use: No   Sexual activity: Not on file  Other Topics Concern   Not on file  Social History Narrative   Not on file   Social Determinants of Health   Financial Resource Strain: Not on file  Food Insecurity: Not on file  Transportation Needs: Not on file  Physical Activity: Not on file  Stress: Not on file  Social Connections: Not on file  Intimate Partner Violence: Not on file     Review of Systems: General: negative for chills, fever, night sweats or weight changes.  Cardiovascular: negative for chest pain, dyspnea on exertion, edema, orthopnea, palpitations, paroxysmal nocturnal dyspnea or shortness of breath Dermatological: negative for rash Respiratory: negative for cough or wheezing Urologic: negative for hematuria Abdominal: negative for nausea, vomiting, diarrhea, bright red blood per rectum, melena, or hematemesis Neurologic: negative for visual changes, syncope, or dizziness All other systems reviewed and are otherwise negative except as noted above.    Blood pressure (!) 142/78, pulse 79, height 5' 2"$  (1.575 m), weight 123 lb 12.8 oz (56.2 kg), SpO2 97 %.  General  appearance: alert and no distress Neck: no adenopathy, no carotid bruit, no JVD, supple, symmetrical, trachea midline, and thyroid not enlarged, symmetric, no tenderness/mass/nodules Lungs: clear to auscultation bilaterally Heart: regular rate and rhythm, S1, S2 normal, no murmur, click, rub or gallop Extremities: extremities normal, atraumatic, no cyanosis or edema Pulses: 2+ and symmetric Skin: Skin color, texture, turgor normal. 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  She does have mild claudication but does not wish to pursue surgical revascularization.  Essential hypertension History of essential hypertension a blood pressure measured today at 142/78.  She is on amlodipine, hydrochlorothiazide and metoprolol.  Dyslipidemia History of dyslipidemia on rosuvastatin and Praluent followed  by her PCP.  Chest pain with normal coronary angiography History of chest pain in the past with cardiac catheterization performed by myself 11/10/2011 revealing normal coronary arteries.  Palpitations History of palpitations with event monitor performed 08/03/2018 revealing episodes of PSVT and nonsustained ventricular tachycardia.  Her more recent monitor performed 03/25/2022 showed some short runs of SVT but no other significant arrhythmias.  Her beta-blocker was adjusted and her palpitations have improved.  Pulmonary emboli (HCC) History of acute pulmonary embolism 4 days after her Pfizer COVID-vaccine March 2021 on Eliquis oral anticoagulation.     Lorretta Harp MD FACP,FACC,FAHA, [facility] 06/01/2022 11:45 AM

## 2022-06-01 NOTE — Assessment & Plan Note (Signed)
History of essential hypertension a blood pressure measured today at 142/78.  She is on amlodipine, hydrochlorothiazide and metoprolol.

## 2022-06-01 NOTE — Assessment & Plan Note (Signed)
History of palpitations with event monitor performed 08/03/2018 revealing episodes of PSVT and nonsustained ventricular tachycardia.  Her more recent monitor performed 03/25/2022 showed some short runs of SVT but no other significant arrhythmias.  Her beta-blocker was adjusted and her palpitations have improved.

## 2022-06-01 NOTE — Patient Instructions (Addendum)
Medication Instructions:  Your physician recommends that you continue on your current medications as directed. Please refer to the Current Medication list given to you today.  *If you need a refill on your cardiac medications before your next appointment, please call your pharmacy*   Lab Work: Your physician recommends that you labs drawn today: BMET  If you have labs (blood work) drawn today and your tests are completely normal, you will receive your results only by: MyChart Message (if you have MyChart) OR A paper copy in the mail If you have any lab test that is abnormal or we need to change your treatment, we will call you to review the results.   Testing/Procedures: Your physician has requested that you have an echocardiogram. Echocardiography is a painless test that uses sound waves to create images of your heart. It provides your doctor with information about the size and shape of your heart and how well your heart's chambers and valves are working. This procedure takes approximately one hour. There are no restrictions for this procedure. Please do NOT wear cologne, perfume, aftershave, or lotions (deodorant is allowed). Please arrive 15 minutes prior to your appointment time. This procedure will be done at [address]    Follow-Up: At [facility], you and your health needs are our priority.  As part of our continuing mission to provide you with exceptional heart care, we have created designated Provider Care Teams.  These Care Teams include your primary Cardiologist (physician) and Advanced Practice Providers (APPs -  Physician Assistants and Nurse Practitioners) who all work together to provide you with the care you need, when you need it.  We recommend signing up for the patient portal called "MyChart".  Sign up information is provided on this After Visit Summary.  MyChart is used to connect with patients for Virtual Visits (Telemedicine).  Patients are  able to view lab/test results, encounter notes, upcoming appointments, etc.  Non-urgent messages can be sent to your provider as well.   To learn more about what you can do with MyChart, go to [email or web address].    Your next appointment:   6 month(s)  Provider:   Laurann Montana, NP  Then, Quay Burow, MD will plan to see you again in 12 month(s).   Other Instructions   Your cardiac CT will be scheduled at the below location:   [facility] [address] [place], [place] [address] [phone]   If scheduled at [facility], please arrive at the [facility] and Children's Entrance (Entrance C2) of [facility] 30 minutes prior to test start time. You can use the FREE valet parking offered at entrance C (encouraged to control the heart rate for the test)  Proceed to the [facility] Radiology Department (first floor) to check-in and test prep.  All radiology patients and guests should use entrance C2 at [facility], accessed from [facility], even though the hospital's physical address listed is [address].     Please follow these instructions carefully (unless otherwise directed):   On the Night Before the Test: Be sure to Drink plenty of water. Do not consume any caffeinated/decaffeinated beverages or chocolate 12 hours prior to your test. Do not take any antihistamines 12 hours prior to your test.   On the Day of the Test: Drink plenty of water until 1 hour prior to the test. Do not eat any food 1 hour prior to test. You may  take your regular medications prior to the test.  Take metoprolol (Lopressor) 113m (4 tablets) two hours prior to test. If you take Furosemide/Hydrochlorothiazide/Spironolactone, please HOLD on the morning of the test. FEMALES- please wear underwire-free bra if available, avoid dresses & tight clothing  After the Test: Drink plenty of water. After receiving IV contrast, you may  experience a mild flushed feeling. This is normal. On occasion, you may experience a mild rash up to 24 hours after the test. This is not dangerous. If this occurs, you can take Benadryl 25 mg and increase your fluid intake. If you experience trouble breathing, this can be serious. If it is severe call 911 IMMEDIATELY. If it is mild, please call our office. If you take any of these medications: Glipizide/Metformin, Avandament, Glucavance, please do not take 48 hours after completing test unless otherwise instructed.  We will call to schedule your test 2-4 weeks out understanding that some insurance companies will need an authorization prior to the service being performed.   For non-scheduling related questions, please contact the cardiac imaging nurse navigator should you have any questions/concerns: SMarchia Bond Cardiac Imaging Nurse Navigator MGordy Clement Cardiac Imaging Nurse Navigator [facility] Direct Office Dial: [phone]  For scheduling needs, including cancellations and rescheduling, please call [place] [phone]

## 2022-06-01 NOTE — Assessment & Plan Note (Signed)
History of chest pain in the past with cardiac catheterization performed by myself 11/10/2011 revealing normal coronary arteries.

## 2022-06-02 LAB — BASIC METABOLIC PANEL
BUN/Creatinine Ratio: 15 (ref 12–28)
BUN: 14 mg/dL (ref 8–27)
CO2: 25 mmol/L (ref 20–29)
Calcium: 8.9 mg/dL (ref 8.7–10.3)
Chloride: 105 mmol/L (ref 96–106)
Creatinine, Ser: 0.93 mg/dL (ref 0.57–1.00)
Glucose: 90 mg/dL (ref 70–99)
Potassium: 3.8 mmol/L (ref 3.5–5.2)
Sodium: 143 mmol/L (ref 134–144)
eGFR: 64 mL/min/{1.73_m2} (ref >59)

## 2022-06-10 ENCOUNTER — Telehealth (HOSPITAL_COMMUNITY): Payer: Self-pay | Admitting: Emergency Medicine

## 2022-06-10 NOTE — Telephone Encounter (Signed)
Reaching out to patient to offer assistance regarding upcoming cardiac imaging study; pt verbalizes understanding of appt date/time, parking situation and where to check in, pre-test NPO status and medications ordered, and verified current allergies; name and call back number provided for further questions should they arise Marchia Bond RN Navigator Cardiac Imaging [facility] [phone] office [phone] cell  Arrival [address] entrance Denies iv issues Aware contrast/nitro

## 2022-06-11 ENCOUNTER — Ambulatory Visit (HOSPITAL_COMMUNITY)
Admission: RE | Admit: 2022-06-11 | Discharge: 2022-06-11 | Disposition: A | Discharge status: Home / Self Care | Payer: Medicare Other | Source: Ambulatory Visit | Attending: Cardiovascular Disease | Admitting: Cardiovascular Disease

## 2022-06-11 ENCOUNTER — Other Ambulatory Visit (HOSPITAL_COMMUNITY): Payer: Medicare Other

## 2022-06-11 DIAGNOSIS — I10 Essential (primary) hypertension: Secondary | ICD-10-CM | POA: Diagnosis present

## 2022-06-11 DIAGNOSIS — I2694 Multiple subsegmental pulmonary emboli without acute cor pulmonale: Secondary | ICD-10-CM | POA: Insufficient documentation

## 2022-06-11 DIAGNOSIS — E785 Hyperlipidemia, unspecified: Secondary | ICD-10-CM | POA: Insufficient documentation

## 2022-06-11 DIAGNOSIS — I739 Peripheral vascular disease, unspecified: Secondary | ICD-10-CM | POA: Diagnosis present

## 2022-06-11 DIAGNOSIS — R079 Chest pain, unspecified: Secondary | ICD-10-CM | POA: Insufficient documentation

## 2022-06-11 DIAGNOSIS — R002 Palpitations: Secondary | ICD-10-CM | POA: Insufficient documentation

## 2022-06-11 MED ORDER — NITROGLYCERIN 0.4 MG SL SUBL
0.8000 mg | SUBLINGUAL_TABLET | Freq: Once | SUBLINGUAL | Status: AC
  Administered 2022-06-11: 0.8 mg via SUBLINGUAL

## 2022-06-11 MED ORDER — IOHEXOL 350 MG/ML SOLN
100.0000 mL | Freq: Once | INTRAVENOUS | Status: AC | PRN
  Administered 2022-06-11: 100 mL via INTRAVENOUS

## 2022-06-11 MED ORDER — NITROGLYCERIN 0.4 MG SL SUBL
SUBLINGUAL_TABLET | SUBLINGUAL | Status: AC
  Filled 2022-06-11: qty 2

## 2022-06-25 ENCOUNTER — Ambulatory Visit (HOSPITAL_COMMUNITY): Payer: Medicare Other | Attending: Internal Medicine

## 2022-06-25 DIAGNOSIS — I739 Peripheral vascular disease, unspecified: Secondary | ICD-10-CM | POA: Diagnosis not present

## 2022-06-25 DIAGNOSIS — R079 Chest pain, unspecified: Secondary | ICD-10-CM | POA: Insufficient documentation

## 2022-06-25 DIAGNOSIS — E785 Hyperlipidemia, unspecified: Secondary | ICD-10-CM | POA: Diagnosis present

## 2022-06-25 DIAGNOSIS — R002 Palpitations: Secondary | ICD-10-CM | POA: Diagnosis present

## 2022-06-25 DIAGNOSIS — I10 Essential (primary) hypertension: Secondary | ICD-10-CM | POA: Insufficient documentation

## 2022-06-25 DIAGNOSIS — I2694 Multiple subsegmental pulmonary emboli without acute cor pulmonale: Secondary | ICD-10-CM | POA: Insufficient documentation

## 2022-06-25 LAB — ECHOCARDIOGRAM COMPLETE
Area-P 1/2: 2.97 cm2
S' Lateral: 2.5 cm

## 2022-07-23 ENCOUNTER — Encounter (HOSPITAL_BASED_OUTPATIENT_CLINIC_OR_DEPARTMENT_OTHER): Payer: Self-pay | Admitting: Pulmonary Disease

## 2022-07-23 ENCOUNTER — Ambulatory Visit (INDEPENDENT_AMBULATORY_CARE_PROVIDER_SITE_OTHER): Payer: Medicare Other | Admitting: Pulmonary Disease

## 2022-07-23 VITALS — BP 148/76 | HR 53 | Ht 62.0 in | Wt 123.0 lb

## 2022-07-23 DIAGNOSIS — R531 Weakness: Secondary | ICD-10-CM

## 2022-07-23 DIAGNOSIS — R0609 Other forms of dyspnea: Secondary | ICD-10-CM

## 2022-07-23 DIAGNOSIS — J849 Interstitial pulmonary disease, unspecified: Secondary | ICD-10-CM | POA: Diagnosis not present

## 2022-07-23 DIAGNOSIS — J984 Other disorders of lung: Secondary | ICD-10-CM | POA: Diagnosis not present

## 2022-07-23 DIAGNOSIS — G709 Myoneural disorder, unspecified: Secondary | ICD-10-CM

## 2022-07-23 LAB — PULMONARY FUNCTION TEST
FEF 25-75 Pre: 2.44 L/sec
FEF2575-%Pred-Pre: 163 %
FEV1-%Pred-Pre: 69 %
FEV1-Pre: 1.32 L
FEV1FVC-%Pred-Pre: 122 %
FEV6-%Pred-Pre: 60 %
FEV6-Pre: 1.45 L
FEV6FVC-%Pred-Pre: 105 %
FVC-%Pred-Pre: 57 %
FVC-Pre: 1.45 L
Pre FEV1/FVC ratio: 91 %
Pre FEV6/FVC Ratio: 100 %
RV % pred: 59 %
RV: 1.31 L
TLC % pred: 53 %
TLC: 2.53 L

## 2022-07-23 MED ORDER — ALBUTEROL SULFATE HFA 108 (90 BASE) MCG/ACT IN AERS: 2.0000 | INHALATION_SPRAY | Freq: Four times a day (QID) | RESPIRATORY_TRACT | 1 refills | Status: AC | PRN

## 2022-07-23 NOTE — Progress Notes (Signed)
Attempted Full PFT; Performed Spirometry and Plethysmography(Lung Volumes); Patient unable to complete DLCO; Patient refused Post-Spirometry due to not wanting to put Albuterol/Xopenex in her body;

## 2022-07-23 NOTE — Patient Instructions (Signed)
Attempted Full PFT; Performed Spirometry and Plethysmography(Lung Volumes); Patient unable to complete DLCO; Patient refused Post-Spirometry due to not wanting to put Albuterol/Xopenex in her body;  

## 2022-07-23 NOTE — Progress Notes (Signed)
[facility]  Chief Complaint  Patient presents with   Follow-up    When she wakes up in the morning she has phlegm     Past Surgical History:  She  has a past surgical history that includes left heart catheterization with coronary angiogram (N/A, 11/10/2011); Appendectomy (11/21/2002); Rotator cuff repair (1995); transthoracic echocardiogram (10/2011); NM MYOCAR PERF WALL MOTION (10/2011); and ABI - BILATERAL (2008).  Past Medical History:  PE March 2021, HLD, HTN, Leriche syndrome, PAD  Constitutional:  BP (!) 148/76 (BP Location: Right Arm, Cuff Size: Normal)   Pulse (!) 53   Ht 5\' 2"  (1.575 m)   Wt 123 lb (55.8 kg)   SpO2 99%   BMI 22.50 kg/m   Brief Summary:  Hannah [name] is a 77 y.o. female former smoker with dyspnea.      Subjective:   She did PFT today.  Showed severe restriction.  She was unable to do diffusion capacity.  She did want to use albuterol during the test since she had never tried this before.  She has cough in the morning with chest congestion.  She feels her muscles are weak and she has trouble taking a deep breath.    She was seen by cardiology and cardiac testing was unremarkable.  Physical Exam:   Appearance - well kempt   ENMT - no sinus tenderness, no oral exudate, no LAN, Mallampati 3 airway, no stridor  Respiratory - equal breath sounds bilaterally, no wheezing or rales  CV - s1s2 regular rate and rhythm, no murmurs  Ext - no clubbing, no edema  Skin - no rashes  Psych - normal mood and affect  Neuro - subtle weakness in arms and legs bilateral, diminished respiratory excursion with deep inhalation   Pulmonary testing:  IgE 05/20/22 >> 53 Serology 05/20/22 >> ANA, DS DNA, RNP, Scl 70, SSA/SSB all negative PFT 07/23/22 >> FEV1 1.32 (69%), FEV1% 91, TLC 2.53 (53%), unable to do DLCO  Chest Imaging:  CT angio chest 07/07/19 >> distal Rt main pulmonary artery PE and LLL segmental  arteries  Sleep Tests:  PSG 06/11/16 >> 0.8, SpO2 89%  Cardiac Tests:  Cardiac CT 06/13/22 >> coronary calcium score 0 Echo 06/25/22 >> EF 60 to 65%, mild LVH, grade 1 DD, mild MR  Social History:  She  reports that she quit smoking about 35 years ago. Her smoking use included cigarettes. She has a 11.50 pack-year smoking history. She has never used smokeless tobacco. She reports current alcohol use. She reports that she does not use drugs.  Family History:  Her family history includes Breast cancer (age of onset: 79) in her sister; Cancer in her father; Heart Problems in her child; Heart attack in her father; Heart disease in her father; Hypertension in her brother, father, and sister; Stroke in her sister.    Discussion:  She has progressive dyspnea on exertion.  PFT showed restrictive defect.  She was unable to perform diffusion capacity testing.  She has subtle findings of muscle weakness.  Assessment/Plan:   Dyspnea on exertion. - unclear cause - will give her trial of albuterol - will arrange for high resolution CT chest to assess for subtle interstitial lung disease - will arrange for referral to neurology to determine if she has early neuromuscular process that is causing her dyspnea  Chest pain, peripheral artery disease. - followed by Dr. Allyson Sabal with cardiology  History of pulmonary embolism. - on indefinite anticoagulation  through her PCP and cardiology  Time Spent Involved in Patient Care on Day of Examination:  38 minutes  Follow up:   Patient Instructions  Albuterol two puffs every 6 hours as needed for cough, wheeze, or chest congestion  Will schedule high resolution CT chest and referral to neurology  Follow up in 8 weeks  Medication List:   Allergies as of 07/23/2022       Reactions   Amoxicillin Swelling   Ramipril Swelling   Angioedema        Medication List        Accurate as of July 23, 2022  4:50 PM. If you have any questions, ask your  nurse or doctor.          albuterol 108 (90 Base) MCG/ACT inhaler Commonly known as: Ventolin HFA Inhale 2 puffs into the lungs every 6 (six) hours as needed for wheezing or shortness of breath. Started by: Coralyn Helling, MD   amLODipine 10 MG tablet Commonly known as: NORVASC Take 10 mg by mouth daily.   baclofen 10 MG tablet Commonly known as: LIORESAL Take 10 mg by mouth as needed for muscle spasms.   Coenzyme Q-10 200 MG Caps Take 200 mg by mouth daily.   Dexilant 60 MG capsule Generic drug: dexlansoprazole Take 60 mg by mouth daily.   diazepam 5 MG tablet Commonly known as: VALIUM Take 2.5 mg by mouth daily.   Eliquis 5 MG Tabs tablet Generic drug: apixaban TAKE 1 TABLET BY MOUTH TWICE DAILY   gabapentin 100 MG capsule Commonly known as: NEURONTIN Take 100 mg by mouth 3 (three) times daily.   hydrochlorothiazide 12.5 MG tablet Commonly known as: HYDRODIURIL Take 12.5 mg by mouth daily.   metFORMIN 500 MG tablet Commonly known as: GLUCOPHAGE Take 500 mg by mouth daily.   metoprolol tartrate 25 MG tablet Commonly known as: LOPRESSOR Take 1.5 tablets (37.5 mg total) by mouth 2 (two) times daily.   Praluent 150 MG/ML Soaj Generic drug: Alirocumab Inject into the skin.   rosuvastatin 40 MG tablet Commonly known as: CRESTOR Take 40 mg by mouth daily.   Vitamin D (Ergocalciferol) 1.25 MG (50000 UNIT) Caps capsule Commonly known as: DRISDOL Take 50,000 Units by mouth every Wednesday.        Signature:  Coralyn Helling, MD [facility] Pulmonary/Critical Care Pager - [phone] 07/23/2022, 4:50 PM

## 2022-07-23 NOTE — Patient Instructions (Signed)
Albuterol two puffs every 6 hours as needed for cough, wheeze, or chest congestion  Will schedule high resolution CT chest and referral to neurology  Follow up in 8 weeks

## 2022-07-29 ENCOUNTER — Encounter: Payer: Self-pay | Admitting: Neurology

## 2022-08-10 ENCOUNTER — Ambulatory Visit: Payer: Medicare Other | Admitting: Neurology

## 2022-08-18 ENCOUNTER — Other Ambulatory Visit (INDEPENDENT_AMBULATORY_CARE_PROVIDER_SITE_OTHER): Payer: Medicare Other

## 2022-08-18 ENCOUNTER — Encounter: Payer: Self-pay | Admitting: Neurology

## 2022-08-18 ENCOUNTER — Ambulatory Visit (INDEPENDENT_AMBULATORY_CARE_PROVIDER_SITE_OTHER): Payer: Medicare Other | Admitting: Neurology

## 2022-08-18 VITALS — BP 133/69 | HR 58 | Ht 62.0 in | Wt 122.0 lb

## 2022-08-18 DIAGNOSIS — R5381 Other malaise: Secondary | ICD-10-CM

## 2022-08-18 DIAGNOSIS — R531 Weakness: Secondary | ICD-10-CM

## 2022-08-18 DIAGNOSIS — R5383 Other fatigue: Secondary | ICD-10-CM

## 2022-08-18 DIAGNOSIS — R0609 Other forms of dyspnea: Secondary | ICD-10-CM | POA: Diagnosis not present

## 2022-08-18 NOTE — Patient Instructions (Signed)
Check labs.  We will notify you of the results.

## 2022-08-18 NOTE — Progress Notes (Signed)
[facility] Neurology Division Clinic Note - Initial Visit   Date: 08/18/2022   Hannah [name] MRN: [id number] DOB: June 16, 1945   Dear Dr. Craige Cotta:  Thank you for your kind referral of Kaysa Silla for consultation of weakness. Although her history is well known to you, please allow [place] to reiterate it for the purpose of our medical record. The patient was accompanied to the clinic by self.   Serita Silver is a 77 y.o. left-handed female with hypertension, hyperlipidemia, PAD, Leriche syndrome, history of PE (2021), and interstitial lung disease presenting for evaluation of weakness.   IMPRESSION/PLAN: This is a 77 year-old female with dyspnea on exertion referred for neuromuscular evaluation of weakness.  She reports generalized fatigue without focal weakness.  She denies diplopia, ptosis, bulbar weakness, or limb weakness.  Neurological exam is entirely normal, without evidence of fatigability.  My overall suspicion for neuromuscular junction disease, such as myasthenia gravis is very low.  To be complete, I will check acetylcholine receptor antibodies and vitamin B12 level (for fatigue).    ------------------------------------------------------------- History of present illness: She reports having shortness of breath for several months, which is worse with exertion.  She also has generalized sense of weakness, which is worse in the morning.  She denies weakness in the arms or legs, but tends to feel tired all the time.  No double vision, droopiness of the eyelids, difficulty with speech/swallow, or limb weakness.  She is able to walk unassisted and climb stairs.  She can perform own ADLs and IADLs.   Out-side paper records, electronic medical record, and images have been reviewed where available and summarized as:  No results found for: "HGBA1C" No results found for: "VITAMINB12" Lab Results  Component Value Date   TSH 1.810 03/25/2022   Lab Results  Component  Value Date   ESRSEDRATE 9 05/20/2022    Past Medical History:  Diagnosis Date   Acute pulmonary embolism (HCC) 07/07/2019   Chest pain with normal coronary angiography 08/09/2014   Cath 2013   Dyslipidemia 06/25/2013   Zetia added Feb 2020- PCP follows   Essential hypertension 06/25/2013   Echo June 2020- EF 50-55%, diastolic dysfunction   Family history of coronary artery disease 01/17/2019   Father and two brothers with early CAD   Family history of heart disease    Hyperlipidemia    Hypertension    Leriche syndrome (HCC)    Normocytic anemia 11/28/2016   Palpitations 06/08/2016   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 Some claudication but she declines further work up. ABis moderately decreased March 2020   Pulmonary emboli (HCC) 07/07/2019   Acute pulmonary lesion    Past Surgical History:  Procedure Laterality Date   ABI - BILATERAL  2008   moderate arterial insufficiency at rest; pulsatile flow of bilateral ankle PVRs   APPENDECTOMY  11/21/2002   acute appendicitis   LEFT HEART CATHETERIZATION WITH CORONARY ANGIOGRAM N/A 11/10/2011   Procedure: LEFT HEART CATHETERIZATION WITH CORONARY ANGIOGRAM;  Surgeon: Runell Gess, MD;  Location[facility] CATH LAB;  Service: Cardiovascular;  Laterality: N/A; - normal left main/LAD/L Cfx/RCA   NM MYOCAR PERF WALL MOTION  10/2011   lexiscan - EF71%, normal perfusion; low risk scan   ROTATOR CUFF REPAIR  1995   TRANSTHORACIC ECHOCARDIOGRAM  10/2011   EF=>55%, calcified moderator band in the RV; borderline LA enlargement; mild MR; mild  TR; trace AV regurg & pulm valve regurg     Medications:  Outpatient Encounter Medications as of 08/18/2022  Medication Sig   albuterol (VENTOLIN HFA) 108 (90 Base) MCG/ACT inhaler Inhale 2 puffs into the lungs every 6 (six) hours as needed for wheezing or shortness of breath.   amLODipine (NORVASC) 10 MG tablet  Take 10 mg by mouth daily.   baclofen (LIORESAL) 10 MG tablet Take 10 mg by mouth as needed for muscle spasms.   Coenzyme Q-10 200 MG CAPS Take 200 mg by mouth daily.   DEXILANT 60 MG capsule Take 60 mg by mouth daily.   diazepam (VALIUM) 5 MG tablet Take 2.5 mg by mouth daily.    ELIQUIS 5 MG TABS tablet TAKE 1 TABLET BY MOUTH TWICE DAILY   gabapentin (NEURONTIN) 100 MG capsule Take 100 mg by mouth 3 (three) times daily. prn   hydrochlorothiazide (HYDRODIURIL) 12.5 MG tablet Take 12.5 mg by mouth daily.   metFORMIN (GLUCOPHAGE) 500 MG tablet Take 500 mg by mouth daily. prn   metoprolol tartrate (LOPRESSOR) 25 MG tablet Take 1.5 tablets (37.5 mg total) by mouth 2 (two) times daily.   PRALUENT 150 MG/ML SOAJ Inject into the skin.   rosuvastatin (CRESTOR) 40 MG tablet Take 40 mg by mouth daily.   Vitamin D, Ergocalciferol, (DRISDOL) 50000 units CAPS capsule Take 50,000 Units by mouth every Wednesday.    No facility-administered encounter medications on file as of 08/18/2022.    Allergies:  Allergies  Allergen Reactions   Amoxicillin Swelling   Ramipril Swelling    Angioedema     Family History: Family History  Problem Relation Age of Onset   Dementia Mother    Heart disease Father    Hypertension Father    Cancer Father    Heart attack Father        in 52s   Stroke Sister    Breast cancer Sister 87   Hypertension Sister    Hypertension Brother        2 with CABG   Heart Problems Child        born with enlarged heart    Social History: Social History   Tobacco Use   Smoking status: Former    Packs/day: 0.50    Years: 23.00    Additional pack years: 0.00    Total pack years: 11.50    Types: Cigarettes    Quit date: 1989    Years since quitting: 35.3   Smokeless tobacco: Never   Tobacco comments:    Started smoking at 8 and stopped 26 started again at 36 stopped again at 69   Substance Use Topics   Alcohol use: Yes    Comment: occasional wine   Drug use: No    Social History   Social History Narrative   Left Handed    Lives in a 2 story with a basement. Lives with husband     Vital Signs:  BP 133/69   Pulse (!) 58   Ht 5\' 2"  (1.575 m)   Wt 122 lb (55.3 kg)   SpO2 97%   BMI 22.31 kg/m    Neurological Exam: MENTAL STATUS including orientation to time, place, person, recent and remote memory, attention span and concentration, language, and fund of knowledge is normal.  Speech is not dysarthric.  CRANIAL NERVES: II:  No visual field defects.     III-IV-VI: Pupils equal round and reactive to light.  Normal conjugate, extra-ocular eye movements in all  directions of gaze.  No nystagmus.  No ptosis at rest or with sustained upgaze.   V:  Normal facial sensation.    VII:  Normal facial symmetry and movements.  Orbicularis oculi, buccinator, and orbicularis oris is 5/5. VIII:  Normal hearing and vestibular function.   IX-X:  Normal palatal movement.   XI:  Normal shoulder shrug and head rotation.   XII:  Normal tongue strength and range of motion, no deviation or fasciculation.  MOTOR:  There is no muscle fatigability with repeated testing.  No atrophy, fasciculations or abnormal movements.  No pronator drift.  Neck flexion is 5/5.  Upper Extremity:  Right  Left  Deltoid  5/5   5/5   Biceps  5/5   5/5   Triceps  5/5   5/5   Wrist extensors  5/5   5/5   Wrist flexors  5/5   5/5   Finger extensors  5/5   5/5   Finger flexors  5/5   5/5   Dorsal interossei  5/5   5/5   Abductor pollicis  5/5   5/5   Tone (Ashworth scale)  0  0   Lower Extremity:  Right  Left  Hip flexors  5/5   5/5   Knee flexors  5/5   5/5   Knee extensors  5/5   5/5   Dorsiflexors  5/5   5/5   Plantarflexors  5/5   5/5   Toe extensors  5/5   5/5   Toe flexors  5/5   5/5   Tone (Ashworth scale)  0  0   MSRs:                                           Right        Left brachioradialis 2+  2+  biceps 2+  2+  triceps 2+  2+  patellar 2+  2+  ankle jerk 2+   2+  Hoffman no  no  plantar response down  down   SENSORY:  Normal and symmetric perception of light touch, pinprick, vibration, and proprioception.  Romberg's sign absent.   COORDINATION/GAIT: Normal finger-to- nose-finger.  Intact rapid alternating movements bilaterally.  Able to rise from a chair without using arms.  Gait narrow based and stable. Tandem and stressed gait intact.     Thank you for allowing me to participate in patient's care.  If I can answer any additional questions, I would be pleased to do so.    Sincerely,    Jaynell Castagnola K. Allena Katz, DO

## 2022-08-19 ENCOUNTER — Ambulatory Visit (HOSPITAL_BASED_OUTPATIENT_CLINIC_OR_DEPARTMENT_OTHER): Payer: Medicare Other

## 2022-08-28 ENCOUNTER — Ambulatory Visit (HOSPITAL_BASED_OUTPATIENT_CLINIC_OR_DEPARTMENT_OTHER)
Admission: RE | Admit: 2022-08-28 | Discharge: 2022-08-28 | Disposition: A | Discharge status: Home / Self Care | Payer: Medicare Other | Source: Ambulatory Visit | Attending: Pulmonary Disease | Admitting: Pulmonary Disease

## 2022-08-28 DIAGNOSIS — J849 Interstitial pulmonary disease, unspecified: Secondary | ICD-10-CM | POA: Diagnosis present

## 2022-08-28 DIAGNOSIS — G709 Myoneural disorder, unspecified: Secondary | ICD-10-CM | POA: Diagnosis present

## 2022-08-28 DIAGNOSIS — R0609 Other forms of dyspnea: Secondary | ICD-10-CM

## 2022-08-28 DIAGNOSIS — J984 Other disorders of lung: Secondary | ICD-10-CM

## 2022-09-05 LAB — MYASTHENIA GRAVIS PANEL 2
A CHR BINDING ABS: <0.3 nmol/L
ACHR Blocking Abs: <15 % Inhibition (ref <15)
Acetylchol Modul Ab: <1 % Inhibition

## 2022-09-05 LAB — VITAMIN B12: Vitamin B-12: 639 pg/mL (ref 200–1100)

## 2022-09-17 ENCOUNTER — Ambulatory Visit (INDEPENDENT_AMBULATORY_CARE_PROVIDER_SITE_OTHER): Payer: Medicare Other | Admitting: Pulmonary Disease

## 2022-09-17 ENCOUNTER — Encounter (HOSPITAL_BASED_OUTPATIENT_CLINIC_OR_DEPARTMENT_OTHER): Payer: Self-pay | Admitting: Pulmonary Disease

## 2022-09-17 VITALS — BP 136/62 | HR 54 | Temp 98.1°F | Ht 62.5 in | Wt 119.8 lb

## 2022-09-17 DIAGNOSIS — R0609 Other forms of dyspnea: Secondary | ICD-10-CM

## 2022-09-17 NOTE — Progress Notes (Signed)
[facility]  Chief Complaint  Patient presents with   Follow-up    Follow up. Patient stated things are still the same.     Past Surgical History:  She  has a past surgical history that includes left heart catheterization with coronary angiogram (N/A, 11/10/2011); Appendectomy (11/21/2002); Rotator cuff repair (1995); transthoracic echocardiogram (10/2011); NM MYOCAR PERF WALL MOTION (10/2011); and ABI - BILATERAL (2008).  Past Medical History:  PE March 2021, HLD, HTN, Leriche syndrome, PAD  Constitutional:  BP 136/62 (BP Location: Left Arm, Patient Position: Sitting, Cuff Size: Normal)   Pulse (!) 54   Temp 98.1 F (36.7 C) (Oral)   Ht 5' 2.5" (1.588 m)   Wt 119 lb 12.8 oz (54.3 kg)   SpO2 100%   BMI 21.56 kg/m   Brief Summary:  Hannah [name] is a 77 y.o. female former smoker with dyspnea.      Subjective:   CT chest was unremarkable.  She was seen by neurology and exam was normal.  Acetylcholine receptor Ab and B12 level normal.  She continues to get short of breath with any activity.  As a result she isn't able to do much at home.  Had some cough this past week, but otherwise no other respiratory symptoms.  She does get anxious when she is short of breath, but hard for her to tell which comes first.  She gets redness in her finger tips when she wakes up in the morning, but gets better fairly quickly after she starts her day.  Physical Exam:   Appearance - well kempt   ENMT - no sinus tenderness, no oral exudate, no LAN, Mallampati 4 airway, no stridor  Respiratory - equal breath sounds bilaterally, no wheezing or rales  CV - s1s2 regular rate and rhythm, no murmurs  Ext - no clubbing, no edema  Skin - no rashes  Psych - normal mood and affect   Pulmonary testing:  IgE 05/20/22 >> 53 Serology 05/20/22 >> ANA, DS DNA, RNP, Scl 70, SSA/SSB all negative PFT 07/23/22 >> FEV1 1.32 (69%), FEV1% 91, TLC 2.53 (53%), unable  to do DLCO  Chest Imaging:  CT angio chest 07/07/19 >> distal Rt main pulmonary artery PE and LLL segmental arteries HRCT chest 09/01/22 >> aortic atherosclerosis, minimal scattered scarring, no ILD  Sleep Tests:  PSG 06/11/16 >> 0.8, SpO2 89%  Cardiac Tests:  Cardiac CT 06/13/22 >> coronary calcium score 0 Echo 06/25/22 >> EF 60 to 65%, mild LVH, grade 1 DD, mild MR  Social History:  She  reports that she quit smoking about 35 years ago. Her smoking use included cigarettes. She has a 11.50 pack-year smoking history. She has never used smokeless tobacco. She reports current alcohol use. She reports that she does not use drugs.  Family History:  Her family history includes Breast cancer (age of onset: 28) in her sister; Cancer in her father; Dementia in her mother; Heart Problems in her child; Heart attack in her father; Heart disease in her father; Hypertension in her brother, father, and sister; Stroke in her sister.    Discussion:  She has progressive dyspnea on exertion with restrictive defect on PFT.  Suspect this might be from deconditioning and poor effort during testing maneuvers.  Assessment/Plan:   Dyspnea on exertion. - unclear cause - will arrange for cardiopulmonary exercise test - might need further assessment for anxiety  Chest pain, peripheral artery disease. - followed by Dr. Allyson Sabal with  cardiology  History of pulmonary embolism. - on indefinite anticoagulation through her PCP and cardiology  Time Spent Involved in Patient Care on Day of Examination:  28 minutes  Follow up:   Patient Instructions  Will arrange for cardiopulmonary exercise test and follow up after this is done  Medication List:   Allergies as of 09/17/2022       Reactions   Amoxicillin Swelling   Ramipril Swelling   Angioedema        Medication List        Accurate as of September 17, 2022  1:37 PM. If you have any questions, ask your nurse or doctor.          albuterol 108 (90 Base)  MCG/ACT inhaler Commonly known as: Ventolin HFA Inhale 2 puffs into the lungs every 6 (six) hours as needed for wheezing or shortness of breath.   amLODipine 10 MG tablet Commonly known as: NORVASC Take 10 mg by mouth daily.   baclofen 10 MG tablet Commonly known as: LIORESAL Take 10 mg by mouth as needed for muscle spasms.   Coenzyme Q-10 200 MG Caps Take 200 mg by mouth daily.   Dexilant 60 MG capsule Generic drug: dexlansoprazole Take 60 mg by mouth daily.   diazepam 5 MG tablet Commonly known as: VALIUM Take 2.5 mg by mouth daily.   Eliquis 5 MG Tabs tablet Generic drug: apixaban TAKE 1 TABLET BY MOUTH TWICE DAILY   gabapentin 100 MG capsule Commonly known as: NEURONTIN Take 100 mg by mouth 3 (three) times daily. prn   hydrochlorothiazide 12.5 MG tablet Commonly known as: HYDRODIURIL Take 12.5 mg by mouth daily.   metFORMIN 500 MG tablet Commonly known as: GLUCOPHAGE Take 500 mg by mouth daily. prn   metoprolol tartrate 25 MG tablet Commonly known as: LOPRESSOR Take 1.5 tablets (37.5 mg total) by mouth 2 (two) times daily.   Praluent 150 MG/ML Soaj Generic drug: Alirocumab Inject into the skin.   rosuvastatin 40 MG tablet Commonly known as: CRESTOR Take 40 mg by mouth daily.   Vitamin D (Ergocalciferol) 1.25 MG (50000 UNIT) Caps capsule Commonly known as: DRISDOL Take 50,000 Units by mouth every Wednesday.        Signature:  Coralyn Helling, MD [facility] Pulmonary/Critical Care Pager - [phone] 09/17/2022, 1:37 PM

## 2022-09-17 NOTE — Patient Instructions (Signed)
Will arrange for cardiopulmonary exercise test and follow up after this is done

## 2022-09-20 ENCOUNTER — Other Ambulatory Visit (HOSPITAL_BASED_OUTPATIENT_CLINIC_OR_DEPARTMENT_OTHER): Payer: Self-pay | Admitting: Family

## 2022-09-20 DIAGNOSIS — R002 Palpitations: Secondary | ICD-10-CM

## 2022-09-20 NOTE — Telephone Encounter (Signed)
Patient of Dr. Berry. Please review for refill. Thank you!  

## 2022-09-30 ENCOUNTER — Other Ambulatory Visit: Payer: Self-pay | Admitting: Cardiovascular Disease

## 2022-09-30 NOTE — Telephone Encounter (Signed)
Prescription refill request for Eliquis received.  Indication: PE Last office visit: Dan Humphreys, 03/25/2022 Scr: 0.93, 06/01/2022 Age: 77 yo  Weight: 54.3 kg   Refill sent.

## 2022-11-09 ENCOUNTER — Telehealth (HOSPITAL_COMMUNITY): Payer: Self-pay | Admitting: *Deleted

## 2022-11-09 NOTE — Telephone Encounter (Signed)
Called patient for reminder of CPX appointment on 8/6 at 9am . Patient declined appointment due to being too early in the morning and rescheduled for 8/22 at 11am. All applicable questions were answered. Provided patient with call back [phone] if cancellation/reschedule is necessary. Any clinical concerns beyond CPX procedure were advised to be discussed with ordering provider.    Reggy Eye, MS, ACSM, [facility] Clinical Exercise Physiologist/ Health and Wellness Coach

## 2022-11-10 LAB — LAB REPORT - SCANNED
A1c: 6.2
EGFR: 66

## 2022-11-16 ENCOUNTER — Encounter (HOSPITAL_COMMUNITY): Payer: Medicare Other

## 2022-11-17 ENCOUNTER — Encounter (HOSPITAL_COMMUNITY): Payer: Medicare Other

## 2022-11-30 ENCOUNTER — Telehealth (HOSPITAL_COMMUNITY): Payer: Self-pay | Admitting: *Deleted

## 2022-11-30 NOTE — Telephone Encounter (Signed)
Called patient for reminder of CPX appointment on 8/22 at 11am . Patient declined appointment due to wanting to push appointment out further, despite encouragement to keep appointment. rescheduled for 9/11 at 1pm. All applicable questions were answered. Provided patient with call back [phone] if cancellation/reschedule is necessary. Emailed instructions and any clinical concerns beyond CPX procedure were advised to be discussed with ordering provider.     Reggy Eye, MS, ACSM, [facility] Clinical Exercise Physiologist/ Health and Wellness Coach

## 2022-12-02 ENCOUNTER — Encounter (HOSPITAL_COMMUNITY): Payer: Medicare Other

## 2022-12-22 ENCOUNTER — Ambulatory Visit (HOSPITAL_COMMUNITY): Payer: Medicare Other | Attending: Pulmonary Disease

## 2022-12-22 DIAGNOSIS — R0609 Other forms of dyspnea: Secondary | ICD-10-CM | POA: Insufficient documentation

## 2023-01-17 NOTE — Progress Notes (Signed)
I called the pt and there was no answer- LMTCB. ?

## 2023-01-27 ENCOUNTER — Telehealth: Payer: Self-pay | Admitting: Pulmonary Disease

## 2023-01-27 NOTE — Telephone Encounter (Signed)
I called the patient and get the results of cardiopulmonary exercise test performed on 12/22/2022 She is a former Dr. Craige Cotta patient will need a follow-up appointment with APP or a new doctor.  Please call her and make a follow-up visit.

## 2023-03-19 ENCOUNTER — Other Ambulatory Visit (HOSPITAL_BASED_OUTPATIENT_CLINIC_OR_DEPARTMENT_OTHER): Payer: Self-pay | Admitting: Family

## 2023-03-19 DIAGNOSIS — R002 Palpitations: Secondary | ICD-10-CM

## 2023-03-28 ENCOUNTER — Encounter: Payer: Self-pay | Admitting: Primary Care

## 2023-03-28 ENCOUNTER — Other Ambulatory Visit: Payer: Self-pay | Admitting: Cardiovascular Disease

## 2023-03-28 ENCOUNTER — Ambulatory Visit: Payer: Medicare Other | Admitting: Primary Care

## 2023-03-28 VITALS — BP 134/69 | HR 53 | Temp 96.9°F | Ht 62.0 in | Wt 124.8 lb

## 2023-03-28 DIAGNOSIS — J984 Other disorders of lung: Secondary | ICD-10-CM | POA: Diagnosis not present

## 2023-03-28 DIAGNOSIS — R0609 Other forms of dyspnea: Secondary | ICD-10-CM

## 2023-03-28 NOTE — Patient Instructions (Addendum)
-SHORTNESS OF BREATH: Your shortness of breath does not appear to be caused by any heart or lung disease based on your recent tests. It is likely due to deconditioning, which means your body may have become less fit due to reduced physical activity. We recommend you join a pulmonary rehab program at [facility] for a structured exercise plan. If this is not covered by your insurance, you might consider [facility] or a home exercise program.  -DEPRESSION: Your depression and anxiety are affecting your motivation to stay active. You have been given samples of medication for depression by your primary care doctor, but you have not started taking them yet. Please follow up with your primary care doctor to discuss how the medication is working once you start it. If your symptoms do not improve, we may consider referring you to a psychiatrist.   Follow-up: Please follow up in 8 weeks with Dr. Isaiah [name] or Hannah Sandy NP (30 mins) to assess your progress and adjust the plan as needed.    Pulmonary Rehabilitation Pulmonary rehabilitation, also called pulmonary rehab, is a program that helps people manage their breathing problems. The main goals are to increase endurance, reduce breathlessness, and improve quality of life. Pulmonary rehab can last 4-12 weeks or more, depending on your condition. You may need pulmonary rehab if: You are recovering from lung surgery. You have ongoing (chronic) lung problems or a condition that makes it hard to breathe, such as: A disease that causes scars in lung tissue (interstitial lung disease), including idiopathic pulmonary fibrosis and sarcoidosis. Chronic obstructive pulmonary disease (COPD). Cystic fibrosis. Diseases that affect the muscles used for breathing, such as muscular dystrophy. Benefits of pulmonary rehab Pulmonary rehab may help you: Increase your ability to exercise. Reduce breathing problems. Quit smoking. Learn how to use oxygen  therapy. Manage and understand your medicines and treatment. Teach your family about your condition and how to take part in your recovery. Get support from health experts as well as other people with similar problems. Other benefits include: Learning how to eat a healthy diet. Managing a healthy weight. Managing anxiety and depression. Tell a health care provider about: Any allergies you have. All medicines you are taking, including vitamins, herbs, eye drops, creams, and over-the-counter medicines. Any bleeding problems you have. Any surgeries you have had. Any medical conditions you have. Whether you are pregnant or may be pregnant. What are the risks? Generally, pulmonary rehab is safe. However, problems may occur, including: Exercise-related injuries. Higher risk for heart attack or stroke in some cases, due to certain types of exercise. This is rare. What happens before treatment? You may have a physical exam in which your health care provider may: Do blood tests. Test how well you can breathe and how your lungs function. Test your ability to exercise, such as how long you can walk on a treadmill. Your health care providers will work with you to make a treatment plan based on your health and your goals. Your program will be tailored to fit your needs and may change as you make progress. What happens during treatment? Exercise training Exercise training involves doing physical activity, such as: Stretching routines. Strength-building exercises with weights. You will work on becoming stronger in both your arms and legs. Aerobic exercises to improve endurance. You may use a stationary bike or treadmill. Exercise training will vary based on how much activity you can handle, and it will slowly become harder or more intense as you build up  your endurance. In most cases, you will have exercise training 3 days a week. After you finish pulmonary rehab, your health care provider will  create an exercise program to help you maintain your progress. Education Your rehab team will teach you about your disease and ways you can manage symptoms. You may have one-on-one sessions or group meetings. You may learn: How to avoid situations that can worsen symptoms. When and how to take your medicines. How to prevent lung infections. How to quit smoking. How to use oxygen therapy. Nutrition support Being overweight or underweight can affect your breathing. You may meet with nutritionists to come up with the right diet for you. This may include: A healthy eating plan to help you lose weight. Adding calorie or protein supplements to help you gain weight or avoid losing weight. Breathing training Breathing exercises can help you better control your breathing by taking deeper breaths, less often. Breathing exercises may include: Pursed-lip breathing. During this technique, you will inhale through your nose and exhale for 4-6 seconds through pursed lips, such as a kissing or whistling position. Belly breathing (diaphragmatic breathing). During this technique, you place your hands on your stomach and inhale through your nose. As you breathe in, you should feel your belly rise as air fills your diaphragm. Then, you exhale slowly through pursed lips as you feel your stomach falling.  Energy conservation training Your rehab team will show you how to complete everyday tasks without getting out of breath. This may include techniques for avoiding bending, lifting, or reaching. Counseling You may receive individual or group counseling during pulmonary rehab. These meetings can help with any anxiety, depression, or frustrations you may be feeling. Counseling may include: Muscle relaxation exercises. Techniques for dealing with stress or panic. Your family members and caregivers may also participate in counseling. What can I expect after the treatment? Follow these instructions at home: Exercise  regularly as told by your health care providers. Your health care providers will create an exercise plan to help you maintain your endurance and your overall health. Do not use any products that contain nicotine or tobacco. These products include cigarettes, chewing tobacco, and vaping devices, such as e-cigarettes. If you need help quitting, ask your health care provider. Take over-the-counter and prescription medicines only as told by your health care provider. Keep all follow-up visits. This is important. Contact a health care provider if: You have questions about your program. You do not notice any improvement in your breathing or endurance. You develop new or worse symptoms. You have a fever or chills. Get help right away if: You have shortness of breath when you are sitting still or lying down. You feel dizzy or you faint. Summary Pulmonary rehabilitation is a program that helps people manage their breathing problems. Your program will be tailored to you. The main goals of pulmonary rehab are to increase endurance, reduce breathlessness, and improve quality of life. Once you finish rehab, your health care providers will create an exercise plan to help you maintain your progress. Do not use any products that contain nicotine or tobacco. These products include cigarettes, chewing tobacco, and vaping devices, such as e-cigarettes. If you need help quitting, ask your health care provider. This information is not intended to replace advice given to you by your health care provider. Make sure you discuss any questions you have with your health care provider. Document Revised: 11/18/2020 Document Reviewed: 11/18/2020 Elsevier Patient Education  2024 [employer].

## 2023-03-28 NOTE — Progress Notes (Signed)
@Patient  ID: Hannah [name], female    DOB: 03-Jan-1946, 77 y.o.   MRN: [id number]  Chief Complaint  Patient presents with   Follow-up    Referring provider: Ralene Ok, MD  HPI: 77 year old female, smoker quit 1989 (11.5-pack-year history).  Past medical history significant for hypertension, acute pulmonary embolism, peripheral artery disease, dyslipidemia.  Patient of Dr. Craige Cotta, last seen in June 2024 for dyspnea.  03/28/2023 Discussed the use of AI scribe software for clinical note transcription with the patient, who gave verbal consent to proceed.  Patient presents today for 75-month follow-up.  She was last seen by Dr. Craige Cotta in June for progressive dyspnea symptoms with restrictive defect on PFTs.  CT chest was unremarkable.  She was seen by neurology and exam was normal.  Acetylcholine receptor AB and B12 level was normal.  Dr. Evlyn Courier last note dyspnea felt to be related to deconditioning and poor after during testing maneuvers, exact cause of dyspnea unclear.  She might also need further assessment for anxiety. Arrange for cardio exercise test.  Follows with Dr. Gery Pray with cardiology.  On indefinite anticoagulation through her [facility] and cardiology for history of pulmonary embolism.  The patient underwent a cardiac stress test, which showed no evidence of cardiopulmonary abnormality. The test suggested mild functional impairment compared to age-matched sedentary norms, indicating potential deconditioning.  The patient has been experiencing depression and anxiety, which has led to decreased physical activity and potential deconditioning. She has recently received samples of an unspecified medication for depression from her primary care provider, but has not yet started taking it.  The patient also reports swelling in her fingertips, but neurological examination and testing, including acetylcholine receptor testing and B12 levels, were normal. The patient has previously participated in a  friend group exercise program via Zoom, but this was discontinued due to the illness of a group member.      Cardiac testing 12/22/22 Cardiopulmonary stress test>> Conclusion: The interpretation of this test is limited due to submaximal effort during the exercise. Based on available data, exercise testing with gas exchange demonstrates mild functional impairment when compared to matched sedentary norms. There is no evidence for cardiopulmonary abnormality. Patient would likely benefit from routine, supervised exercise program.  Pulmonary function testing 07/23/22 PFTs>> FVC 1.45 (57%), FEV1 1.32 (69%), ratio 91 Spirometry suggestive of restrictive defect, pattern suggestive of respiratory muscle weakness or suboptimal effort.  Patient was unable to perform diffusion capacity test maneuver and opted against bronchodilator challenge.  Imaging 08/28/22 HRCT>> No evidence of interstitial lung disease. Negative for subpleural reticulation, traction bronchiectasis/bronchiolectasis, ground-glass, architectural distortion or honeycombing. Minimal scattered pulmonary parenchymal scarring. No air trapping. No pleural fluid. Airway is unremarkable.  Aortic atherosclerosis (ICD10-I70.0).   Allergies  Allergen Reactions   Amoxicillin Swelling   Ramipril Swelling    Angioedema     Immunization History  Administered Date(s) Administered   Fluad Trivalent(High Dose 65+) 02/07/2023   PFIZER(Purple Top)SARS-COV-2 Vaccination 06/03/2019, 06/27/2019    Past Medical History:  Diagnosis Date   Acute pulmonary embolism (HCC) 07/07/2019   Chest pain with normal coronary angiography 08/09/2014   Cath 2013   Dyslipidemia 06/25/2013   Zetia added Feb 2020- PCP follows   Essential hypertension 06/25/2013   Echo June 2020- EF 50-55%, diastolic dysfunction   Family history of coronary artery disease 01/17/2019   Father and two brothers with early CAD   Family history of heart disease    Hyperlipidemia     Hypertension    Leriche syndrome (  HCC)    Normocytic anemia 11/28/2016   Palpitations 06/08/2016   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 Some claudication but she declines further work up. ABis moderately decreased March 2020   Pulmonary emboli (HCC) 07/07/2019   Acute pulmonary lesion    Tobacco History: Social History   Tobacco Use  Smoking Status Former   Current packs/day: 0.00   Average packs/day: 0.5 packs/day for 23.0 years (11.5 ttl pk-yrs)   Types: Cigarettes   Start date: 1966   Quit date: 1989   Years since quitting: 35.9  Smokeless Tobacco Never  Tobacco Comments   Started smoking at 38 and stopped 26 started again at 36 stopped again at 89    Counseling given: Not Answered Tobacco comments: Started smoking at 32 and stopped 26 started again at 36 stopped again at 42    Outpatient Medications Prior to Visit  Medication Sig Dispense Refill   albuterol (VENTOLIN HFA) 108 (90 Base) MCG/ACT inhaler Inhale 2 puffs into the lungs every 6 (six) hours as needed for wheezing or shortness of breath. 18 g 1   amLODipine (NORVASC) 10 MG tablet Take 10 mg by mouth daily.     Coenzyme Q-10 200 MG CAPS Take 200 mg by mouth daily.     DEXILANT 60 MG capsule Take 60 mg by mouth daily.  0   diazepam (VALIUM) 5 MG tablet Take 2.5 mg by mouth daily.      ELIQUIS 5 MG TABS tablet TAKE 1 TABLET BY MOUTH TWICE DAILY 180 tablet 1   hydrochlorothiazide (HYDRODIURIL) 12.5 MG tablet Take 12.5 mg by mouth daily.     metFORMIN (GLUCOPHAGE) 500 MG tablet Take 500 mg by mouth daily. prn     metoprolol tartrate (LOPRESSOR) 25 MG tablet TAKE 1 AND 1/2 TABLETS(37.5 MG) BY MOUTH TWICE DAILY 270 tablet 0   PRALUENT 150 MG/ML SOAJ Inject into the skin.     rosuvastatin (CRESTOR) 40 MG tablet Take 40 mg by mouth daily.     Vitamin D, Ergocalciferol, (DRISDOL) 50000 units CAPS capsule Take 50,000 Units  by mouth every Wednesday.   2   baclofen (LIORESAL) 10 MG tablet Take 10 mg by mouth as needed for muscle spasms. (Patient not taking: Reported on 03/28/2023)     gabapentin (NEURONTIN) 100 MG capsule Take 100 mg by mouth 3 (three) times daily. prn (Patient not taking: Reported on 03/28/2023)     No facility-administered medications prior to visit.    Review of Systems  Review of Systems  Constitutional:  Positive for fatigue.  HENT: Negative.    Respiratory:  Positive for shortness of breath.   Cardiovascular: Negative.   Musculoskeletal:  Positive for arthralgias.   Physical Exam  BP 134/69 (BP Location: Left Arm, Patient Position: Sitting, Cuff Size: Normal)   Pulse (!) 53   Temp (!) 96.9 F (36.1 C) (Temporal)   Ht 5\' 2"  (1.575 m)   Wt 124 lb 12.8 oz (56.6 kg)   SpO2 100%   BMI 22.83 kg/m  Physical Exam Constitutional:      General: She is not in acute distress.    Appearance: Normal appearance. She is not ill-appearing.     Comments: Appears thin/frail  HENT:     Head: Normocephalic and atraumatic.  Cardiovascular:     Rate and Rhythm: Normal rate and regular rhythm.  Pulmonary:     Effort:  Pulmonary effort is normal.     Breath sounds: Normal breath sounds. No wheezing, rhonchi or rales.     Comments: CTA, poor effort Musculoskeletal:        General: No swelling or deformity. Normal range of motion.  Skin:    General: Skin is warm and dry.  Neurological:     General: No focal deficit present.     Mental Status: She is alert and oriented to person, place, and time. Mental status is at baseline.  Psychiatric:        Mood and Affect: Mood normal.        Behavior: Behavior normal.        Thought Content: Thought content normal.        Judgment: Judgment normal.      Lab Results:  CBC    Component Value Date/Time   WBC 5.0 05/20/2022 1539   WBC 4.5 01/11/2022 1800   RBC 4.63 05/20/2022 1539   RBC 4.58 01/11/2022 1800   HGB 12.9 05/20/2022 1539   HCT  39.6 05/20/2022 1539   PLT 363 05/20/2022 1539   MCV 86 05/20/2022 1539   MCH 27.9 05/20/2022 1539   MCH 28.6 01/11/2022 1800   MCHC 32.6 05/20/2022 1539   MCHC 32.6 01/11/2022 1800   RDW 12.7 05/20/2022 1539   LYMPHSABS 1.7 05/20/2022 1539   MONOABS 0.4 12/16/2020 1742   EOSABS 0.0 05/20/2022 1539   BASOSABS 0.0 05/20/2022 1539    BMET    Component Value Date/Time   NA 143 06/01/2022 1156   K 3.8 06/01/2022 1156   CL 105 06/01/2022 1156   CO2 25 06/01/2022 1156   GLUCOSE 90 06/01/2022 1156   GLUCOSE 120 (H) 01/11/2022 1800   BUN 14 06/01/2022 1156   CREATININE 0.93 06/01/2022 1156   CALCIUM 8.9 06/01/2022 1156   GFRNONAA >60 01/11/2022 1800   GFRAA >60 08/01/2019 2117    BNP    Component Value Date/Time   BNP 25.7 07/07/2019 1617    ProBNP No results found for: "PROBNP"  Imaging: No results found.   Assessment & Plan:   1. Dyspnea on exertion (Primary) - AMB referral to pulmonary rehabilitation  2. Restrictive lung disease - AMB referral to pulmonary rehabilitation      Shortness of Breath No clear cardiopulmonary cause identified. Pulmonary function tests showed some restriction, possibly due to muscle weakness or poor effort during testing. No evidence of COPD or pulmonary fibrosis. Cardiac stress test showed no evidence of cardiopulmonary abnormality. Likely due to deconditioning. -Refer to pulmonary rehab program at [facility] for structured exercise program. If not covered by insurance, consider [facility] or home exercise program.  Depression Patient reports significant depression and anxiety, impacting motivation for physical activity. Primary care doctor has provided samples of medication for depression, but patient has not started yet. -Follow up with primary care doctor regarding effectiveness of depression medication. -Consider referral to a psychiatrist if symptoms do not improve with medication from primary care  doctor.  Follow-up in 8 weeks to assess progress and adjust plan as needed.         Glenford Bayley, NP 03/28/2023

## 2023-03-28 NOTE — Telephone Encounter (Signed)
Prescription refill request for Eliquis received. Indication:PAD Last office visit:2/24 Scr:0.9  7/24 Age: 77 Weight:54.3  KG  PRESCRIPTION REFILLED

## 2023-03-31 ENCOUNTER — Telehealth (HOSPITAL_COMMUNITY): Payer: Self-pay

## 2023-03-31 NOTE — Telephone Encounter (Signed)
Called and spoke with pt in regards to PR, pt stated she is not interested at this time due her work schedule.   Closed referral

## 2023-05-23 ENCOUNTER — Ambulatory Visit: Payer: Medicare Other | Admitting: Primary Care

## 2023-05-27 ENCOUNTER — Other Ambulatory Visit: Payer: Self-pay | Admitting: Internal Medicine

## 2023-05-27 DIAGNOSIS — Z1231 Encounter for screening mammogram for malignant neoplasm of breast: Secondary | ICD-10-CM

## 2023-06-10 ENCOUNTER — Ambulatory Visit
Admission: RE | Admit: 2023-06-10 | Discharge: 2023-06-10 | Disposition: A | Discharge status: Home / Self Care | Payer: Medicare Other | Source: Ambulatory Visit | Attending: Internal Medicine | Admitting: Internal Medicine

## 2023-06-10 DIAGNOSIS — Z1231 Encounter for screening mammogram for malignant neoplasm of breast: Secondary | ICD-10-CM

## 2023-06-13 ENCOUNTER — Encounter: Payer: Self-pay | Admitting: Primary Care

## 2023-06-13 ENCOUNTER — Ambulatory Visit: Payer: Medicare Other | Admitting: Primary Care

## 2023-06-13 VITALS — BP 132/70 | HR 68 | Temp 97.5°F | Ht 62.5 in | Wt 128.2 lb

## 2023-06-13 DIAGNOSIS — F428 Other obsessive-compulsive disorder: Secondary | ICD-10-CM

## 2023-06-13 DIAGNOSIS — R5382 Chronic fatigue, unspecified: Secondary | ICD-10-CM | POA: Diagnosis not present

## 2023-06-13 DIAGNOSIS — F32A Depression, unspecified: Secondary | ICD-10-CM

## 2023-06-13 NOTE — Progress Notes (Signed)
 @Patient  ID: Hannah [name], female    DOB: 1946/01/13, 78 y.o.   MRN: [id number]  Chief Complaint  Patient presents with   Follow-up    Referring provider: Ralene Ok, MD  HPI: 78 year old female, smoker quit 1989 (11.5-pack-year history).  Past medical history significant for hypertension, acute pulmonary embolism, peripheral artery disease, dyslipidemia.  Patient of Dr. Craige Cotta, last seen in June 2024 for dyspnea.  Previous [facility] pulmonary encounter:  03/28/2023 Discussed the use of AI scribe software for clinical note transcription with the patient, who gave verbal consent to proceed.  Patient presents today for 54-month follow-up.  She was last seen by Dr. Craige Cotta in June for progressive dyspnea symptoms with restrictive defect on PFTs.  CT chest was unremarkable.  She was seen by neurology and exam was normal.  Acetylcholine receptor AB and B12 level was normal.  Dr. Evlyn Courier last note dyspnea felt to be related to deconditioning and poor after during testing maneuvers, exact cause of dyspnea unclear.  She might also need further assessment for anxiety. Arrange for cardio exercise test.  Follows with Dr. Gery Pray with cardiology.  On indefinite anticoagulation through her [facility] and cardiology for history of pulmonary embolism.  The patient underwent a cardiac stress test, which showed no evidence of cardiopulmonary abnormality. The test suggested mild functional impairment compared to age-matched sedentary norms, indicating potential deconditioning.  The patient has been experiencing depression and anxiety, which has led to decreased physical activity and potential deconditioning. She has recently received samples of an unspecified medication for depression from her primary care provider, but has not yet started taking it.  The patient also reports swelling in her fingertips, but neurological examination and testing, including acetylcholine receptor testing and B12 levels, were normal. The  patient has previously participated in a friend group exercise program via Zoom, but this was discontinued due to the illness of a group member.      Shortness of Breath No clear cardiopulmonary cause identified. Pulmonary function tests showed some restriction, possibly due to muscle weakness or poor effort during testing. No evidence of COPD or pulmonary fibrosis. Cardiac stress test showed no evidence of cardiopulmonary abnormality. Likely due to deconditioning. -Refer to pulmonary rehab program at [facility] for structured exercise program. If not covered by insurance, consider [facility] or home exercise program.  Depression Patient reports significant depression and anxiety, impacting motivation for physical activity. Primary care doctor has provided samples of medication for depression, but patient has not started yet. -Follow up with primary care doctor regarding effectiveness of depression medication. -Consider referral to a psychiatrist if symptoms do not improve with medication from primary care doctor.  06/13/2023 Discussed the use of AI scribe software for clinical note transcription with the patient, who gave verbal consent to proceed.  History of Present Illness   Hannah [name] is a 78 year old female who presents with shortness of breath for evaluation of unclear cardiopulmonary cause.  She experiences persistent shortness of breath with no identified cardiopulmonary cause. Previous evaluations, including pulmonary function tests and cardiac assessments, have not revealed any heart or lung abnormalities. A CT scan of the chest was negative for interstitial lung disease or fibrosis. There is some coronary artery calcification and a mildly enlarged heart, but stress tests showed no cardiopulmonary abnormalities. She attributes some of her symptoms to deconditioning and has been using a pedal exercise machine at home regularly.  She experiences  significant fatigue, which limits her daily activities such  as housework and laundry. She has consulted a neurologist for weakness, and lab work was negative for myasthenia gravis, with normal B12 levels. She reports finger swelling and redness, particularly in the mornings. She has a family history of lupus.  She reports sleep disturbances, including sleepwalking and acting out dreams. A previous sleep study was inconclusive due to lack of sleep. She has not tried a home sleep study. Her sleep schedule is irregular, often going to bed at 3 or 4 AM and waking up around 8:30 AM, with difficulty maintaining a consistent sleep routine. She wakes up every two hours during the night. She has tried melatonin but did not like the side effects from the medication.   She has not started medication for depression and anxiety provided by her primary care provider, preferring further evaluation. She also reports OCD and sleepwalking episodes. She has not used melatonin recently due to adverse effects in the past.      Cardiac testing 12/22/22 Cardiopulmonary stress test>> Conclusion: The interpretation of this test is limited due to submaximal effort during the exercise. Based on available data, exercise testing with gas exchange demonstrates mild functional impairment when compared to matched sedentary norms. There is no evidence for cardiopulmonary abnormality. Patient would likely benefit from routine, supervised exercise program.  Pulmonary function testing 07/23/22 PFTs>> FVC 1.45 (57%), FEV1 1.32 (69%), ratio 91 Spirometry suggestive of restrictive defect, pattern suggestive of respiratory muscle weakness or suboptimal effort.  Patient was unable to perform diffusion capacity test maneuver and opted against bronchodilator challenge.  Imaging 08/28/22 HRCT>> No evidence of interstitial lung disease. Negative for subpleural reticulation, traction bronchiectasis/bronchiolectasis, ground-glass,  architectural distortion or honeycombing. Minimal scattered pulmonary parenchymal scarring. No air trapping. No pleural fluid. Airway is unremarkable.  Aortic atherosclerosis (ICD10-I70.0).       Allergies  Allergen Reactions   Amoxicillin Swelling   Ramipril Swelling    Angioedema     Immunization History  Administered Date(s) Administered   Fluad Trivalent(High Dose 65+) 02/07/2023   PFIZER(Purple Top)SARS-COV-2 Vaccination 06/03/2019, 06/27/2019    Past Medical History:  Diagnosis Date   Acute pulmonary embolism (HCC) 07/07/2019   Chest pain with normal coronary angiography 08/09/2014   Cath 2013   Dyslipidemia 06/25/2013   Zetia added Feb 2020- PCP follows   Essential hypertension 06/25/2013   Echo June 2020- EF 50-55%, diastolic dysfunction   Family history of coronary artery disease 01/17/2019   Father and two brothers with early CAD   Family history of heart disease    Hyperlipidemia    Hypertension    Leriche syndrome (HCC)    Normocytic anemia 11/28/2016   Palpitations 06/08/2016   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 Some claudication but she declines further work up. ABis moderately decreased March 2020   Pulmonary emboli (HCC) 07/07/2019   Acute pulmonary lesion    Tobacco History: Social History   Tobacco Use  Smoking Status Former   Current packs/day: 0.00   Average packs/day: 0.5 packs/day for 23.0 years (11.5 ttl pk-yrs)   Types: Cigarettes   Start date: 1966   Quit date: 1989   Years since quitting: 36.1  Smokeless Tobacco Never  Tobacco Comments   Started smoking at 54 and stopped 26 started again at 36 stopped again at 41    Counseling given: Not Answered Tobacco comments: Started smoking at 52 and stopped 26 started again at 72  stopped again at 42    Outpatient Medications Prior to Visit  Medication Sig Dispense Refill   albuterol  (VENTOLIN HFA) 108 (90 Base) MCG/ACT inhaler Inhale 2 puffs into the lungs every 6 (six) hours as needed for wheezing or shortness of breath. 18 g 1   amLODipine (NORVASC) 10 MG tablet Take 10 mg by mouth daily.     Coenzyme Q-10 200 MG CAPS Take 200 mg by mouth daily.     DEXILANT 60 MG capsule Take 60 mg by mouth daily.  0   diazepam (VALIUM) 5 MG tablet Take 2.5 mg by mouth daily.      ELIQUIS 5 MG TABS tablet TAKE 1 TABLET BY MOUTH TWICE DAILY 180 tablet 1   hydrochlorothiazide (HYDRODIURIL) 12.5 MG tablet Take 12.5 mg by mouth daily.     metFORMIN (GLUCOPHAGE) 500 MG tablet Take 500 mg by mouth daily. prn     metoprolol tartrate (LOPRESSOR) 25 MG tablet TAKE 1 AND 1/2 TABLETS(37.5 MG) BY MOUTH TWICE DAILY 270 tablet 0   PRALUENT 150 MG/ML SOAJ Inject into the skin.     rosuvastatin (CRESTOR) 40 MG tablet Take 40 mg by mouth daily.     Vitamin D, Ergocalciferol, (DRISDOL) 50000 units CAPS capsule Take 50,000 Units by mouth every Wednesday.   2   No facility-administered medications prior to visit.    Review of Systems  Review of Systems  Constitutional:  Positive for fatigue.  HENT: Negative.    Respiratory:  Positive for shortness of breath. Negative for cough and wheezing.   Musculoskeletal:  Negative for arthralgias.   Physical Exam  BP 132/70 (BP Location: Left Arm, Patient Position: Sitting, Cuff Size: Normal)   Pulse 68   Temp (!) 97.5 F (36.4 C) (Temporal)   Ht 5' 2.5" (1.588 m)   Wt 128 lb 3.2 oz (58.2 kg)   SpO2 98%   BMI 23.07 kg/m  Physical Exam Constitutional:      General: She is not in acute distress.    Appearance: Normal appearance. She is not ill-appearing.  HENT:     Head: Normocephalic and atraumatic.     Mouth/Throat:     Mouth: Mucous membranes are moist.     Pharynx: Oropharynx is clear.  Cardiovascular:     Rate and Rhythm: Normal rate and regular rhythm.  Pulmonary:     Effort: Pulmonary effort is normal.     Breath sounds: Normal breath  sounds.  Abdominal:     General: There is no distension.  Musculoskeletal:        General: Normal range of motion.  Skin:    General: Skin is warm and dry.  Neurological:     General: No focal deficit present.     Mental Status: She is alert and oriented to person, place, and time. Mental status is at baseline.  Psychiatric:        Behavior: Behavior normal.        Thought Content: Thought content normal.        Judgment: Judgment normal.      Lab Results:  CBC    Component Value Date/Time   WBC 5.0 05/20/2022 1539   WBC 4.5 01/11/2022 1800   RBC 4.63 05/20/2022 1539   RBC 4.58 01/11/2022 1800   HGB 12.9 05/20/2022 1539   HCT 39.6 05/20/2022 1539   PLT 363 05/20/2022 1539   MCV 86 05/20/2022 1539   MCH 27.9 05/20/2022 1539   MCH 28.6 01/11/2022 1800  MCHC 32.6 05/20/2022 1539   MCHC 32.6 01/11/2022 1800   RDW 12.7 05/20/2022 1539   LYMPHSABS 1.7 05/20/2022 1539   MONOABS 0.4 12/16/2020 1742   EOSABS 0.0 05/20/2022 1539   BASOSABS 0.0 05/20/2022 1539    BMET    Component Value Date/Time   NA 143 06/01/2022 1156   K 3.8 06/01/2022 1156   CL 105 06/01/2022 1156   CO2 25 06/01/2022 1156   GLUCOSE 90 06/01/2022 1156   GLUCOSE 120 (H) 01/11/2022 1800   BUN 14 06/01/2022 1156   CREATININE 0.93 06/01/2022 1156   CALCIUM 8.9 06/01/2022 1156   GFRNONAA >60 01/11/2022 1800   GFRAA >60 08/01/2019 2117    BNP    Component Value Date/Time   BNP 25.7 07/07/2019 1617    ProBNP No results found for: "PROBNP"  Imaging: No results found.   Assessment & Plan:   1. Chronic fatigue (Primary) - Ambulatory referral to Rheumatology - Ambulatory referral to Psychiatry - Home sleep test; Future  2. Depression, unspecified depression type - Ambulatory referral to Psychiatry  3. Other obsessive-compulsive disorders - Ambulatory referral to Psychiatry      Shortness of Breath No identified cardiopulmonary cause. Restrictive pattern on PFTs possibly due to  muscle weakness or poor effort. No evidence of COPD or pulmonary fibrosis on PFTs or CT chest. Coronary artery disease and aortic calcification noted on CT chest, but heart not significantly enlarged. Stress test negative for cardiopulmonary abnormality. Likely related to deconditioning and muscle weakness. -Encourage regular exercise, aiming for at least 10-20 minutes three times a week. -Consider local gym facilities.  Chronic Fatigue Possible underlying rheumatological cause given family history of lupus and peripheral symptoms in distal fingers. No evidence of myasthenia gravis or B12 deficiency. -Refer to Rheumatology for further evaluation CFS.  Depression and Anxiety Impacting motivation for physical activity. Patient has not started medication samples provided by primary care physician and prefers further evaluation. -Refer to Psychiatry for further evaluation and management.  Sleep Disturbances Reports of sleepwalking and acting out dreams. Irregular sleep schedule with late bedtime and frequent awakenings. Family history of sleep apnea. -Order home sleep study to rule out sleep apnea. -Encourage regular sleep schedule with a consistent bedtime.  Peripheral Finger Changes Reports of swelling and redness in distal fingers, particularly in the morning. No reported pain or cold sensitivity. -Refer to Rheumatology for further evaluation.      Glenford Bayley, NP 06/13/2023

## 2023-06-13 NOTE — Patient Instructions (Addendum)
-  SHORTNESS OF BREATH: Your shortness of breath does not appear to be caused by any heart or lung issues based on previous tests. It is likely related to deconditioning and muscle weakness. We recommend regular exercise, aiming for at least 10 minutes three times a week. You might also consider joining a pulmonary exercise program or using local gym facilities.  -CHRONIC FATIGUE: Chronic fatigue can have many causes, including underlying rheumatological conditions. Given your family history of lupus and symptoms in your fingers, we will refer you to a rheumatologist for further evaluation.  -DEPRESSION AND ANXIETY: Depression and anxiety can affect your motivation and overall well-being. Since you have not started the medication provided by your primary care physician, we will refer you to a psychiatrist for further evaluation and management.  -SLEEP DISTURBANCES: Sleep disturbances, including sleepwalking and acting out dreams, can significantly impact your rest. We will order a home sleep study to rule out sleep apnea and encourage you to maintain a regular sleep schedule with a consistent bedtime.  -PERIPHERAL FINGER CHANGES: Swelling and redness in your fingers, especially in the morning, could be related to a rheumatological condition. We will refer you to a rheumatologist for further evaluation.  INSTRUCTIONS:  Please follow up with the referrals to Rheumatology and Psychiatry as discussed. Additionally, we will arrange for a home sleep study to investigate your sleep disturbances. Continue with regular exercise and try to maintain a consistent sleep schedule.  Follow-up 3 months with Waynetta Sandy NP

## 2023-06-16 ENCOUNTER — Other Ambulatory Visit (HOSPITAL_BASED_OUTPATIENT_CLINIC_OR_DEPARTMENT_OTHER): Payer: Self-pay | Admitting: Family

## 2023-06-16 DIAGNOSIS — R002 Palpitations: Secondary | ICD-10-CM

## 2023-07-12 ENCOUNTER — Telehealth: Payer: Self-pay

## 2023-07-12 NOTE — Telephone Encounter (Signed)
 Copied from CRM [id number]. Topic: General - Other >> Jul 12, 2023  3:35 PM Isabell A wrote: Reason for CRM: Patient is calling to inform Dr.Walsh she is waiting for her sleep monitor from [employer], she last spoke with them on 3/21 - it's been 12 days and she still has not received it.  Please advise [facility].

## 2023-07-19 ENCOUNTER — Other Ambulatory Visit (HOSPITAL_BASED_OUTPATIENT_CLINIC_OR_DEPARTMENT_OTHER): Payer: Self-pay | Admitting: Cardiovascular Disease

## 2023-07-19 DIAGNOSIS — R002 Palpitations: Secondary | ICD-10-CM

## 2023-07-20 ENCOUNTER — Other Ambulatory Visit: Payer: Self-pay

## 2023-07-20 ENCOUNTER — Encounter (HOSPITAL_COMMUNITY): Payer: Self-pay | Admitting: Psychiatry

## 2023-07-20 ENCOUNTER — Ambulatory Visit (HOSPITAL_BASED_OUTPATIENT_CLINIC_OR_DEPARTMENT_OTHER): Payer: Self-pay | Admitting: Psychiatry

## 2023-07-20 VITALS — BP 126/77 | HR 52 | Ht 62.0 in | Wt 126.0 lb

## 2023-07-20 DIAGNOSIS — F4323 Adjustment disorder with mixed anxiety and depressed mood: Secondary | ICD-10-CM | POA: Diagnosis not present

## 2023-07-20 NOTE — Progress Notes (Signed)
 Psychiatric Initial Adult Assessment   Patient Identification: Hannah [name] MRN:  [id number] Date of Evaluation:  07/20/2023 Referral Source:  Chief Complaint:   Visit Diagnosis:   History of Present Illness:   This patient is a 78 year old African-American female who is a mother is being evaluated for persistent daily depression that has been present for many years.  He says it began or rather got worse about 5 years ago around COVID.  The patient is happily married for 32 years.  It is her second marriage.  She has 1 son who is doing well who is married and has 2 children who are doing well.  The patient used to be fully employed but approximately 10 years ago she retired.  She did well after retirement.  The patient's presentation is that she is scare and fearful.  She says she has episodes when it is hard to get out of bed.  She says it is hard to get going hard to take a bath.  At times she does not feel like getting dressed.  She is describing a failure to function at times.  She says her sleep is disturbed and that he gets interrupted.  However she takes no naps and does not really feel sleepy.  She claims her appetite is generally normal yet she also adds that she lost 20 pounds in 6 months.  She says this is not unusual for her appetite is going up and down.  Most distinctly she feels anxious and has a loss of energy.  She denies problems thinking and concentrating.  She denies worthlessness.  She denies any psychomotor changes.  She denies being suicidal now and never has been.  She enjoys reading and watching TV.  She denies the use of alcohol or drugs at any time.  She denies symptoms of mania.  She does describe a chronic sense of worrying and even feeling irritable at times.  But she has few other symptoms of generalized anxiety disorder.  She denies panic disorder or OCD symptomatology.  In the evaluation she will eventually describe that she has had some things that seem to be visual  hallucinations but in the close assessment they really occur more his dreams.  She never has visions during the day.  She describes very rare episodes where she hears voices but is not persistent.  She describes some general medical symptoms like shortness of breath and chest pain but she is very nonspecific and inconsistent in her descriptions.  The patient does describe acute traumatic events in her life.  This includes the event of her mother dying while she was generally healthy and dying in her sleep.  It includes the fact that one of the patient's best friends died this year.  The patient says financially she is doing okay. She has never seen a psychiatrist.  She has no psychiatric history.  The patient has never had therapy.  Presently she is taking 2.5 mg of Valium by her primary care doctor.  She took of Remeron many years ago but does not remember the effect of it. Her medical history is significant for peripheral vascular disease taking Eliquis hypertension on Norvasc and diabetes with metformin.  Associated Signs/Symptoms: Depression Symptoms: Daily depression (Hypo) Manic Symptoms:   Anxiety Symptoms:  Excessive Worry, Psychotic Symptoms:   PTSD Symptoms: NA  Past Psychiatric History: Minimal  Previous Psychotropic Medications: Yes   Substance Abuse History in the last 12 months:  No.  Consequences of Substance Abuse: NA  Past Medical History:  Past Medical History:  Diagnosis Date   Acute pulmonary embolism (HCC) 07/07/2019   Chest pain with normal coronary angiography 08/09/2014   Cath 2013   Dyslipidemia 06/25/2013   Zetia added Feb 2020- PCP follows   Essential hypertension 06/25/2013   Echo June 2020- EF 50-55%, diastolic dysfunction   Family history of coronary artery disease 01/17/2019   Father and two brothers with early CAD   Family history of heart disease    Hyperlipidemia    Hypertension    Leriche syndrome (HCC)    Normocytic anemia 11/28/2016   Palpitations  06/08/2016   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 Some claudication but she declines further work up. ABis moderately decreased March 2020   Pulmonary emboli (HCC) 07/07/2019   Acute pulmonary lesion    Past Surgical History:  Procedure Laterality Date   ABI - BILATERAL  2008   moderate arterial insufficiency at rest; pulsatile flow of bilateral ankle PVRs   APPENDECTOMY  11/21/2002   acute appendicitis   LEFT HEART CATHETERIZATION WITH CORONARY ANGIOGRAM N/A 11/10/2011   Procedure: LEFT HEART CATHETERIZATION WITH CORONARY ANGIOGRAM;  Surgeon: Runell Gess, MD;  Location[facility] CATH LAB;  Service: Cardiovascular;  Laterality: N/A; - normal left main/LAD/L Cfx/RCA   NM MYOCAR PERF WALL MOTION  10/2011   lexiscan - EF71%, normal perfusion; low risk scan   ROTATOR CUFF REPAIR  1995   TRANSTHORACIC ECHOCARDIOGRAM  10/2011   EF=>55%, calcified moderator band in the RV; borderline LA enlargement; mild MR; mild TR; trace AV regurg & pulm valve regurg    Family Psychiatric History:   Family History:  Family History  Problem Relation Age of Onset   Dementia Mother    Heart disease Father    Hypertension Father    Cancer Father    Heart attack Father        in 26s   Stroke Sister    Breast cancer Sister 32   Hypertension Sister    Hypertension Brother        2 with CABG   Heart Problems Child        born with enlarged heart    Social History:   Social History   Socioeconomic History   Marital status: Married    Spouse name: Not on file   Number of children: 1   Years of education: 12   Highest education level: Not on file  Occupational History   Not on file  Tobacco Use   Smoking status: Former    Current packs/day: 0.00    Average packs/day: 0.5 packs/day for 23.0 years (11.5 ttl pk-yrs)    Types: Cigarettes    Start date: 1966    Quit date: 1989    Years since  quitting: 36.2   Smokeless tobacco: Never   Tobacco comments:    Started smoking at 63 and stopped 26 started again at 36 stopped again at 79   Substance and Sexual Activity   Alcohol use: Yes    Comment: occasional wine   Drug use: No   Sexual activity: Not Currently  Other Topics Concern   Not on file  Social History Narrative   Left Handed    Lives in a 2 story with a basement. Lives with husband    Social Drivers of Corporate investment banker Strain: Not on [employer]  Insecurity: Not on file  Transportation Needs: Not on file  Physical Activity: Not on file  Stress: Not on file  Social Connections: Not on file    Additional Social History:   Allergies:   Allergies  Allergen Reactions   Amoxicillin Swelling   Ramipril Swelling    Angioedema     Metabolic Disorder Labs: No results found for: "HGBA1C", "MPG" No results found for: "PROLACTIN" No results found for: "CHOL", "TRIG", "HDL", "CHOLHDL", "VLDL", "LDLCALC" Lab Results  Component Value Date   TSH 1.810 03/25/2022    Therapeutic Level Labs: No results found for: "LITHIUM" No results found for: "CBMZ" No results found for: "VALPROATE"  Current Medications: Current Outpatient Medications  Medication Sig Dispense Refill   amLODipine (NORVASC) 10 MG tablet Take 10 mg by mouth daily.     Coenzyme Q-10 200 MG CAPS Take 200 mg by mouth daily.     DEXILANT 60 MG capsule Take 60 mg by mouth daily.  0   diazepam (VALIUM) 5 MG tablet Take 2.5 mg by mouth daily.      ELIQUIS 5 MG TABS tablet TAKE 1 TABLET BY MOUTH TWICE DAILY 180 tablet 1   hydrochlorothiazide (HYDRODIURIL) 12.5 MG tablet Take 12.5 mg by mouth daily.     metoprolol tartrate (LOPRESSOR) 25 MG tablet Take 1.5 tablets (37.5 mg total) by mouth 2 (two) times daily. NEED OV. 2ND ATTEMPT 90 tablet 0   PRALUENT 150 MG/ML SOAJ Inject into the skin.     rosuvastatin (CRESTOR) 40 MG tablet Take 40 mg by mouth daily.     Vitamin D, Ergocalciferol,  (DRISDOL) 50000 units CAPS capsule Take 50,000 Units by mouth every Wednesday.   2   albuterol (VENTOLIN HFA) 108 (90 Base) MCG/ACT inhaler Inhale 2 puffs into the lungs every 6 (six) hours as needed for wheezing or shortness of breath. (Patient not taking: Reported on 07/20/2023) 18 g 1   metFORMIN (GLUCOPHAGE) 500 MG tablet Take 500 mg by mouth daily. prn (Patient not taking: Reported on 07/20/2023)     No current facility-administered medications for this visit.    Musculoskeletal: Strength & Muscle Tone: within normal limits Gait & Station: normal Patient leans: N/A  Psychiatric Specialty Exam: Review of Systems  Blood pressure 126/77, pulse (!) 52, height 5\' 2"  (1.575 m), weight 126 lb (57.2 kg).Body mass index is 23.05 kg/m.  General Appearance: Casual  Eye Contact:  Good  Speech:  Clear and Coherent  Volume:  Normal  Mood:  Anxious  Affect:  Congruent  Thought Process:  Goal Directed  Orientation:  Full (Time, Place, and Person)  Thought Content:  WDL  Suicidal Thoughts:  No  Homicidal Thoughts:  No  Memory:  NA  Judgement:  Good  Insight:  Good  Psychomotor Activity:    Concentration:  Concentration: Fair  Recall:  Good  Fund of Knowledge:Good  Language: Good  Akathisia:  No  Handed:  Right  AIMS (if indicated):  not done  Assets:  Desire for Improvement  ADL's:  Intact  Cognition: WNL  Sleep:  Fair   Screenings: Flowsheet Row ED from 01/11/2022 in [facility] at [facility] ED from 04/08/2021 in [facility] Emergency Department at [facility] ED from 12/16/2020 in [facility] Emergency Department at [facility]  C-SSRS RISK CATEGORY No Risk No Risk No Risk       Assessment and Plan:   At this time the patient's presentation is somewhat complex.  The most prevalent symptom seems to be anxiety however she does describe persistent daily depression for years.  I think the patient is anxious because she is not sure why  she is having the experiences of jumping out of bed and finding herself in her home and not knowing how she got there.  She has had some vague features of hallucinations.  The patient is not suicidal.  She has no overt psychosis that it impairs her functioning.  On the other hand the patient is showing significant dysfunction.  This is characterized by not wanting to get out of bed and get dressed take a bath.  She sounds like she is isolating herself.  She has equivocal symptoms of generalized anxiety disorder.  What might be significant is the fact that she is taking a small dose of Valium and she suggests that it is helpful.  I think it is important to have a reevaluation with her husband.  The possibility of switching her from Valium to Ativan twice a day is not out of the question.  However it is important noted that tentatively defined a psychosocial stressor.  The other consideration is the fact that there is an organic neurological condition present.  On her next visit we will do more of a neurological symptoms assessment.  We will clarify if she has ever seen a neurologist.  We will clarify if she has ever had a brain scan.  The progression of this condition is also reported to define with her husband.  The patient implies that 5 or 6 years ago she was much better now she is worse than she has ever been.  I want to clarify the story of her weight loss and gain.  I want to better clarify her level of functioning.  She will return to see me in approximately 5 to 7 weeks at her next opening.  At that time she will bring her husband.  Collaboration of Care:   Patient/Guardian was advised Release of Information must be obtained prior to any record release in order to collaborate their care with an outside provider. Patient/Guardian was advised if they have not already done so to contact the registration department to sign all necessary forms in order for [place] to release information regarding their care.    Consent: Patient/Guardian gives verbal consent for treatment and assignment of benefits for services provided during this visit. Patient/Guardian expressed understanding and agreed to proceed.   Gypsy Balsam, MD 4/9/20254:12 PM

## 2023-07-27 ENCOUNTER — Encounter

## 2023-07-27 DIAGNOSIS — R5382 Chronic fatigue, unspecified: Secondary | ICD-10-CM

## 2023-08-06 ENCOUNTER — Telehealth: Payer: Self-pay | Admitting: Pulmonary Disease

## 2023-08-06 DIAGNOSIS — R0683 Snoring: Secondary | ICD-10-CM | POA: Diagnosis not present

## 2023-08-06 NOTE — Telephone Encounter (Signed)
 Call patient  Sleep study result  Date of study: 07/27/2023  Impression: Negative for significant sleep disordered breathing with an AHI of 1.2  Recommendation: Study is not consistent with presence of significant sleep disordered breathing  Clinical follow-up of symptoms

## 2023-08-09 ENCOUNTER — Ambulatory Visit: Payer: Self-pay

## 2023-08-09 NOTE — Telephone Encounter (Signed)
ATC x1 LVM for patient to call  our office back regarding sleep study result's.  

## 2023-08-09 NOTE — Telephone Encounter (Signed)
  Chief Complaint: info only/results Additional Notes: Triager relayed sleep study results from 07/27/2023. Patient verbalized understanding and had no further questions.   Additionally, pt would like update on rheumatology referral. Please advise.    Copied from CRM [id number]. Topic: Clinical - Lab/Test Results >> Aug 09, 2023  2:46 PM Juliana Ocean wrote: Reason for CRM: pt calling for leep study results Reason for Disposition  [1] Caller requesting NON-URGENT health information AND [2] PCP's office is the best resource  Answer Assessment - Initial Assessment Questions 1. REASON FOR CALL or QUESTION: "What is your reason for calling today?" or "How can I best help you?" or "What question do you have that I can help answer?"     Pt calling to review sleep study results. Triager relayed information by E. Sueanne Emerald, NP:  Home sleep study was negative for sleep disordered breathing, no significant apneas. No dx OSA Schedule visit if needs to discuss results further. Telephone note    Of note, pt inquired/checking on status of rheumatology referral that Sueanne Emerald, NP made in March.  Protocols used: Information Only Call - No Triage-A-AH

## 2023-08-15 NOTE — Telephone Encounter (Signed)
 Spoke with patient regarding sleep study result's .   Sleep study result   Date of study: 07/27/2023   Impression: Negative for significant sleep disordered breathing with an AHI of 1.2   Recommendation: Study is not consistent with presence of significant sleep disordered breathing   Clinical follow-up of symptoms  Patient's voice was understanding. Nothing else further needed.

## 2023-08-19 ENCOUNTER — Other Ambulatory Visit (HOSPITAL_BASED_OUTPATIENT_CLINIC_OR_DEPARTMENT_OTHER): Payer: Self-pay | Admitting: Cardiovascular Disease

## 2023-08-19 DIAGNOSIS — R002 Palpitations: Secondary | ICD-10-CM

## 2023-08-31 ENCOUNTER — Ambulatory Visit (HOSPITAL_BASED_OUTPATIENT_CLINIC_OR_DEPARTMENT_OTHER): Admitting: Psychiatry

## 2023-08-31 VITALS — BP 146/72 | HR 54 | Ht 62.5 in | Wt 124.0 lb

## 2023-08-31 DIAGNOSIS — F320 Major depressive disorder, single episode, mild: Secondary | ICD-10-CM

## 2023-08-31 MED ORDER — BUPROPION HCL ER (XL) 150 MG PO TB24: 150.0000 mg | ORAL_TABLET | Freq: Every day | ORAL | 3 refills | Status: DC

## 2023-08-31 NOTE — Progress Notes (Signed)
 Psychiatric Initial Adult Assessment   Patient Identification: Hannah [name] MRN:  [id number] Date of Evaluation:  08/31/2023 Referral Source:  Chief Complaint:   Visit Diagnosis:   History of Present Illness:   Today patient seems to be no better.  We have asked her to bring her husband but he refused.  He somehow thought that he was being evaluated or the marital treatment was going to be considered.  I shared with her that all I wanted is his perception.  The patient is medically stable.  She does take Eliquis  that she has had a pulmonary embolus in the past.  Patient says she has no significant stresses at all.  The patient says financially they are doing fine and she believes they are getting along well in the marriage.  She has 1 son some grandchildren all of whom are doing well.  Today the the patient shares that she feels persistent daily depression.  He feels the pressure on her shoulders pushing her down.  Her sleep is abnormal.  It is not clear how long it has been abnormal but she has had going to bed about 3 or 4 in the morning and sleeping only 4 hours.  Her appetite is reduced her energy level was very low her ability to think and concentrate I think is impaired.  She still enjoying reading but she has a hard time getting going.  She says she does not want to take a pass she does not want to get dressed she does not want to come out of the house.  Her husband is upset with her because she he wants to travel and do things and she is on movable.  The patient has never had an MRI. Associated Signs/Symptoms: Depression Symptoms: Daily depression (Hypo) Manic Symptoms:   Anxiety Symptoms:  Excessive Worry, Psychotic Symptoms:   PTSD Symptoms: NA  Past Psychiatric History: Minimal  Previous Psychotropic Medications: Yes   Substance Abuse History in the last 12 months:  No.  Consequences of Substance Abuse: NA  Past Medical History:  Past Medical History:  Diagnosis Date    Acute pulmonary embolism (HCC) 07/07/2019   Chest pain with normal coronary angiography 08/09/2014   Cath 2013   Dyslipidemia 06/25/2013   Zetia added Feb 2020- PCP follows   Essential hypertension 06/25/2013   Echo June 2020- EF 50-55%, diastolic dysfunction   Family history of coronary artery disease 01/17/2019   Father and two brothers with early CAD   Family history of heart disease    Hyperlipidemia    Hypertension    Leriche syndrome (HCC)    Normocytic anemia 11/28/2016   Palpitations 06/08/2016   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 Some claudication but she declines further work up. ABis moderately decreased March 2020   Pulmonary emboli (HCC) 07/07/2019   Acute pulmonary lesion    Past Surgical History:  Procedure Laterality Date   ABI - BILATERAL  2008   moderate arterial insufficiency at rest; pulsatile flow of bilateral ankle PVRs   APPENDECTOMY  11/21/2002   acute appendicitis   LEFT HEART CATHETERIZATION WITH CORONARY ANGIOGRAM N/A 11/10/2011   Procedure: LEFT HEART CATHETERIZATION WITH CORONARY ANGIOGRAM;  Surgeon: Avanell Leigh, MD;  Location: [facility] CATH LAB;  Service: Cardiovascular;  Laterality: N/A; - normal left main/LAD/L Cfx/RCA   NM MYOCAR PERF WALL MOTION  10/2011   lexiscan - EF71%,  normal perfusion; low risk scan   ROTATOR CUFF REPAIR  1995   TRANSTHORACIC ECHOCARDIOGRAM  10/2011   EF=>55%, calcified moderator band in the RV; borderline LA enlargement; mild MR; mild TR; trace AV regurg & pulm valve regurg    Family Psychiatric History:   Family History:  Family History  Problem Relation Age of Onset   Dementia Mother    Heart disease Father    Hypertension Father    Cancer Father    Heart attack Father        in 32s   Stroke Sister    Breast cancer Sister 41   Hypertension Sister    Hypertension Brother        2 with CABG   Heart Problems Child         born with enlarged heart    Social History:   Social History   Socioeconomic History   Marital status: Married    Spouse name: Not on file   Number of children: 1   Years of education: 12   Highest education level: Not on file  Occupational History   Not on file  Tobacco Use   Smoking status: Former    Current packs/day: 0.00    Average packs/day: 0.5 packs/day for 23.0 years (11.5 ttl pk-yrs)    Types: Cigarettes    Start date: 1966    Quit date: 1989    Years since quitting: 36.4   Smokeless tobacco: Never   Tobacco comments:    Started smoking at 52 and stopped 26 started again at 36 stopped again at 72   Substance and Sexual Activity   Alcohol use: Yes    Comment: occasional wine   Drug use: No   Sexual activity: Not Currently  Other Topics Concern   Not on file  Social History Narrative   Left Handed    Lives in a 2 story with a basement. Lives with husband    Social Drivers of Corporate investment banker Strain: Not on file  Food Insecurity: Not on file  Transportation Needs: Not on file  Physical Activity: Not on file  Stress: Not on file  Social Connections: Not on file    Additional Social History:   Allergies:   Allergies  Allergen Reactions   Amoxicillin Swelling   Ramipril Swelling    Angioedema     Metabolic Disorder Labs: No results found for: "HGBA1C", "MPG" No results found for: "PROLACTIN" No results found for: "CHOL", "TRIG", "HDL", "CHOLHDL", "VLDL", "LDLCALC" Lab Results  Component Value Date   TSH 1.810 03/25/2022    Therapeutic Level Labs: No results found for: "LITHIUM" No results found for: "CBMZ" No results found for: "VALPROATE"  Current Medications: Current Outpatient Medications  Medication Sig Dispense Refill   buPROPion (WELLBUTRIN XL) 150 MG 24 hr tablet Take 1 tablet (150 mg total) by mouth daily. 30 tablet 3   albuterol  (VENTOLIN  HFA) 108 (90 Base) MCG/ACT inhaler Inhale 2 puffs into the lungs every 6  (six) hours as needed for wheezing or shortness of breath. (Patient not taking: Reported on 07/20/2023) 18 g 1   amLODipine  (NORVASC ) 10 MG tablet Take 10 mg by mouth daily.     Coenzyme Q-10 200 MG CAPS Take 200 mg by mouth daily.     DEXILANT 60 MG capsule Take 60 mg by mouth daily.  0   diazepam  (VALIUM ) 5 MG tablet Take 2.5 mg by mouth daily.      ELIQUIS  5 MG  TABS tablet TAKE 1 TABLET BY MOUTH TWICE DAILY 180 tablet 1   hydrochlorothiazide  (HYDRODIURIL ) 12.5 MG tablet Take 12.5 mg by mouth daily.     metFORMIN (GLUCOPHAGE) 500 MG tablet Take 500 mg by mouth daily. prn (Patient not taking: Reported on 07/20/2023)     metoprolol  tartrate (LOPRESSOR ) 25 MG tablet Take 1.5 tablets (37.5 mg total) by mouth 2 (two) times daily. 45 tablet 0   PRALUENT 150 MG/ML SOAJ Inject into the skin.     rosuvastatin  (CRESTOR ) 40 MG tablet Take 40 mg by mouth daily.     Vitamin D, Ergocalciferol, (DRISDOL) 50000 units CAPS capsule Take 50,000 Units by mouth every Wednesday.   2   No current facility-administered medications for this visit.    Musculoskeletal: Strength & Muscle Tone: within normal limits Gait & Station: normal Patient leans: N/A  Psychiatric Specialty Exam: Review of Systems  Blood pressure (!) 146/72, pulse (!) 54, height 5' 2.5" (1.588 m), weight 124 lb (56.2 kg).Body mass index is 22.32 kg/m.  General Appearance: Casual  Eye Contact:  Good  Speech:  Clear and Coherent  Volume:  Normal  Mood:  Anxious  Affect:  Congruent  Thought Process:  Goal Directed  Orientation:  Full (Time, Place, and Person)  Thought Content:  WDL  Suicidal Thoughts:  No  Homicidal Thoughts:  No  Memory:  NA  Judgement:  Good  Insight:  Good  Psychomotor Activity:    Concentration:  Concentration: Fair  Recall:  Good  Fund of Knowledge:Good  Language: Good  Akathisia:  No  Handed:  Right  AIMS (if indicated):  not done  Assets:  Desire for Improvement  ADL's:  Intact  Cognition: WNL  Sleep:   Fair   Screenings: Flowsheet Row ED from 01/11/2022 in [facility] Emergency Department at [facility] ED from 04/08/2021 in [facility] Emergency Department at [facility] ED from 12/16/2020 in [facility] Emergency Department at [facility]  C-SSRS RISK CATEGORY No Risk No Risk No Risk       Assessment and Plan:    At this time my diagnosis is the patient is having a major depressive episode mild.  She will begin on Wellbutrin 150 mg 1 in the morning.  We talked about Zoloft Lexapro and other medicines she claims that her sisters have been on all these medicines and had troubles.  He had nightmares.  One of her sisters however is taking Wellbutrin and is tolerating it without a problem.  Therefore we will go ahead and begin her on a low dose of Wellbutrin see her back in about 7 weeks and again invite her husband to come in.  The purpose of him coming in is to get his perception and to assess if there were any other psychosocial stressors that are not apparent.  The patient is miserable and she is not functioning well.  She is not suicidal.  Collaboration of Care:   Patient/Guardian was advised Release of Information must be obtained prior to any record release in order to collaborate their care with an outside provider. Patient/Guardian was advised if they have not already done so to contact the registration department to sign all necessary forms in order for us  to release information regarding their care.   Consent: Patient/Guardian gives verbal consent for treatment and assignment of benefits for services provided during this visit. Patient/Guardian expressed understanding and agreed to proceed.   Delorse Fey, MD 5/21/20252:47 PM

## 2023-09-03 ENCOUNTER — Other Ambulatory Visit (HOSPITAL_BASED_OUTPATIENT_CLINIC_OR_DEPARTMENT_OTHER): Payer: Self-pay | Admitting: Cardiovascular Disease

## 2023-09-03 DIAGNOSIS — R002 Palpitations: Secondary | ICD-10-CM

## 2023-09-13 ENCOUNTER — Telehealth: Payer: Self-pay | Admitting: Primary Care

## 2023-09-13 ENCOUNTER — Ambulatory Visit (INDEPENDENT_AMBULATORY_CARE_PROVIDER_SITE_OTHER): Admitting: Primary Care

## 2023-09-13 ENCOUNTER — Encounter: Payer: Self-pay | Admitting: Primary Care

## 2023-09-13 VITALS — BP 138/68 | HR 54 | Temp 97.7°F | Ht 62.0 in | Wt 123.8 lb

## 2023-09-13 DIAGNOSIS — F32A Depression, unspecified: Secondary | ICD-10-CM | POA: Diagnosis not present

## 2023-09-13 DIAGNOSIS — G4752 REM sleep behavior disorder: Secondary | ICD-10-CM

## 2023-09-13 DIAGNOSIS — G4700 Insomnia, unspecified: Secondary | ICD-10-CM

## 2023-09-13 DIAGNOSIS — R5382 Chronic fatigue, unspecified: Secondary | ICD-10-CM

## 2023-09-13 DIAGNOSIS — Z87891 Personal history of nicotine dependence: Secondary | ICD-10-CM | POA: Diagnosis not present

## 2023-09-13 MED ORDER — TRAZODONE HCL 50 MG PO TABS: 50.0000 mg | ORAL_TABLET | Freq: Every evening | ORAL | 1 refills | Status: DC | PRN

## 2023-09-13 NOTE — Telephone Encounter (Signed)
 Inquiring why rheumatology referral was denied, referred for CFS and possible raynaud's

## 2023-09-13 NOTE — Telephone Encounter (Signed)
 Thank you :)

## 2023-09-13 NOTE — Patient Instructions (Addendum)
-  INSOMNIA: Insomnia is a condition where you have trouble falling or staying asleep. Your sleep study showed no significant sleep apnea. We will start you on trazodone 25 mg at bedtime, beginning with half a tablet for three nights. If you tolerate it well, you can increase to a full tablet. Avoid driving or drinking alcohol while on trazodone. You should stop taking diazepam  unless you need it for panic attacks.  -DEPRESSION: Depression is a mood disorder that causes persistent feelings of sadness and loss of interest. You are currently taking Wellbutrin  150 mg to help improve your mood and energy levels.  -ANXIETY: Anxiety is a feeling of worry or fear that can be intense. You are taking diazepam  2.5 mg during the day. We discussed the possibility of stopping diazepam  due to dependency and side effects. You can try tapering off by taking it every other day for a week, then stop if you don't need it for panic attacks.  -GENERAL HEALTH MAINTENANCE: We need to address the insurance denial for your rheumatology referral. Please check your insurance status, and we will readdress the referral and investigate the denial.  INSTRUCTIONS: Please follow up on your insurance status regarding the rheumatology referral. We will readdress this issue and investigate the denial at your next visit.  Follow-up 6 weeks with Jerlene Moody NP to review insomnia management

## 2023-09-13 NOTE — Progress Notes (Signed)
 @Patient  ID: Hannah [name], female    DOB: [birth year], 78 y.o.   MRN: [id number]  No chief complaint on file.   Referring provider: Edda Goo, MD  HPI: 78 year old female, smoker quit 1989 (11.5-pack-year history).  Past medical history significant for hypertension, acute pulmonary embolism, peripheral artery disease, dyslipidemia.  Patient of Dr. Matilde Son.  Previous [facility] pulmonary encounter:  03/28/2023 Discussed the use of AI scribe software for clinical note transcription with the patient, who gave verbal consent to proceed.  Patient presents today for 30-month follow-up.  She was last seen by Dr. Matilde Son in June for progressive dyspnea symptoms with restrictive defect on PFTs.  CT chest was unremarkable.  She was seen by neurology and exam was normal.  Acetylcholine receptor AB and B12 level was normal.  Dr. Carlyle Childes last note dyspnea felt to be related to deconditioning and poor after during testing maneuvers, exact cause of dyspnea unclear.  She might also need further assessment for anxiety. Arrange for cardio exercise test.  Follows with Dr. Dean Every with cardiology.  On indefinite anticoagulation through her [facility] and cardiology for history of pulmonary embolism.  The patient underwent a cardiac stress test, which showed no evidence of cardiopulmonary abnormality. The test suggested mild functional impairment compared to age-matched sedentary norms, indicating potential deconditioning.  The patient has been experiencing depression and anxiety, which has led to decreased physical activity and potential deconditioning. She has recently received samples of an unspecified medication for depression from her primary care provider, but has not yet started taking it.  The patient also reports swelling in her fingertips, but neurological examination and testing, including acetylcholine receptor testing and B12 levels, were normal. The patient has previously participated in a friend group exercise  program via Zoom, but this was discontinued due to the illness of a group member.      Shortness of Breath No clear cardiopulmonary cause identified. Pulmonary function tests showed some restriction, possibly due to muscle weakness or poor effort during testing. No evidence of COPD or pulmonary fibrosis. Cardiac stress test showed no evidence of cardiopulmonary abnormality. Likely due to deconditioning. -Refer to pulmonary rehab program at [facility] for structured exercise program. If not covered by insurance, consider [facility] or home exercise program.  Depression Patient reports significant depression and anxiety, impacting motivation for physical activity. Primary care doctor has provided samples of medication for depression, but patient has not started yet. -Follow up with primary care doctor regarding effectiveness of depression medication. -Consider referral to a psychiatrist if symptoms do not improve with medication from primary care doctor.  06/13/2023 Discussed the use of AI scribe software for clinical note transcription with the patient, who gave verbal consent to proceed.  History of Present Illness   Hannah [name] is a 78 year old female who presents with shortness of breath for evaluation of unclear cardiopulmonary cause.  She experiences persistent shortness of breath with no identified cardiopulmonary cause. Previous evaluations, including pulmonary function tests and cardiac assessments, have not revealed any heart or lung abnormalities. A CT scan of the chest was negative for interstitial lung disease or fibrosis. There is some coronary artery calcification and a mildly enlarged heart, but stress tests showed no cardiopulmonary abnormalities. She attributes some of her symptoms to deconditioning and has been using a pedal exercise machine at home regularly.  She experiences significant fatigue, which limits her daily activities such as  housework and laundry. She has consulted a neurologist for weakness, and  lab work was negative for myasthenia gravis, with normal B12 levels. She reports finger swelling and redness, particularly in the mornings. She has a family history of lupus.  She reports sleep disturbances, including sleepwalking and acting out dreams. A previous sleep study was inconclusive due to lack of sleep. She has not tried a home sleep study. Her sleep schedule is irregular, often going to bed at 3 or 4 AM and waking up around 8:30 AM, with difficulty maintaining a consistent sleep routine. She wakes up every two hours during the night. She has tried melatonin but did not like the side effects from the medication.   She has not started medication for depression and anxiety provided by her primary care provider, preferring further evaluation. She also reports OCD and sleepwalking episodes. She has not used melatonin recently due to adverse effects in the past.      1. Chronic fatigue (Primary) - Ambulatory referral to Rheumatology - Ambulatory referral to Psychiatry - Home sleep test; Future   2. Depression, unspecified depression type - Ambulatory referral to Psychiatry   3. Other obsessive-compulsive disorders - Ambulatory referral to Psychiatry        Shortness of Breath No identified cardiopulmonary cause. Restrictive pattern on PFTs possibly due to muscle weakness or poor effort. No evidence of COPD or pulmonary fibrosis on PFTs or CT chest. Coronary artery disease and aortic calcification noted on CT chest, but heart not significantly enlarged. Stress test negative for cardiopulmonary abnormality. Likely related to deconditioning and muscle weakness. -Encourage regular exercise, aiming for at least 10-20 minutes three times a week. -Consider local gym facilities.   Chronic Fatigue Possible underlying rheumatological cause given family history of lupus and peripheral symptoms in distal fingers. No evidence  of myasthenia gravis or B12 deficiency. -Refer to Rheumatology for further evaluation CFS.   Depression and Anxiety Impacting motivation for physical activity. Patient has not started medication samples provided by primary care physician and prefers further evaluation. -Refer to Psychiatry for further evaluation and management.   Sleep Disturbances Reports of sleepwalking and acting out dreams. Irregular sleep schedule with late bedtime and frequent awakenings. Family history of sleep apnea. -Order home sleep study to rule out sleep apnea. -Encourage regular sleep schedule with a consistent bedtime.   Peripheral Finger Changes Reports of swelling and redness in distal fingers, particularly in the morning. No reported pain or cold sensitivity. -Refer to Rheumatology for further evaluation.     09/13/2023- Interim hx  Discussed the use of AI scribe software for clinical note transcription with the patient, who gave verbal consent to proceed.  History of Present Illness   Abigaelle Verley is a 78 year old female with chronic fatigue syndrome, depression, and insomnia who presents for follow-up regarding chronic fatigue, depression, and insomnia.  She has chronic fatigue and concerns about Raynaud's phenomenon, noting swelling and redness in her distal fingertips. She was previously referred to rheumatology for chronic fatigue syndrome, but the referral was denied by her insurance.  She underwent a sleep study, which showed an apnea score of 1.2 events per hour and no oxygen desaturation. Despite this, she continues to experience insomnia, having trouble both falling and staying asleep. She sometimes does not sleep until 6 AM, which affects her daily functioning.  She is currently taking Wellbutrin  150 mg for depression, which is new to her regimen, and diazepam  2.5 mg for anxiety, typically taken around 3:30 PM. She has a history of REM sleep disorder, characterized by sleepwalking and  acting  out dreams, which was previously managed with clonazepam. However, she discontinued clonazepam under the direction of her doctor due to adverse effects and has been using diazepam  instead. She last took clonazepam 5 years ago and is hesitant to retry.   She experiences nightmares and sleepwalking episodes approximately twice a week, with near accidents such as almost falling down stairs. She is concerned about the potential for these episodes to worsen.  In terms of her anxiety management, she has attempted to discontinue diazepam  in the past but experienced withdrawal symptoms, leading her to resume a half tablet daily. She is uncertain about the continuation of her diazepam  prescription.     No underlying cardiopulmonary disease has been identified. Pulmonary function tests showed some restriction, possibly due to muscle weakness or poor effort during testing. No evidence of COPD or pulmonary fibrosis. Cardiac stress test showed no evidence of cardiopulmonary abnormality. Dyspnea felt likely due to deconditioning. Encourage regular exercise, aiming for at least 20 minutes three times a week.  Cardiac testing 12/22/22 Cardiopulmonary stress test>> Conclusion: The interpretation of this test is limited due to submaximal effort during the exercise. Based on available data, exercise testing with gas exchange demonstrates mild functional impairment when compared to matched sedentary norms. There is no evidence for cardiopulmonary abnormality. Patient would likely benefit from routine, supervised exercise program.  Pulmonary function testing 07/23/22 PFTs>> FVC 1.45 (57%), FEV1 1.32 (69%), ratio 91 Spirometry suggestive of restrictive defect, pattern suggestive of respiratory muscle weakness or suboptimal effort.  Patient was unable to perform diffusion capacity test maneuver and opted against bronchodilator challenge.  Imaging 08/28/22 HRCT>> No evidence of interstitial lung disease. Negative for  subpleural reticulation, traction bronchiectasis/bronchiolectasis, ground-glass, architectural distortion or honeycombing. Minimal scattered pulmonary parenchymal scarring. No air trapping. No pleural fluid. Airway is unremarkable.  Aortic atherosclerosis (ICD10-I70.0).   Allergies  Allergen Reactions   Amoxicillin Swelling   Ramipril Swelling    Angioedema     Immunization History  Administered Date(s) Administered   Fluad Trivalent(High Dose 65+) 02/07/2023   PFIZER(Purple Top)SARS-COV-2 Vaccination 06/03/2019, 06/27/2019    Past Medical History:  Diagnosis Date   Acute pulmonary embolism (HCC) 07/07/2019   Chest pain with normal coronary angiography 08/09/2014   Cath 2013   Dyslipidemia 06/25/2013   Zetia added Feb 2020- PCP follows   Essential hypertension 06/25/2013   Echo June 2020- EF 50-55%, diastolic dysfunction   Family history of coronary artery disease 01/17/2019   Father and two brothers with early CAD   Family history of heart disease    Hyperlipidemia    Hypertension    Leriche syndrome (HCC)    Normocytic anemia 11/28/2016   Palpitations 06/08/2016   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 Some claudication but she declines further work up. ABis moderately decreased March 2020   Pulmonary emboli (HCC) 07/07/2019   Acute pulmonary lesion    Tobacco History: Social History   Tobacco Use  Smoking Status Former   Current packs/day: 0.00   Average packs/day: 0.5 packs/day for 23.0 years (11.5 ttl pk-yrs)   Types: Cigarettes   Start date: 1966   Quit date: 1989   Years since quitting: 36.4  Smokeless Tobacco Never  Tobacco Comments   Started smoking at 28 and stopped 26 started again at 36 stopped again at 36    Counseling given: Not Answered Tobacco comments: Started smoking at 16 and  stopped 26 started again at 36 stopped again at 66    Outpatient  Medications Prior to Visit  Medication Sig Dispense Refill   albuterol  (VENTOLIN  HFA) 108 (90 Base) MCG/ACT inhaler Inhale 2 puffs into the lungs every 6 (six) hours as needed for wheezing or shortness of breath. (Patient not taking: Reported on 07/20/2023) 18 g 1   amLODipine  (NORVASC ) 10 MG tablet Take 10 mg by mouth daily.     buPROPion  (WELLBUTRIN  XL) 150 MG 24 hr tablet Take 1 tablet (150 mg total) by mouth daily. 30 tablet 3   Coenzyme Q-10 200 MG CAPS Take 200 mg by mouth daily.     DEXILANT 60 MG capsule Take 60 mg by mouth daily.  0   diazepam  (VALIUM ) 5 MG tablet Take 2.5 mg by mouth daily.      ELIQUIS  5 MG TABS tablet TAKE 1 TABLET BY MOUTH TWICE DAILY 180 tablet 1   hydrochlorothiazide  (HYDRODIURIL ) 12.5 MG tablet Take 12.5 mg by mouth daily.     metFORMIN (GLUCOPHAGE) 500 MG tablet Take 500 mg by mouth daily. prn (Patient not taking: Reported on 07/20/2023)     metoprolol  tartrate (LOPRESSOR ) 25 MG tablet Take 1.5 tablets (37.5 mg total) by mouth 2 (two) times daily. 45 tablet 0   PRALUENT 150 MG/ML SOAJ Inject into the skin.     rosuvastatin  (CRESTOR ) 40 MG tablet Take 40 mg by mouth daily.     Vitamin D, Ergocalciferol, (DRISDOL) 50000 units CAPS capsule Take 50,000 Units by mouth every Wednesday.   2   No facility-administered medications prior to visit.   Review of Systems  Review of Systems  Constitutional:  Positive for fatigue.  HENT: Negative.    Respiratory: Negative.    Cardiovascular: Negative.   Psychiatric/Behavioral:  Positive for sleep disturbance. The patient is nervous/anxious.    Physical Exam  There were no vitals taken for this visit. Physical Exam Constitutional:      General: She is not in acute distress.    Appearance: Normal appearance. She is not ill-appearing.  HENT:     Head: Normocephalic and atraumatic.     Mouth/Throat:     Mouth: Mucous membranes are moist.     Pharynx: Oropharynx is clear.  Cardiovascular:     Rate and Rhythm: Normal  rate and regular rhythm.  Pulmonary:     Effort: Pulmonary effort is normal.     Breath sounds: Normal breath sounds. No wheezing, rhonchi or rales.  Skin:    General: Skin is warm and dry.  Neurological:     General: No focal deficit present.     Mental Status: She is alert and oriented to person, place, and time. Mental status is at baseline.  Psychiatric:        Mood and Affect: Mood normal.        Behavior: Behavior normal.        Thought Content: Thought content normal.        Judgment: Judgment normal.      Lab Results:  CBC    Component Value Date/Time   WBC 5.0 05/20/2022 1539   WBC 4.5 01/11/2022 1800   RBC 4.63 05/20/2022 1539   RBC 4.58 01/11/2022 1800   HGB 12.9 05/20/2022 1539   HCT 39.6 05/20/2022 1539   PLT 363 05/20/2022 1539   MCV 86 05/20/2022 1539   MCH 27.9 05/20/2022 1539   MCH 28.6 01/11/2022 1800   MCHC 32.6 05/20/2022 1539   MCHC 32.6  01/11/2022 1800   RDW 12.7 05/20/2022 1539   LYMPHSABS 1.7 05/20/2022 1539   MONOABS 0.4 12/16/2020 1742   EOSABS 0.0 05/20/2022 1539   BASOSABS 0.0 05/20/2022 1539    BMET    Component Value Date/Time   NA 143 06/01/2022 1156   K 3.8 06/01/2022 1156   CL 105 06/01/2022 1156   CO2 25 06/01/2022 1156   GLUCOSE 90 06/01/2022 1156   GLUCOSE 120 (H) 01/11/2022 1800   BUN 14 06/01/2022 1156   CREATININE 0.93 06/01/2022 1156   CALCIUM  8.9 06/01/2022 1156   GFRNONAA >60 01/11/2022 1800   GFRAA >60 08/01/2019 2117    BNP    Component Value Date/Time   BNP 25.7 07/07/2019 1617    ProBNP No results found for: "PROBNP"  Imaging: No results found.   Assessment & Plan:   1. Insomnia, unspecified type (Primary)  2. Chronic fatigue  3. Depression, unspecified depression type  4. REM sleep behavior disorder   Assessment and Plan    Insomnia Chronic insomnia with difficulty initiating and maintaining sleep. Recent sleep study shows no significant sleep apnea (apnea score 1.2 events/hour, no  oxygen desaturation). Wellbutrin  may contribute to sleep difficulties due to its stimulating effects. Discussed trazodone, a sedating antidepressant, for insomnia management. Potential side effects include morning grogginess or drowsiness. - Initiate trazodone 25 mg at bedtime, starting with half a tablet for three nights. If tolerated without significant grogginess, increase 50mg  tablet at bedtime. - Advise against driving or consuming alcohol while on trazodone. - Discontinue diazepam  unless needed for panic attacks.  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 Previous clonazepam treatment discontinued due to adverse effects. Discussed potential reintroduction of clonazepam at a lower dose, but she opted to try trazodone first for insomnia. Clonazepam typically suppresses REM sleep to prevent acting out dreams. Discussed risks of sleepwalking, including potential injury. - Consider reintroducing clonazepam 0.25 mg at bedtime if trazodone is ineffective and she is willing.  Depression Depression managed with Wellbutrin  150 mg, intended to improve mood and energy levels.  Anxiety Anxiety managed with diazepam  2.5 mg during the day. Discussed potential discontinuation due to dependency and side effects. Consider tapering off diazepam  if not needed for panic attacks. - Advise tapering diazepam  by taking it every other day for about a week, then attempt to discontinue.   Shortness of Breath No clear underlying cardiopulmonary cause identified. Pulmonary function tests showed some restriction, possibly due to muscle weakness or poor effort during testing. No evidence of COPD or pulmonary fibrosis. Cardiac stress test showed no evidence of cardiopulmonary abnormality. Likely due to deconditioning. -Encourage regular exercise, aiming for at least 20 minutes three times a week.   CFS/ possible raynaud's - Looking into rheumatology denial    Antonio Baumgarten, NP 09/13/2023

## 2023-09-20 NOTE — Telephone Encounter (Signed)
 Ok, we will hold off for now then. Thank you, I appreciate it

## 2023-09-22 ENCOUNTER — Other Ambulatory Visit: Payer: Self-pay | Admitting: Cardiovascular Disease

## 2023-09-22 NOTE — Telephone Encounter (Signed)
 Prescription refill request for Eliquis  received. Indication: Pe,  Pad Last office visit: Katheryne Pane 06/01/2022 Scr: 0.90 11/09/2022 Age: 78 yo  Weight: 56.2 kg    Pt overdue to see cardiologist. Msg sent to schedulers.

## 2023-10-05 ENCOUNTER — Telehealth: Payer: Self-pay

## 2023-10-05 NOTE — Telephone Encounter (Signed)
 Copied from CRM [id number]. Topic: Referral - Status >> Sep 19, 2023  9:34 AM Joesph PARAS wrote: Reason for CRM: Hannah [name] is calling in to state that they received a referral for this patient but the provider patient was referred to has declined to see patient for an unspecified reason (per provider). Requesting to send to a different location or provider at this time.   Pt is already scheduled for [name], NP for 12-08-23. NFN

## 2023-10-19 ENCOUNTER — Other Ambulatory Visit: Payer: Self-pay | Admitting: Cardiovascular Disease

## 2023-10-19 DIAGNOSIS — I2699 Other pulmonary embolism without acute cor pulmonale: Secondary | ICD-10-CM

## 2023-10-19 NOTE — Telephone Encounter (Signed)
 Eliquis  5mg  refill request received. Patient is 78 years old, weight-56.2kg, Crea-0.92 on 03/25/23 via [employer], Diagnosis-PE, and last seen by Dr. Court on 06/01/22 and pending appt with Reche Finder on 10/28/23. Dose is appropriate based on dosing criteria. Will send in refill to requested pharmacy.

## 2023-10-25 ENCOUNTER — Ambulatory Visit (HOSPITAL_COMMUNITY): Admitting: Psychiatry

## 2023-10-28 ENCOUNTER — Ambulatory Visit (HOSPITAL_BASED_OUTPATIENT_CLINIC_OR_DEPARTMENT_OTHER): Admitting: Family

## 2023-10-28 ENCOUNTER — Encounter (HOSPITAL_BASED_OUTPATIENT_CLINIC_OR_DEPARTMENT_OTHER): Payer: Self-pay | Admitting: Family

## 2023-10-28 VITALS — BP 114/58 | HR 56 | Ht 62.0 in | Wt 123.0 lb

## 2023-10-28 DIAGNOSIS — R002 Palpitations: Secondary | ICD-10-CM

## 2023-10-28 DIAGNOSIS — I471 Supraventricular tachycardia, unspecified: Secondary | ICD-10-CM

## 2023-10-28 DIAGNOSIS — R21 Rash and other nonspecific skin eruption: Secondary | ICD-10-CM

## 2023-10-28 DIAGNOSIS — I10 Essential (primary) hypertension: Secondary | ICD-10-CM

## 2023-10-28 DIAGNOSIS — I739 Peripheral vascular disease, unspecified: Secondary | ICD-10-CM

## 2023-10-28 NOTE — Patient Instructions (Addendum)
 Medication Instructions:  NO CHANGES *If you need a refill on your cardiac medications before your next appointment, please call your pharmacy*  Lab Work: NO LABS If you have labs (blood work) drawn today and your tests are completely normal, you will receive your results only by: MyChart Message (if you have MyChart) OR A paper copy in the mail If you have any lab test that is abnormal or we need to change your treatment, we will call you to review the results.  Testing/Procedures: NO TESTING  Follow-Up: At [facility], you and your health needs are our priority.  As part of our continuing mission to provide you with exceptional heart care, our providers are all part of one team.  This team includes your primary Cardiologist (physician) and Advanced Practice Providers or APPs (Physician Assistants and Nurse Practitioners) who all work together to provide you with the care you need, when you need it.  Your next appointment:   5 month(s)  Provider:   Dorn Lesches, MD   Other Instructions Reche GORMAN Finder, NP will reach out to Dr. Lesches about ultrasound vs CT scan of your abdomen.   To prevent palpitations: Make sure you are adequately hydrated.  Avoid and/or limit caffeine containing beverages like soda or tea. Exercise regularly.  Manage stress well. Some over the counter medications can cause palpitations such as Benadryl , AdvilPM, TylenolPM. Regular Advil or Tylenol  do not cause palpitations.    You have been referred to Dermatology.   Dr. Delon Lenis [facility] [address] [place], [place] [address] [phone]

## 2023-10-28 NOTE — Progress Notes (Signed)
 Cardiology Office Note   Date:  10/28/2023  ID:  [name], [name] 1945/09/26, MRN [id number] PCP: Valma Carwin, MD  [facility] Providers Cardiologist:  Dorn Lesches, MD     History of Present Illness Hannah [name] is a 78 y.o. female  with a hx of PAD, HTN, DLD, chest pain with normal angiography 2013, PE.   History of normal coronary arteries by prior catheterization in 2013.  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 Echo 09/2018 LVEF 50-55%, impaired diastolic relaxation, RV normal, mild thickening of mitral valve.  She was admitted 06/2019 for chest pain and shortness of breath and found to have PE placed on Eliquis .   Seen 03/27/2021 noting mild lifestyle limiting claudication but did not wish to pursue surgical revascularization.  She also noted occasional morning palpitations that was unchanged from prior monitor 07/2018 which revealed episodes of nonsustained VT and PSVT.  Her same doses of metoprolol , Eliquis  were continued.  At visit 03/2022 due to palpitations wore monitor with predominantly NSR and 5 short runs of SVT which were asymptomatic, Metoprolol  dose increased.   Coronary CTA 06/2022 calcium  score of 0. Echo 06/2022 normal LVEF, gr1dd.    Discussed the use of AI scribe software for clinical note transcription with the patient, who gave verbal consent to proceed.  History of Present Illness Hannah [name] is a 78 year old female with heart palpitations and hypertension who presents for cardiovascular follow-up.  She experiences heart palpitations every morning around 8:30 AM, which wake her up. These episodes improve after taking metoprolol  at 10 AM and 10 PM, maintaining a 12-hour schedule. Palpitations persist and may be triggered by stress or lack of sleep per her report. She avoids excessive caffeine and ensures  adequate hydration.  She experiences sporadic chest pain that resolves spontaneously. Her cholesterol levels were recently checked and she recalls they were good. She administers Praluent injections herself without difficulty.   She reports leg pain when walking more than usual or at a faster pace. This is unchanged from previous. Her blood pressure has been fluctuating, with recent readings as high as 150 mmHg. She monitors her blood pressure at home, noting that it often decreases on a second reading after resting.  She notes a rash under her left breast for the last 3 months which she has not yet discussed with PCP with description consistent with nerve pain. The rash was without pustules. Now dry and itching.  She reports upper abdominal pain and swelling. She is concerned about aneurysm though no prior history. Her PCP previously did blood work which was unremarkable per her report. Pain occurs at random and does not routinely occur after eating.     ROS: Please see the history of present illness.    All other systems reviewed and are negative.   Studies Reviewed      Cardiac Studies & Procedures   ______________________________________________________________________________________________     ECHOCARDIOGRAM  ECHOCARDIOGRAM COMPLETE 06/25/2022  Narrative ECHOCARDIOGRAM REPORT    Patient Name:   Hannah [name] Scardino Date of Exam: 06/25/2022 Medical Rec #:  [id number]           Height:       62.0 in Accession #:    [id number]          Weight:       123.8 lb Date of Birth:  13-Sep-1945  BSA:          1.559 m Patient Age:    77 years            BP:           142/78 mmHg Patient Gender: F                   HR:           59 bpm. Exam Location:  [address]  Procedure: 2D Echo, Cardiac Doppler, Color Doppler and Strain Analysis  Indications:    R06.00 SOB  History:        Patient has prior history of Echocardiogram examinations, most recent 09/27/2018.  Signs/Symptoms:Shortness of Breath; Risk Factors:Hypertension, Dyslipidemia, Palpitations and Former Smoker.  Sonographer:    Elsie Bohr RDCS Referring Phys: [phone] DORN PARAS BERRY   Sonographer Comments: Global longitudinal strain was attempted. IMPRESSIONS   1. Left ventricular ejection fraction, by estimation, is 60 to 65%. The left ventricle has normal function. The left ventricle has no regional wall motion abnormalities. There is mild left ventricular hypertrophy. Left ventricular diastolic parameters are consistent with Grade I diastolic dysfunction (impaired relaxation). 2. Right ventricular systolic function is normal. The right ventricular size is normal. 3. Mild mitral valve regurgitation. 4. The aortic valve is tricuspid. Aortic valve regurgitation is not visualized. 5. The inferior vena cava is normal in size with greater than 50% respiratory variability, suggesting right atrial pressure of 3 mmHg.  FINDINGS Left Ventricle: Left ventricular ejection fraction, by estimation, is 60 to 65%. The left ventricle has normal function. The left ventricle has no regional wall motion abnormalities. The left ventricular internal cavity size was normal in size. There is mild left ventricular hypertrophy. Left ventricular diastolic parameters are consistent with Grade I diastolic dysfunction (impaired relaxation).  Right Ventricle: The right ventricular size is normal. Right ventricular systolic function is normal.  Left Atrium: Left atrial size was normal in size.  Right Atrium: Right atrial size was normal in size.  Pericardium: There is no evidence of pericardial effusion.  Mitral Valve: Mild mitral valve regurgitation.  Tricuspid Valve: Tricuspid valve regurgitation is mild.  Aortic Valve: The aortic valve is tricuspid. Aortic valve regurgitation is not visualized.  Pulmonic Valve: Pulmonic valve regurgitation is not visualized.  Aorta: The aortic root and ascending  aorta are structurally normal, with no evidence of dilitation.  Venous: The inferior vena cava is normal in size with greater than 50% respiratory variability, suggesting right atrial pressure of 3 mmHg.  IAS/Shunts: No atrial level shunt detected by color flow Doppler.   LEFT VENTRICLE PLAX 2D LVIDd:         4.00 cm   Diastology LVIDs:         2.50 cm   LV e' medial:    6.53 cm/s LV PW:         1.00 cm   LV E/e' medial:  15.5 LV IVS:        1.20 cm   LV e' lateral:   8.49 cm/s LVOT diam:     1.80 cm   LV E/e' lateral: 11.9 LV SV:         51 LV SV Index:   33        2D Longitudinal Strain LVOT Area:     2.54 cm  2D Strain GLS (A2C):   17.8 % 2D Strain GLS (A3C):   11.2 % 2D Strain GLS (A4C):   17.8 % 2D Strain  GLS Avg:     15.6 %  RIGHT VENTRICLE             IVC RV S prime:     10.60 cm/s  IVC diam: 1.40 cm TAPSE (M-mode): 2.1 cm RVSP:           28.8 mmHg  LEFT ATRIUM           Index        RIGHT ATRIUM           Index LA diam:      3.10 cm 1.99 cm/m   RA Pressure: 3.00 mmHg LA Vol (A4C): 36.8 ml 23.60 ml/m  RA Area:     14.10 cm RA Volume:   39.20 ml  25.14 ml/m AORTIC VALVE LVOT Vmax:   78.20 cm/s LVOT Vmean:  53.200 cm/s LVOT VTI:    0.202 m  AORTA Ao Root diam: 2.90 cm Ao Asc diam:  2.80 cm  MITRAL VALVE                TRICUSPID VALVE MV Area (PHT): 2.97 cm     TR Peak grad:   25.8 mmHg MV Decel Time: 255 msec     TR Vmax:        254.00 cm/s MV E velocity: 101.00 cm/s  Estimated RAP:  3.00 mmHg MV A velocity: 116.00 cm/s  RVSP:           28.8 mmHg MV E/A ratio:  0.87 SHUNTS Systemic VTI:  0.20 m Systemic Diam: 1.80 cm  Ronal Ross Electronically signed by Ronal Ross Signature Date/Time: 06/25/2022/5:35:01 PM    Final    MONITORS  LONG TERM MONITOR (3-14 DAYS) 04/07/2022  Narrative Patch Wear Time:  7 days and 0 hours  Patient had a min HR of 46 bpm, max HR of 182 bpm, and avg HR of 70 bpm. Predominant underlying rhythm was Sinus Rhythm. 5  Supraventricular Tachycardia runs occurred, the run with the fastest interval lasting 5 beats with a max rate of 182 bpm, the longest lasting 6 beats with an avg rate of 100 bpm. No VT, atrial fibrillation, high degree block, or pauses noted. Isolated atrial and ventricular ectopy was rare (<1%). There was 1 triggered event, which was sinus rhythm. No high risk arrhythmias detected.   CT SCANS  CT CORONARY MORPH W/CTA COR W/SCORE 06/11/2022  Addendum 06/13/2022  8:17 PM ADDENDUM REPORT: 06/13/2022 20:15  EXAM: OVER-READ INTERPRETATION  CT CHEST  The following report is an over-read performed by radiologist Dr. Franky Leff [facility] on 06/13/2022. This over-read does not include interpretation of cardiac or coronary anatomy or pathology. The coronary CTA interpretation by the cardiologist is attached.  COMPARISON:  04/08/2021  FINDINGS: Heart is normal size. Aorta normal caliber. No adenopathy. No confluent opacities or effusions. No acute findings in the upper abdomen. Chest wall soft tissues are unremarkable. No acute bony abnormality.  IMPRESSION: No acute or significant extracardiac abnormality.   Electronically Signed By: Franky Crease M.D. On: 06/13/2022 20:15  Narrative CLINICAL DATA:  58F with chest pain  EXAM: Cardiac/Coronary CTA  TECHNIQUE: The patient was scanned on a [employer].  FINDINGS: A 100 kV prospective scan was triggered in the descending thoracic aorta at 111 HU's. Axial non-contrast 3 mm slices were carried out through the heart. The data set was analyzed on a dedicated work station and scored using the Agatson method. Gantry rotation speed was 250 msecs and collimation was .6 mm.  No beta blockade and 0.8 mg of sl NTG was given. The 3D data set was reconstructed in 5% intervals of the 35-75% of the R-R cycle. Phases were analyzed on a dedicated work station using MPR, MIP and VRT modes. The patient received 100 cc of  contrast.  Coronary Arteries:  Normal coronary origin.  Right dominance.  RCA is a large dominant artery that gives rise to PDA and PLA. There is no plaque.  Left main is a large artery that gives rise to LAD and LCX arteries.  LAD is a large vessel that has no plaque.  LCX is a non-dominant artery that gives rise to one large OM1 branch. There is no plaque.  Other findings:  Left Ventricle: Normal size  Left Atrium: Normal size  Pulmonary Veins: Normal configuration  Right Ventricle: Normal size  Right Atrium: Normal size  Cardiac valves: No calcifications  Thoracic aorta: Normal size  Pulmonary Arteries: Normal size  Systemic Veins: Normal drainage  Pericardium: Normal thickness  IMPRESSION: 1. Coronary calcium  score of 0.  2. Normal coronary origin with right dominance.  3. No evidence of CAD.  CAD-RADS 0. No evidence of CAD (0%). Consider non-atherosclerotic causes of chest pain.  Electronically Signed: By: Lonni Nanas M.D. On: 06/11/2022 19:17     ______________________________________________________________________________________________      Risk Assessment/Calculations           Physical Exam VS:  BP (!) 114/58 (BP Location: Left Arm, Patient Position: Sitting, Cuff Size: Normal)   Pulse (!) 52   Ht 5' 2 (1.575 m)   Wt 123 lb (55.8 kg)   BMI 22.50 kg/m        Wt Readings from Last 3 Encounters:  10/28/23 123 lb (55.8 kg)  09/13/23 123 lb 12.8 oz (56.2 kg)  06/13/23 128 lb 3.2 oz (58.2 kg)    GEN: Well nourished, well developed in no acute distress NECK: No JVD; No carotid bruits CARDIAC: RRR, no murmurs, rubs, gallops RESPIRATORY:  Clear to auscultation without rales, wheezing or rhonchi  ABDOMEN: Soft, non-tender, non-distended EXTREMITIES:  No edema; No deformity   ASSESSMENT AND PLAN  Palpitations - continue metoprolol  tartrate 37.5mg  BID.   Rash - under L breast present x 3 months per her report. Linear nature  concerning for shingles. She reports there were never pustules nor exudate. Presently itching and burning, dry. Picture placed under media with her consent. Referred to dermatology. Encouraged her to additionally discuss with her PCP.    Chest pain -at rest and with activity. Does not improve with rest.  Prior LHC with no ischemia.  EKG today no acute ST/T wave changes.  Her symptoms are overall atypical as they occur at rest and independently resolve.  No indication for ischemic evaluation at this time.    Hx of PE - Continue Eliquis  per PCP   PAD - No acute claudication symptoms. No abdominal pain after meals. She does note some upper abdominal pain at random which she associates with upper abdominal swelling. No known history of hiatal hernia. 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 She is concerned about aneurysm, discussed this would be atypical presentation as symptoms intermittent. Will discuss with Dr. Court whether imaging such as CTA or abdominal duplex warranted.   Orthostatic hypotension / HTN -continue hydrochlorothiazide  12.5mg  daily, amlodipine  10mg  daily. No recurrent orthostatic hypotension.          Dispo: follow up in 5 months with  Dr. Court  Signed, Reche GORMAN Finder, NP

## 2023-11-01 ENCOUNTER — Ambulatory Visit (HOSPITAL_COMMUNITY): Admitting: Psychiatry

## 2023-11-04 ENCOUNTER — Encounter (HOSPITAL_BASED_OUTPATIENT_CLINIC_OR_DEPARTMENT_OTHER): Payer: Self-pay

## 2023-11-13 ENCOUNTER — Other Ambulatory Visit: Payer: Self-pay | Admitting: Primary Care

## 2023-11-14 NOTE — Telephone Encounter (Signed)
 We received a request for refill of patient's Trazadone.  Please advise.  Thank you.

## 2023-11-16 ENCOUNTER — Ambulatory Visit (HOSPITAL_COMMUNITY): Admitting: Psychiatry

## 2023-11-18 ENCOUNTER — Telehealth (HOSPITAL_BASED_OUTPATIENT_CLINIC_OR_DEPARTMENT_OTHER): Payer: Self-pay | Admitting: *Deleted

## 2023-11-18 NOTE — Telephone Encounter (Signed)
  Hi Miss Gillooly,    I discussed your abdominal pain with Dr. Court. He agreed it was atypical presentation for aneurysm and is not sure a CT of your abdomen would be revealing. We would recommend follow up with primary care provider regarding abdominal pain.    Best,  Reche GORMAN Finder, NP    Patient never reviewed message   Left message to call back

## 2023-11-30 ENCOUNTER — Ambulatory Visit (HOSPITAL_BASED_OUTPATIENT_CLINIC_OR_DEPARTMENT_OTHER): Admitting: Psychiatry

## 2023-11-30 VITALS — BP 159/76 | HR 48 | Resp 16 | Ht 62.0 in | Wt 121.0 lb

## 2023-11-30 DIAGNOSIS — F321 Major depressive disorder, single episode, moderate: Secondary | ICD-10-CM | POA: Diagnosis not present

## 2023-11-30 MED ORDER — SERTRALINE HCL 25 MG PO TABS: 25.0000 mg | ORAL_TABLET | Freq: Every day | ORAL | 4 refills | Status: DC

## 2023-11-30 NOTE — Progress Notes (Signed)
 Psychiatric Initial Adult Assessment   Patient Identification: Hannah [name] MRN:  [id number] Date of Evaluation:  11/30/2023 Referral Source:  Chief Complaint:   Visit Diagnosis:   History of Present Illness:    Today the patient is seen in the office.  She actually does not seem to be all that much better.  It turned out that her primary care doctor informed her that there might be a drug interaction with beta-blocker and with Wellbutrin .  Therefore she did not start it.  Patient still describes persistent daily depression she describes problems with her sleep.  She talked a lot about having sleepwalking and that was the problem that does not seem to be present at this time.  Her appetite is reduced her energy level is not great.  Patient described herself as not being functional.  She describes herself as a person he used to go out and do a lot shop and play the piano none of which she is doing now.  Therefore the patient seems to clearly have reduction in her ability to enjoy things.  She also describes a fear of driving.  She does not drive now and she is even frightened when her husband drives.  She does describe a lot of anxiety.  She says her anxiety and depression are not quickly to be jumping.  Today we talked about the possibility of using Remeron but she is concerned about weight gain.  Today we talked about using Zoloft  and with some persuasion she agreed to start a low dose of it.  The patient is certainly not suicidal.  She drinks no alcohol and uses no drugs. Associated Signs/Symptoms: Depression Symptoms: Daily depression (Hypo) Manic Symptoms:   Anxiety Symptoms:  Excessive Worry, Psychotic Symptoms:   PTSD Symptoms: NA  Past Psychiatric History: Minimal  Previous Psychotropic Medications: Yes   Substance Abuse History in the last 12 months:  No.  Consequences of Substance Abuse: NA  Past Medical History:  Past Medical History:  Diagnosis Date   Acute pulmonary  embolism (HCC) 07/07/2019   Chest pain with normal coronary angiography 08/09/2014   Cath 2013   Dyslipidemia 06/25/2013   Zetia added Feb 2020- PCP follows   Essential hypertension 06/25/2013   Echo June 2020- EF 50-55%, diastolic dysfunction   Family history of coronary artery disease 01/17/2019   Father and two brothers with early CAD   Family history of heart disease    Hyperlipidemia    Hypertension    Leriche syndrome (HCC)    Normocytic anemia 11/28/2016   Palpitations 06/08/2016   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 Some claudication but she declines further work up. ABis moderately decreased March 2020   Pulmonary emboli (HCC) 07/07/2019   Acute pulmonary lesion    Past Surgical History:  Procedure Laterality Date   ABI - BILATERAL  2008   moderate arterial insufficiency at rest; pulsatile flow of bilateral ankle PVRs   APPENDECTOMY  11/21/2002   acute appendicitis   LEFT HEART CATHETERIZATION WITH CORONARY ANGIOGRAM N/A 11/10/2011   Procedure: LEFT HEART CATHETERIZATION WITH CORONARY ANGIOGRAM;  Surgeon: Dorn [name], MD;  Location: [facility] CATH LAB;  Service: Cardiovascular;  Laterality: N/A; - normal left main/LAD/L Cfx/RCA   NM MYOCAR PERF WALL MOTION  10/2011   lexiscan - EF71%, normal perfusion; low risk scan   ROTATOR CUFF REPAIR  1995   TRANSTHORACIC ECHOCARDIOGRAM  10/2011   EF=>55%, calcified moderator band in the RV; borderline LA enlargement; mild MR; mild TR; trace AV regurg & pulm valve regurg    Family Psychiatric History:   Family History:  Family History  Problem Relation Age of Onset   Dementia Mother    Heart disease Father    Hypertension Father    Cancer Father    Heart attack Father        in 72s   Stroke Sister    Breast cancer Sister 47   Hypertension Sister    Hypertension Brother        2 with CABG   Heart Problems Child        born with  enlarged heart    Social History:   Social History   Socioeconomic History   Marital status: Married    Spouse name: Not on file   Number of children: 1   Years of education: 12   Highest education level: Not on file  Occupational History   Not on file  Tobacco Use   Smoking status: Former    Current packs/day: 0.00    Average packs/day: 0.5 packs/day for 23.0 years (11.5 ttl pk-yrs)    Types: Cigarettes    Start date: 1966    Quit date: 1989    Years since quitting: 36.6   Smokeless tobacco: Never   Tobacco comments:    Started smoking at 71 and stopped 26 started again at 36 stopped again at 30   Substance and Sexual Activity   Alcohol use: Yes    Comment: occasional wine   Drug use: No   Sexual activity: Not Currently  Other Topics Concern   Not on file  Social History Narrative   Left Handed    Lives in a 2 story with a basement. Lives with husband    Social Drivers of Corporate investment banker Strain: Not on file  Food Insecurity: Not on file  Transportation Needs: Not on file  Physical Activity: Not on file  Stress: Not on file  Social Connections: Not on file    Additional Social History:   Allergies:   Allergies  Allergen Reactions   Amoxicillin Swelling   Ramipril Swelling    Angioedema     Metabolic Disorder Labs: No results found for: HGBA1C, MPG No results found for: PROLACTIN No results found for: CHOL, TRIG, HDL, CHOLHDL, VLDL, LDLCALC Lab Results  Component Value Date   TSH 1.810 03/25/2022    Therapeutic Level Labs: No results found for: LITHIUM No results found for: CBMZ No results found for: VALPROATE  Current Medications: Current Outpatient Medications  Medication Sig Dispense Refill   sertraline  (ZOLOFT ) 25 MG tablet Take 1 tablet (25 mg total) by mouth daily. 30 tablet 4   albuterol  (VENTOLIN  HFA) 108 (90 Base) MCG/ACT inhaler Inhale 2 puffs into the lungs every 6 (six) hours as needed for  wheezing or shortness of breath. 18 g 1   amLODipine  (NORVASC ) 10 MG tablet Take 10 mg by mouth daily.     apixaban  (ELIQUIS ) 5 MG TABS tablet TAKE 1 TABLET BY MOUTH TWICE DAILY 60 tablet 5   buPROPion  (WELLBUTRIN  XL) 150 MG 24 hr tablet Take 1 tablet (150 mg total) by mouth daily. 30 tablet 3   Coenzyme Q-10 200 MG CAPS Take 200 mg by mouth daily.     DEXILANT 60 MG capsule Take 60 mg by mouth daily.  0   hydrochlorothiazide  (HYDRODIURIL ) 12.5 MG tablet  Take 12.5 mg by mouth daily.     metoprolol  tartrate (LOPRESSOR ) 25 MG tablet Take 1.5 tablets (37.5 mg total) by mouth 2 (two) times daily. 45 tablet 0   PRALUENT 150 MG/ML SOAJ Inject into the skin.     rosuvastatin  (CRESTOR ) 40 MG tablet Take 40 mg by mouth daily.     traZODone  (DESYREL ) 50 MG tablet TAKE 1 TABLET(50 MG) BY MOUTH AT BEDTIME AS NEEDED FOR SLEEP 30 tablet 1   Vitamin D, Ergocalciferol, (DRISDOL) 50000 units CAPS capsule Take 50,000 Units by mouth every Wednesday.   2   No current facility-administered medications for this visit.    Musculoskeletal: Strength & Muscle Tone: within normal limits Gait & Station: normal Patient leans: N/A  Psychiatric Specialty Exam: Review of Systems  Blood pressure (!) 159/76, pulse (!) 48, resp. rate 16, height 5' 2 (1.575 m), weight 121 lb (54.9 kg).Body mass index is 22.13 kg/m.  General Appearance: Casual  Eye Contact:  Good  Speech:  Clear and Coherent  Volume:  Normal  Mood:  Anxious  Affect:  Congruent  Thought Process:  Goal Directed  Orientation:  Full (Time, Place, and Person)  Thought Content:  WDL  Suicidal Thoughts:  No  Homicidal Thoughts:  No  Memory:  NA  Judgement:  Good  Insight:  Good  Psychomotor Activity:    Concentration:  Concentration: Fair  Recall:  Good  Fund of Knowledge:Good  Language: Good  Akathisia:  No  Handed:  Right  AIMS (if indicated):  not done  Assets:  Desire for Improvement  ADL's:  Intact  Cognition: WNL  Sleep:  Fair    Screenings: Flowsheet Row ED from 01/11/2022 in [facility] Emergency Department at [facility] ED from 04/08/2021 in [facility] Emergency Department at [facility] ED from 12/16/2020 in [facility] Emergency Department at [facility]  C-SSRS RISK CATEGORY No Risk No Risk No Risk    Assessment and Plan:   At this time 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  Today we will go ahead and begin her on Zoloft  25 mg she will return to see us  in 5 weeks.  Patient seems to be very sensitive to potential side effects.  We will be cautious.  She does not really describe any significant psychosocial stressors at this time.  We will review this again when I see her.  Patient will return again in 5 weeks.  Collaboration of Care:   Patient/Guardian was advised Release of Information must be obtained prior to any record release in order to collaborate their care with an outside provider. Patient/Guardian was advised if they have not already done so to contact the registration department to sign all necessary forms in order for us  to release information regarding their care.   Consent: Patient/Guardian gives verbal consent for treatment and assignment of benefits for services provided during this visit. Patient/Guardian expressed understanding and agreed to proceed.   Elna LILLETTE Lo, MD 8/20/20251:59 PM

## 2023-11-30 NOTE — Telephone Encounter (Signed)
 Patient read mychart message   Last read by Neville Macdonald Hope at 10:43PM on 11/28/2023.

## 2023-12-08 ENCOUNTER — Ambulatory Visit: Admitting: Primary Care

## 2024-01-11 ENCOUNTER — Telehealth: Payer: Self-pay | Admitting: Family

## 2024-01-11 NOTE — Telephone Encounter (Signed)
 Pt c/o medication issue:  1. Name of Medication: sertraline  (ZOLOFT ) 25 MG tablet   2. How are you currently taking this medication (dosage and times per day)? As written   3. Are you having a reaction (difficulty breathing--STAT)? No   4. What is your medication issue? Pt would like to know if the medication okay to take with her others.

## 2024-01-11 NOTE — Telephone Encounter (Signed)
 Pavero, Lonni, [facility] to [facility] Triage (Selected Message)   01/11/24  4:38 PM Yes, ok to take   Called patient w/advice from pharmacy team.

## 2024-01-11 NOTE — Telephone Encounter (Signed)
Routed to pharmacy team to review 

## 2024-01-18 ENCOUNTER — Ambulatory Visit (HOSPITAL_COMMUNITY): Admitting: Psychiatry

## 2024-01-18 ENCOUNTER — Other Ambulatory Visit: Payer: Self-pay

## 2024-01-18 ENCOUNTER — Encounter (HOSPITAL_COMMUNITY): Payer: Self-pay | Admitting: Psychiatry

## 2024-01-18 VITALS — BP 149/79 | HR 59 | Ht 62.0 in | Wt 117.0 lb

## 2024-01-18 DIAGNOSIS — F329 Major depressive disorder, single episode, unspecified: Secondary | ICD-10-CM

## 2024-01-18 MED ORDER — ZOLPIDEM TARTRATE 5 MG PO TABS: 5.0000 mg | ORAL_TABLET | Freq: Every evening | ORAL | 4 refills | Status: DC | PRN

## 2024-01-18 MED ORDER — SERTRALINE HCL 50 MG PO TABS: 25.0000 mg | ORAL_TABLET | Freq: Every day | ORAL | 5 refills | Status: DC

## 2024-01-18 MED ORDER — DIAZEPAM 5 MG PO TABS: ORAL_TABLET | ORAL | 4 refills | Status: DC

## 2024-01-18 NOTE — Progress Notes (Signed)
 Psychiatric Initial Adult Assessment   Patient Identification: Hannah [name] MRN:  [id number] Date of Evaluation:  01/18/2024 Referral Source:  Chief Complaint:   Visit Diagnosis:   History of Present Illness:     Today the patient is not doing well.  Her depression seems to be worse.  Since we stopped her low-dose Valium  she says she feels even worse.  She would take it in the afternoon and in the afternoon now without the Valium  she feels more uncomfortable as palpitations and complains of headaches.  Her anxiety is worse.  Note is the patient never took Wellbutrin  as her doctor told her it would interact with her beta-blocker.  The patient did take Remeron in the past but it did not work.  Patient is taking 25 mg of Zoloft  with no benefit at this time.  Her appetite is down and her sleep is very disturbed. Associated Signs/Symptoms: Depression Symptoms: Daily depression (Hypo) Manic Symptoms:   Anxiety Symptoms:  Excessive Worry, Psychotic Symptoms:   PTSD Symptoms: NA  Past Psychiatric History: Minimal  Previous Psychotropic Medications: Yes   Substance Abuse History in the last 12 months:  No.  Consequences of Substance Abuse: NA  Past Medical History:  Past Medical History:  Diagnosis Date   Acute pulmonary embolism (HCC) 07/07/2019   Chest pain with normal coronary angiography 08/09/2014   Cath 2013   Dyslipidemia 06/25/2013   Zetia added Feb 2020- PCP follows   Essential hypertension 06/25/2013   Echo June 2020- EF 50-55%, diastolic dysfunction   Family history of coronary artery disease 01/17/2019   Father and two brothers with early CAD   Family history of heart disease    Hyperlipidemia    Hypertension    Leriche syndrome (HCC)    Normocytic anemia 11/28/2016   Palpitations 06/08/2016   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 Some claudication but she declines  further work up. ABis moderately decreased March 2020   Pulmonary emboli (HCC) 07/07/2019   Acute pulmonary lesion    Past Surgical History:  Procedure Laterality Date   ABI - BILATERAL  2008   moderate arterial insufficiency at rest; pulsatile flow of bilateral ankle PVRs   APPENDECTOMY  11/21/2002   acute appendicitis   LEFT HEART CATHETERIZATION WITH CORONARY ANGIOGRAM N/A 11/10/2011   Procedure: LEFT HEART CATHETERIZATION WITH CORONARY ANGIOGRAM;  Surgeon: Dorn [name], MD;  Location: [facility] CATH LAB;  Service: Cardiovascular;  Laterality: N/A; - normal left main/LAD/L Cfx/RCA   NM MYOCAR PERF WALL MOTION  10/2011   lexiscan - EF71%, normal perfusion; low risk scan   ROTATOR CUFF REPAIR  1995   TRANSTHORACIC ECHOCARDIOGRAM  10/2011   EF=>55%, calcified moderator band in the RV; borderline LA enlargement; mild MR; mild TR; trace AV regurg & pulm valve regurg    Family Psychiatric History:   Family History:  Family History  Problem Relation Age of Onset   Dementia Mother    Heart disease Father    Hypertension Father    Cancer Father    Heart attack Father        in 73s   Stroke Sister    Breast cancer Sister 57   Hypertension Sister    Hypertension Brother        2 with CABG   Heart Problems Child        born with enlarged heart    Social  History:   Social History   Socioeconomic History   Marital status: Married    Spouse name: Not on file   Number of children: 1   Years of education: 12   Highest education level: Not on file  Occupational History   Not on file  Tobacco Use   Smoking status: Former    Current packs/day: 0.00    Average packs/day: 0.5 packs/day for 23.0 years (11.5 ttl pk-yrs)    Types: Cigarettes    Start date: 86    Quit date: 1989    Years since quitting: 36.7   Smokeless tobacco: Never   Tobacco comments:    Started smoking at 56 and stopped 26 started again at 36 stopped again at 53   Substance and Sexual Activity   Alcohol use: Yes     Comment: occasional wine   Drug use: No   Sexual activity: Not Currently  Other Topics Concern   Not on file  Social History Narrative   Left Handed    Lives in a 2 story with a basement. Lives with husband    Social Drivers of Corporate investment banker Strain: Not on file  Food Insecurity: Not on file  Transportation Needs: Not on file  Physical Activity: Not on file  Stress: Not on file  Social Connections: Not on file    Additional Social History:   Allergies:   Allergies  Allergen Reactions   Amoxicillin Swelling   Ramipril Swelling    Angioedema     Metabolic Disorder Labs: No results found for: HGBA1C, MPG No results found for: PROLACTIN No results found for: CHOL, TRIG, HDL, CHOLHDL, VLDL, LDLCALC Lab Results  Component Value Date   TSH 1.810 03/25/2022    Therapeutic Level Labs: No results found for: LITHIUM No results found for: CBMZ No results found for: VALPROATE  Current Medications: Current Outpatient Medications  Medication Sig Dispense Refill   albuterol  (VENTOLIN  HFA) 108 (90 Base) MCG/ACT inhaler Inhale 2 puffs into the lungs every 6 (six) hours as needed for wheezing or shortness of breath. 18 g 1   amLODipine  (NORVASC ) 10 MG tablet Take 10 mg by mouth daily.     apixaban  (ELIQUIS ) 5 MG TABS tablet TAKE 1 TABLET BY MOUTH TWICE DAILY 60 tablet 5   Coenzyme Q-10 200 MG CAPS Take 200 mg by mouth daily.     DEXILANT 60 MG capsule Take 60 mg by mouth daily.  0   diazepam  (VALIUM ) 5 MG tablet Half Q day 30 tablet 4   hydrochlorothiazide  (HYDRODIURIL ) 12.5 MG tablet Take 12.5 mg by mouth daily.     metoprolol  tartrate (LOPRESSOR ) 25 MG tablet Take 1.5 tablets (37.5 mg total) by mouth 2 (two) times daily. 45 tablet 0   PRALUENT 150 MG/ML SOAJ Inject into the skin.     rosuvastatin  (CRESTOR ) 40 MG tablet Take 40 mg by mouth daily.     Vitamin D, Ergocalciferol, (DRISDOL) 50000 units CAPS capsule Take 50,000 Units by mouth  every Wednesday.   2   zolpidem (AMBIEN) 5 MG tablet Take 1 tablet (5 mg total) by mouth at bedtime as needed for sleep. 30 tablet 4   buPROPion  (WELLBUTRIN  XL) 150 MG 24 hr tablet Take 1 tablet (150 mg total) by mouth daily. (Patient not taking: Reported on 01/18/2024) 30 tablet 3   sertraline  (ZOLOFT ) 50 MG tablet Take 0.5 tablets (25 mg total) by mouth daily. 30 tablet 5   traZODone  (DESYREL ) 50 MG tablet  TAKE 1 TABLET(50 MG) BY MOUTH AT BEDTIME AS NEEDED FOR SLEEP (Patient not taking: Reported on 01/18/2024) 30 tablet 1   No current facility-administered medications for this visit.    Musculoskeletal: Strength & Muscle Tone: within normal limits Gait & Station: normal Patient leans: N/A  Psychiatric Specialty Exam: Review of Systems  Blood pressure (!) 149/79, pulse (!) 59, height 5' 2 (1.575 m), weight 117 lb (53.1 kg).Body mass index is 21.4 kg/m.  General Appearance: Casual  Eye Contact:  Good  Speech:  Clear and Coherent  Volume:  Normal  Mood:  Anxious  Affect:  Congruent  Thought Process:  Goal Directed  Orientation:  Full (Time, Place, and Person)  Thought Content:  WDL  Suicidal Thoughts:  No  Homicidal Thoughts:  No  Memory:  NA  Judgement:  Good  Insight:  Good  Psychomotor Activity:    Concentration:  Concentration: Fair  Recall:  Good  Fund of Knowledge:Good  Language: Good  Akathisia:  No  Handed:  Right  AIMS (if indicated):  not done  Assets:  Desire for Improvement  ADL's:  Intact  Cognition: WNL  Sleep:  Fair   Screenings: Flowsheet Row ED from 01/11/2022 in [facility] at [facility] ED from 04/08/2021 in [facility] Emergency Department at [facility] ED from 12/16/2020 in [facility] at [facility]  C-SSRS RISK CATEGORY No Risk No Risk No Risk    Assessment and Plan:    At this time the patient will increase her Zoloft  to 50 mg.  Will go ahead and start her back on Valium  5 mg  pill which she will take after that every afternoon.  For sleep we will begin her on Ambien 5 mg at night.  Patient return to see me in 5 weeks.  Collaboration of Care:   Patient/Guardian was advised Release of Information must be obtained prior to any record release in order to collaborate their care with an outside provider. Patient/Guardian was advised if they have not already done so to contact the registration department to sign all necessary forms in order for us  to release information regarding their care.   Consent: Patient/Guardian gives verbal consent for treatment and assignment of benefits for services provided during this visit. Patient/Guardian expressed understanding and agreed to proceed.   Elna LILLETTE Lo, MD 10/8/20251:42 PM

## 2024-01-20 ENCOUNTER — Telehealth: Payer: Self-pay | Admitting: Cardiovascular Disease

## 2024-01-20 DIAGNOSIS — R002 Palpitations: Secondary | ICD-10-CM

## 2024-01-20 MED ORDER — METOPROLOL TARTRATE 25 MG PO TABS: 37.5000 mg | ORAL_TABLET | Freq: Two times a day (BID) | ORAL | 3 refills | Status: AC

## 2024-01-20 NOTE — Telephone Encounter (Signed)
 Pt's medication was sent to pt's pharmacy as requested. Confirmation received.

## 2024-01-20 NOTE — Telephone Encounter (Signed)
*  STAT* If patient is at the pharmacy, call can be transferred to refill team.   1. Which medications need to be refilled? (please list name of each medication and dose if known) metoprolol  tartrate (LOPRESSOR ) 25 MG tablet   2. Which pharmacy/location (including street and city if local pharmacy) is medication to be sent to? [id number] - [place], [place] - [address] AT [facility] [address] Phone: [phone]  Fax: [phone]     3. Do they need a 30 day or 90 day supply? 90 Pt is out of medication

## 2024-01-24 ENCOUNTER — Encounter: Payer: Self-pay | Admitting: Primary Care

## 2024-01-24 ENCOUNTER — Ambulatory Visit: Admitting: Primary Care

## 2024-01-24 VITALS — BP 108/68 | HR 68 | Temp 98.1°F | Ht 62.0 in | Wt 117.2 lb

## 2024-01-24 DIAGNOSIS — I10 Essential (primary) hypertension: Secondary | ICD-10-CM

## 2024-01-24 DIAGNOSIS — G4700 Insomnia, unspecified: Secondary | ICD-10-CM

## 2024-01-24 NOTE — Patient Instructions (Addendum)
 VISIT SUMMARY: Today, we discussed your ongoing issues with insomnia, anxiety, depression, and heart palpitations. We reviewed your current medications and recent stress test results. We also talked about your concerns with using Ambien and your recent weight loss.  YOUR PLAN: -INSOMNIA: Insomnia is a condition where you have trouble falling asleep or staying asleep. We recommend taking Ambien 5 mg at 11:30 PM and being in bed by midnight or 12:30 AM. We also suggest establishing a consistent bedtime and considering cognitive behavioral therapy for insomnia. We will provide contact information for a therapist who specializes in this area.  -ANXIETY AND DEPRESSION: Anxiety and depression are mental health conditions that can affect your mood and overall well-being. You are currently taking Zoloft , which was recently increased to 50 mg. It may take 4-8 weeks to notice the benefits. Please continue your psychiatric follow-up for medication management.  INSTRUCTIONS: Please follow up with your psychiatrist for potential dose adjustment if your anxiety or depression persists. Additionally, consider cognitive behavioral therapy for your insomnia. Continue taking your medications as prescribed and monitor your symptoms. If you have any concerns or new symptoms, please contact our office.  Please reach out to center for cognitive behavioral therapy for sleep training   Follow-up: As needed if; if you'd like psychiatry can manage your insomnia    Insomnia Insomnia is a sleep disorder that makes it difficult to fall asleep or stay asleep. Insomnia can cause fatigue, low energy, difficulty concentrating, mood swings, and poor performance at work or school. There are three different ways to classify insomnia: Difficulty falling asleep. Difficulty staying asleep. Waking up too early in the morning. Any type of insomnia can be long-term (chronic) or short-term (acute). Both are common. Short-term insomnia  usually lasts for 3 months or less. Chronic insomnia occurs at least three times a week for longer than 3 months. What are the causes? Insomnia may be caused by another condition, situation, or substance, such as: Having certain mental health conditions, such as anxiety and depression. Using caffeine, alcohol, tobacco, or drugs. Having gastrointestinal conditions, such as gastroesophageal reflux disease (GERD). Having certain medical conditions. These include: Asthma. Alzheimer's disease. Stroke. Chronic pain. An overactive thyroid  gland (hyperthyroidism). Other sleep disorders, such as restless legs syndrome and sleep apnea. Menopause. Sometimes, the cause of insomnia may not be known. What increases the risk? Risk factors for insomnia include: Gender. Females are affected more often than males. Age. Insomnia is more common as people get older. Stress and certain medical and mental health conditions. Lack of exercise. Having an irregular work schedule. This may include working night shifts and traveling between different time zones. What are the signs or symptoms? If you have insomnia, the main symptom is having trouble falling asleep or having trouble staying asleep. This may lead to other symptoms, such as: Feeling tired or having low energy. Feeling nervous about going to sleep. Not feeling rested in the morning. Having trouble concentrating. Feeling irritable, anxious, or depressed. How is this diagnosed? This condition may be diagnosed based on: Your symptoms and medical history. Your health care provider may ask about: Your sleep habits. Any medical conditions you have. Your mental health. A physical exam. How is this treated? Treatment for insomnia depends on the cause. Treatment may focus on treating an underlying condition that is causing the insomnia. Treatment may also include: Medicines to help you sleep. Counseling or therapy. Lifestyle adjustments to help you  sleep better. Follow these instructions at home: Eating and drinking  Limit  or avoid alcohol, caffeinated beverages, and products that contain nicotine and tobacco, especially close to bedtime. These can disrupt your sleep. Do not eat a large meal or eat spicy foods right before bedtime. This can lead to digestive discomfort that can make it hard for you to sleep. Sleep habits  Keep a sleep diary to help you and your health care provider figure out what could be causing your insomnia. Write down: When you sleep. When you wake up during the night. How well you sleep and how rested you feel the next day. Any side effects of medicines you are taking. What you eat and drink. Make your bedroom a dark, comfortable place where it is easy to fall asleep. Put up shades or blackout curtains to block light from outside. Use a white noise machine to block noise. Keep the temperature cool. Limit screen use before bedtime. This includes: Not watching TV. Not using your smartphone, tablet, or computer. Stick to a routine that includes going to bed and waking up at the same times every day and night. This can help you fall asleep faster. Consider making a quiet activity, such as reading, part of your nighttime routine. Try to avoid taking naps during the day so that you sleep better at night. Get out of bed if you are still awake after 15 minutes of trying to sleep. Keep the lights down, but try reading or doing a quiet activity. When you feel sleepy, go back to bed. General instructions Take over-the-counter and prescription medicines only as told by your health care provider. Exercise regularly as told by your health care provider. However, avoid exercising in the hours right before bedtime. Use relaxation techniques to manage stress. Ask your health care provider to suggest some techniques that may work well for you. These may include: Breathing exercises. Routines to release muscle  tension. Visualizing peaceful scenes. Make sure that you drive carefully. Do not drive if you feel very sleepy. Keep all follow-up visits. This is important. Contact a health care provider if: You are tired throughout the day. You have trouble in your daily routine due to sleepiness. You continue to have sleep problems, or your sleep problems get worse. Get help right away if: You have thoughts about hurting yourself or someone else. Get help right away if you feel like you may hurt yourself or others, or have thoughts about taking your own life. Go to your nearest emergency room or: Call 911. Call the National Suicide Prevention Lifeline at 512-347-9814 or 988. This is open 24 hours a day. Text the Crisis Text Line at (762)247-5637. Summary Insomnia is a sleep disorder that makes it difficult to fall asleep or stay asleep. Insomnia can be long-term (chronic) or short-term (acute). Treatment for insomnia depends on the cause. Treatment may focus on treating an underlying condition that is causing the insomnia. Keep a sleep diary to help you and your health care provider figure out what could be causing your insomnia. This information is not intended to replace advice given to you by your health care provider. Make sure you discuss any questions you have with your health care provider. Document Revised: 03/09/2021 Document Reviewed: 03/09/2021 Elsevier Patient Education  2024 [employer].

## 2024-01-24 NOTE — Progress Notes (Signed)
 @Patient  ID: Hannah [name], female    DOB: 1945/04/23, 78 y.o.   MRN: [id number]  Chief Complaint  Patient presents with   Follow-up    Insomnia is not getting better    Referring provider: Valma Carwin, MD  HPI: 78 year old female, smoker quit 1989 (11.5-pack-year history).  Past medical history significant for hypertension, acute pulmonary embolism, peripheral artery disease, dyslipidemia.  Former patient of Dr. Shellia.  Previous [facility] pulmonary encounter:  03/28/2023 Discussed the use of AI scribe software for clinical note transcription with the patient, who gave verbal consent to proceed.  Patient presents today for 92-month follow-up.  She was last seen by Dr. Shellia in June for progressive dyspnea symptoms with restrictive defect on PFTs.  CT chest was unremarkable.  She was seen by neurology and exam was normal.  Acetylcholine receptor AB and B12 level was normal.  Dr. Magdaleno last note dyspnea felt to be related to deconditioning and poor after during testing maneuvers, exact cause of dyspnea unclear.  She might also need further assessment for anxiety. Arrange for cardio exercise test.  Follows with Dr. Wadie with cardiology.  On indefinite anticoagulation through her [facility] and cardiology for history of pulmonary embolism.  The patient underwent a cardiac stress test, which showed no evidence of cardiopulmonary abnormality. The test suggested mild functional impairment compared to age-matched sedentary norms, indicating potential deconditioning.  The patient has been experiencing depression and anxiety, which has led to decreased physical activity and potential deconditioning. She has recently received samples of an unspecified medication for depression from her primary care provider, but has not yet started taking it.  The patient also reports swelling in her fingertips, but neurological examination and testing, including acetylcholine receptor testing and B12 levels, were normal. The  patient has previously participated in a friend group exercise program via Zoom, but this was discontinued due to the illness of a group member.      Shortness of Breath No clear cardiopulmonary cause identified. Pulmonary function tests showed some restriction, possibly due to muscle weakness or poor effort during testing. No evidence of COPD or pulmonary fibrosis. Cardiac stress test showed no evidence of cardiopulmonary abnormality. Likely due to deconditioning. -Refer to pulmonary rehab program at [facility] for structured exercise program. If not covered by insurance, consider [facility] or home exercise program.  Depression Patient reports significant depression and anxiety, impacting motivation for physical activity. Primary care doctor has provided samples of medication for depression, but patient has not started yet. -Follow up with primary care doctor regarding effectiveness of depression medication. -Consider referral to a psychiatrist if symptoms do not improve with medication from primary care doctor.  06/13/2023 Discussed the use of AI scribe software for clinical note transcription with the patient, who gave verbal consent to proceed.  History of Present Illness   Hannah [name] is a 78 year old female who presents with shortness of breath for evaluation of unclear cardiopulmonary cause.  She experiences persistent shortness of breath with no identified cardiopulmonary cause. Previous evaluations, including pulmonary function tests and cardiac assessments, have not revealed any heart or lung abnormalities. A CT scan of the chest was negative for interstitial lung disease or fibrosis. There is some coronary artery calcification and a mildly enlarged heart, but stress tests showed no cardiopulmonary abnormalities. She attributes some of her symptoms to deconditioning and has been using a pedal exercise machine at home regularly.  She experiences  significant fatigue, which limits her daily  activities such as housework and Pharmacologist. She has consulted a neurologist for weakness, and lab work was negative for myasthenia gravis, with normal B12 levels. She reports finger swelling and redness, particularly in the mornings. She has a family history of lupus.  She reports sleep disturbances, including sleepwalking and acting out dreams. A previous sleep study was inconclusive due to lack of sleep. She has not tried a home sleep study. Her sleep schedule is irregular, often going to bed at 3 or 4 AM and waking up around 8:30 AM, with difficulty maintaining a consistent sleep routine. She wakes up every two hours during the night. She has tried melatonin but did not like the side effects from the medication.   She has not started medication for depression and anxiety provided by her primary care provider, preferring further evaluation. She also reports OCD and sleepwalking episodes. She has not used melatonin recently due to adverse effects in the past.      1. Chronic fatigue (Primary) - Ambulatory referral to Rheumatology - Ambulatory referral to Psychiatry - Home sleep test; Future   2. Depression, unspecified depression type - Ambulatory referral to Psychiatry   3. Other obsessive-compulsive disorders - Ambulatory referral to Psychiatry   Shortness of Breath No identified cardiopulmonary cause. Restrictive pattern on PFTs possibly due to muscle weakness or poor effort. No evidence of COPD or pulmonary fibrosis on PFTs or CT chest. Coronary artery disease and aortic calcification noted on CT chest, but heart not significantly enlarged. Stress test negative for cardiopulmonary abnormality. Likely related to deconditioning and muscle weakness. -Encourage regular exercise, aiming for at least 10-20 minutes three times a week. -Consider local gym facilities.   Chronic Fatigue Possible underlying rheumatological cause given family history of  lupus and peripheral symptoms in distal fingers. No evidence of myasthenia gravis or B12 deficiency. -Refer to Rheumatology for further evaluation CFS.   Depression and Anxiety Impacting motivation for physical activity. Patient has not started medication samples provided by primary care physician and prefers further evaluation. -Refer to Psychiatry for further evaluation and management.   Sleep Disturbances Reports of sleepwalking and acting out dreams. Irregular sleep schedule with late bedtime and frequent awakenings. Family history of sleep apnea. -Order home sleep study to rule out sleep apnea. -Encourage regular sleep schedule with a consistent bedtime.   Peripheral Finger Changes Reports of swelling and redness in distal fingers, particularly in the morning. No reported pain or cold sensitivity. -Refer to Rheumatology for further evaluation.     09/13/2023 Discussed the use of AI scribe software for clinical note transcription with the patient, who gave verbal consent to proceed.  History of Present Illness   Hannah [name] is a 78 year old female with chronic fatigue syndrome, depression, and insomnia who presents for follow-up regarding chronic fatigue, depression, and insomnia.  She has chronic fatigue and concerns about Raynaud's phenomenon, noting swelling and redness in her distal fingertips. She was previously referred to rheumatology for chronic fatigue syndrome, but the referral was denied by her insurance.  She underwent a sleep study, which showed an apnea score of 1.2 events per hour and no oxygen desaturation. Despite this, she continues to experience insomnia, having trouble both falling and staying asleep. She sometimes does not sleep until 6 AM, which affects her daily functioning.  She is currently taking Wellbutrin  150 mg for depression, which is new to her regimen, and diazepam  2.5 mg for anxiety, typically taken around 3:30 PM. 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 However, she discontinued clonazepam under the direction of her doctor due to adverse effects and has been using diazepam  instead. She last took clonazepam 5 years ago and is hesitant to retry.   She experiences nightmares and sleepwalking episodes approximately twice a week, with near accidents such as almost falling down stairs. She is concerned about the potential for these episodes to worsen.  In terms of her anxiety management, she has attempted to discontinue diazepam  in the past but experienced withdrawal symptoms, leading her to resume a half tablet daily. She is uncertain about the continuation of her diazepam  prescription.     No underlying cardiopulmonary disease has been identified. Pulmonary function tests showed some restriction, possibly due to muscle weakness or poor effort during testing. No evidence of COPD or pulmonary fibrosis. Cardiac stress test showed no evidence of cardiopulmonary abnormality. Dyspnea felt likely due to deconditioning. Encourage regular exercise, aiming for at least 20 minutes three times a week.   01/24/2024- Interim hx  Discussed the use of AI scribe software for clinical note transcription with the patient, who gave verbal consent to proceed.  History of Present Illness Hannah [name] is a 78 year old female who presents with insomnia.  She has not tried trazodone  due to concerns about interactions with Wellbutrin , which she is no longer taking. She recently started Ambien 5 mg but uses it inconsistently due to concerns about heart palpitations and anxiety. Her bedtime is around 2 AM, previously 4 AM, and she has difficulty initiating and maintaining sleep.  She is currently taking Zoloft , which was started at 25 mg and increased to 50 mg last week. She also uses Valium  to help acclimate to her medications. She experiences heart palpitations in  the morning, with her heart rate increasing from 60 bpm to over 100 bpm. She has a history of tachycardia, and her metoprolol  dosage was increased to manage this, but episodes persist.  Her medication regimen includes Eliquis , Norvasc , Dexilant, hydrochlorothiazide  as needed for swelling, metoprolol , and a Prolia injection. She also takes vitamin D. She has been losing weight recently and is unsure of the cause.  A stress test in June showed a resting heart rate of 60 bpm, standing heart rate of 65 bpm, and peak heart rate of 103 bpm. Her resting blood pressure was 122/60 mmHg, standing blood pressure was 120/64 mmHg, and peak blood pressure was 148/62 mmHg. Her spirometry and resting EKG were normal.  She is not currently working and has a habit of staying up late. She experiences heart palpitations, anxiety, and difficulty sleeping. No regular use of Ambien despite having trouble sleeping.   Cardiac testing 12/22/22 Cardiopulmonary stress test>> Conclusion: The interpretation of this test is limited due to submaximal effort during the exercise. Based on available data, exercise testing with gas exchange demonstrates mild functional impairment when compared to matched sedentary norms. There is no evidence for cardiopulmonary abnormality. Patient would likely benefit from routine, supervised exercise program.  Pulmonary function testing 07/23/22 PFTs>> FVC 1.45 (57%), FEV1 1.32 (69%), ratio 91 Spirometry suggestive of restrictive defect, pattern suggestive of respiratory muscle weakness or suboptimal effort.  Patient was unable to perform diffusion capacity test maneuver and opted against bronchodilator challenge.  Imaging 08/28/22 HRCT>> No evidence of interstitial lung disease. Negative for subpleural reticulation, traction bronchiectasis/bronchiolectasis, ground-glass, architectural distortion or honeycombing. Minimal scattered pulmonary parenchymal scarring. No air trapping. No pleural fluid.  Airway  is unremarkable.  Aortic atherosclerosis (ICD10-I70.0).    Allergies  Allergen Reactions   Amoxicillin Swelling   Ramipril Swelling    Angioedema     Immunization History  Administered Date(s) Administered   Fluad Trivalent(High Dose 65+) 02/07/2023   PFIZER(Purple Top)SARS-COV-2 Vaccination 06/03/2019, 06/27/2019    Past Medical History:  Diagnosis Date   Acute pulmonary embolism (HCC) 07/07/2019   Chest pain with normal coronary angiography 08/09/2014   Cath 2013   Dyslipidemia 06/25/2013   Zetia added Feb 2020- PCP follows   Essential hypertension 06/25/2013   Echo June 2020- EF 50-55%, diastolic dysfunction   Family history of coronary artery disease 01/17/2019   Father and two brothers with early CAD   Family history of heart disease    Hyperlipidemia    Hypertension    Leriche syndrome (HCC)    Normocytic anemia 11/28/2016   Palpitations 06/08/2016   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 Some claudication but she declines further work up. ABis moderately decreased March 2020   Pulmonary emboli (HCC) 07/07/2019   Acute pulmonary lesion    Tobacco History: Social History   Tobacco Use  Smoking Status Former   Current packs/day: 0.00   Average packs/day: 0.5 packs/day for 23.0 years (11.5 ttl pk-yrs)   Types: Cigarettes   Start date: 1966   Quit date: 1989   Years since quitting: 36.8  Smokeless Tobacco Never  Tobacco Comments   Started smoking at 89 and stopped 26 started again at 36 stopped again at 16    Counseling given: Not Answered Tobacco comments: Started smoking at 81 and stopped 26 started again at 36 stopped again at 46    Outpatient Medications Prior to Visit  Medication Sig Dispense Refill   albuterol  (VENTOLIN  HFA) 108 (90 Base) MCG/ACT inhaler Inhale 2 puffs into the lungs every 6 (six) hours as needed for wheezing or shortness of breath. 18  g 1   amLODipine  (NORVASC ) 10 MG tablet Take 10 mg by mouth daily.     apixaban  (ELIQUIS ) 5 MG TABS tablet TAKE 1 TABLET BY MOUTH TWICE DAILY 60 tablet 5   Coenzyme Q-10 200 MG CAPS Take 200 mg by mouth daily.     DEXILANT 60 MG capsule Take 60 mg by mouth daily.  0   diazepam  (VALIUM ) 5 MG tablet Half Q day 30 tablet 4   hydrochlorothiazide  (HYDRODIURIL ) 12.5 MG tablet Take 12.5 mg by mouth daily. (Patient taking differently: Take 12.5 mg by mouth daily as needed.)     metoprolol  tartrate (LOPRESSOR ) 25 MG tablet Take 1.5 tablets (37.5 mg total) by mouth 2 (two) times daily. 270 tablet 3   PRALUENT 150 MG/ML SOAJ Inject into the skin.     rosuvastatin  (CRESTOR ) 40 MG tablet Take 40 mg by mouth daily.     sertraline  (ZOLOFT ) 50 MG tablet Take 0.5 tablets (25 mg total) by mouth daily. 30 tablet 5   Vitamin D, Ergocalciferol, (DRISDOL) 50000 units CAPS capsule Take 50,000 Units by mouth every Wednesday.   2   zolpidem (AMBIEN) 5 MG tablet Take 1 tablet (5 mg total) by mouth at bedtime as needed for sleep. 30 tablet 4   buPROPion  (WELLBUTRIN  XL) 150 MG 24 hr tablet Take 1 tablet (150 mg total) by mouth daily. (Patient not taking: Reported on 01/18/2024) 30 tablet 3   traZODone  (DESYREL ) 50 MG tablet TAKE 1 TABLET(50 MG)  BY MOUTH AT BEDTIME AS NEEDED FOR SLEEP (Patient not taking: Reported on 01/18/2024) 30 tablet 1   No facility-administered medications prior to visit.   Review of Systems  Review of Systems  Constitutional: Negative.   Respiratory: Negative.    Cardiovascular:  Positive for palpitations.  Psychiatric/Behavioral:  Positive for sleep disturbance. The patient is nervous/anxious.    Physical Exam  BP 108/68   Pulse 68   Temp 98.1 F (36.7 C) (Oral)   Ht 5' 2 (1.575 m)   Wt 117 lb 3.2 oz (53.2 kg)   SpO2 98%   BMI 21.44 kg/m  Physical Exam Constitutional:      General: She is not in acute distress.    Appearance: Normal appearance. She is not ill-appearing.  HENT:      Head: Normocephalic and atraumatic.     Mouth/Throat:     Mouth: Mucous membranes are moist.     Pharynx: Oropharynx is clear.  Cardiovascular:     Rate and Rhythm: Normal rate and regular rhythm.     Heart sounds: No murmur heard.    No gallop.  Pulmonary:     Effort: Pulmonary effort is normal.     Breath sounds: Normal breath sounds.  Musculoskeletal:        General: Normal range of motion.  Skin:    General: Skin is warm and dry.  Neurological:     General: No focal deficit present.     Mental Status: She is alert and oriented to person, place, and time. Mental status is at baseline.  Psychiatric:        Mood and Affect: Mood normal.        Behavior: Behavior normal.        Thought Content: Thought content normal.        Judgment: Judgment normal.     Lab Results:  CBC    Component Value Date/Time   WBC 5.0 05/20/2022 1539   WBC 4.5 01/11/2022 1800   RBC 4.63 05/20/2022 1539   RBC 4.58 01/11/2022 1800   HGB 12.9 05/20/2022 1539   HCT 39.6 05/20/2022 1539   PLT 363 05/20/2022 1539   MCV 86 05/20/2022 1539   MCH 27.9 05/20/2022 1539   MCH 28.6 01/11/2022 1800   MCHC 32.6 05/20/2022 1539   MCHC 32.6 01/11/2022 1800   RDW 12.7 05/20/2022 1539   LYMPHSABS 1.7 05/20/2022 1539   MONOABS 0.4 12/16/2020 1742   EOSABS 0.0 05/20/2022 1539   BASOSABS 0.0 05/20/2022 1539    BMET    Component Value Date/Time   NA 143 06/01/2022 1156   K 3.8 06/01/2022 1156   CL 105 06/01/2022 1156   CO2 25 06/01/2022 1156   GLUCOSE 90 06/01/2022 1156   GLUCOSE 120 (H) 01/11/2022 1800   BUN 14 06/01/2022 1156   CREATININE 0.93 06/01/2022 1156   CALCIUM  8.9 06/01/2022 1156   GFRNONAA >60 01/11/2022 1800   GFRAA >60 08/01/2019 2117    BNP    Component Value Date/Time   BNP 25.7 07/07/2019 1617    ProBNP No results found for: PROBNP  Imaging: No results found.   Assessment & Plan:     Assessment & Plan Insomnia Chronic insomnia with difficulty initiating and  maintaining sleep. Previous sleep study was negative for obstructive sleep apnea. Current sleep schedule is irregular, with bedtime around 2-4 AM. Recently started on Ambien 5 mg but hesitant to use it regularly due to concerns about heart palpitations. Discussed that Ambien  typically slows heart rate and respiratory rate, and is unlikely to cause increased heart rate. Encouraged to establish a consistent bedtime and consider cognitive behavioral therapy for insomnia. - Encourage taking Ambien 5 mg at 11:30 PM and be in bed by midnight or 12:30 AM. - Provide contact information for a cognitive behavioral therapist specializing in insomnia.  Anxiety and depression Ongoing anxiety and depression, contributing to sleep disturbances and palpitations. Currently on Zoloft , recently increased to 50 mg. Discussed that it may take 4-8 weeks to notice benefits. Encouraged to continue psychiatric follow-up for medication management. - Continue Zoloft  50 mg daily. - Follow up with psychiatrist for potential dose adjustment if anxiety or depression persists.  Palpitations Intermittent palpitations with reported heart rate increasing to over 100 bpm. Recent stress test in June showed normal results with appropriate blood pressure response and normal sinus rhythm. Current management includes metoprolol  for tachycardia. - Continue metoprolol  as prescribed.  Hypertension Hypertension currently managed with Norvasc , metoprolol , and hydrochlorothiazide  as needed. Recent blood pressure readings have been lower than usual, but appropriate response during stress test. - Continue Norvasc  and metoprolol  as prescribed. - Use hydrochlorothiazide  as needed for swelling.  Hx pulmonary embolism - Managed by PCP and cardiology. On indefinite anticoagulation - Continue Eliquis  5mg  twice daily    Almarie LELON Ferrari, NP 01/24/2024

## 2024-03-14 ENCOUNTER — Ambulatory Visit (HOSPITAL_COMMUNITY): Admitting: Psychiatry

## 2024-03-14 DIAGNOSIS — F329 Major depressive disorder, single episode, unspecified: Secondary | ICD-10-CM

## 2024-03-14 MED ORDER — SERTRALINE HCL 100 MG PO TABS: 100.0000 mg | ORAL_TABLET | Freq: Every day | ORAL | 4 refills | Status: AC

## 2024-03-14 MED ORDER — DIAZEPAM 5 MG PO TABS: ORAL_TABLET | ORAL | 4 refills | Status: AC

## 2024-03-14 MED ORDER — ZOLPIDEM TARTRATE 5 MG PO TABS: 5.0000 mg | ORAL_TABLET | Freq: Every evening | ORAL | 4 refills | Status: AC | PRN

## 2024-03-14 NOTE — Progress Notes (Signed)
 Psychiatric Initial Adult Assessment   Patient Identification: Hannah [name] MRN:  [id number] Date of Evaluation:  03/14/2024 Referral Source:  Chief Complaint:   Visit Diagnosis:   History of Present Illness:      Today the patient is doing only fairly well.  Her main symptom seems to be excessive anxiety.  This is affecting her self-confidence.  She has very few significant stresses.  Her son is doing well as her grandson is.  The patient is requesting to be in therapy.  She takes 2.5 mg of Valium  she takes 50 mg of Zoloft  she takes 5 mg of Ambien .  She is actually sleeping okay her energy level is actually improved.  Her appetite is down.  She says she is losing weight significantly. Associated Signs/Symptoms: Depression Symptoms: Daily depression (Hypo) Manic Symptoms:   Anxiety Symptoms:  Excessive Worry, Psychotic Symptoms:   PTSD Symptoms: NA  Past Psychiatric History: Minimal  Previous Psychotropic Medications: Yes   Substance Abuse History in the last 12 months:  No.  Consequences of Substance Abuse: NA  Past Medical History:  Past Medical History:  Diagnosis Date   Acute pulmonary embolism (HCC) 07/07/2019   Chest pain with normal coronary angiography 08/09/2014   Cath 2013   Dyslipidemia 06/25/2013   Zetia added Feb 2020- PCP follows   Essential hypertension 06/25/2013   Echo June 2020- EF 50-55%, diastolic dysfunction   Family history of coronary artery disease 01/17/2019   Father and two brothers with early CAD   Family history of heart disease    Hyperlipidemia    Hypertension    Leriche syndrome (HCC)    Normocytic anemia 11/28/2016   Palpitations 06/08/2016   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 Some claudication but she declines further work up. ABis moderately decreased March 2020   Pulmonary emboli (HCC) 07/07/2019   Acute pulmonary lesion    Past  Surgical History:  Procedure Laterality Date   ABI - BILATERAL  2008   moderate arterial insufficiency at rest; pulsatile flow of bilateral ankle PVRs   APPENDECTOMY  11/21/2002   acute appendicitis   LEFT HEART CATHETERIZATION WITH CORONARY ANGIOGRAM N/A 11/10/2011   Procedure: LEFT HEART CATHETERIZATION WITH CORONARY ANGIOGRAM;  Surgeon: Dorn [name], MD;  Location: [facility] CATH LAB;  Service: Cardiovascular;  Laterality: N/A; - normal left main/LAD/L Cfx/RCA   NM MYOCAR PERF WALL MOTION  10/2011   lexiscan - EF71%, normal perfusion; low risk scan   ROTATOR CUFF REPAIR  1995   TRANSTHORACIC ECHOCARDIOGRAM  10/2011   EF=>55%, calcified moderator band in the RV; borderline LA enlargement; mild MR; mild TR; trace AV regurg & pulm valve regurg    Family Psychiatric History:   Family History:  Family History  Problem Relation Age of Onset   Dementia Mother    Heart disease Father    Hypertension Father    Cancer Father    Heart attack Father        in 40s   Stroke Sister    Breast cancer Sister 54   Hypertension Sister    Hypertension Brother        2 with CABG   Heart Problems Child        born with enlarged heart    Social History:   Social History   Socioeconomic History   Marital status: Married    Spouse name: Not on file  Number of children: 1   Years of education: 12   Highest education level: Not on file  Occupational History   Not on file  Tobacco Use   Smoking status: Former    Current packs/day: 0.00    Average packs/day: 0.5 packs/day for 23.0 years (11.5 ttl pk-yrs)    Types: Cigarettes    Start date: 65    Quit date: 1989    Years since quitting: 36.9   Smokeless tobacco: Never   Tobacco comments:    Started smoking at 51 and stopped 26 started again at 36 stopped again at 75   Substance and Sexual Activity   Alcohol use: Yes    Comment: occasional wine   Drug use: No   Sexual activity: Not Currently  Other Topics Concern   Not on file  Social  History Narrative   Left Handed    Lives in a 2 story with a basement. Lives with husband    Social Drivers of Corporate Investment Banker Strain: Not on file  Food Insecurity: Not on file  Transportation Needs: Not on file  Physical Activity: Not on file  Stress: Not on file  Social Connections: Not on file    Additional Social History:   Allergies:   Allergies  Allergen Reactions   Amoxicillin Swelling   Ramipril Swelling    Angioedema     Metabolic Disorder Labs: No results found for: HGBA1C, MPG No results found for: PROLACTIN No results found for: CHOL, TRIG, HDL, CHOLHDL, VLDL, LDLCALC Lab Results  Component Value Date   TSH 1.810 03/25/2022    Therapeutic Level Labs: No results found for: LITHIUM No results found for: CBMZ No results found for: VALPROATE  Current Medications: Current Outpatient Medications  Medication Sig Dispense Refill   albuterol  (VENTOLIN  HFA) 108 (90 Base) MCG/ACT inhaler Inhale 2 puffs into the lungs every 6 (six) hours as needed for wheezing or shortness of breath. 18 g 1   amLODipine  (NORVASC ) 10 MG tablet Take 10 mg by mouth daily.     apixaban  (ELIQUIS ) 5 MG TABS tablet TAKE 1 TABLET BY MOUTH TWICE DAILY 60 tablet 5   Coenzyme Q-10 200 MG CAPS Take 200 mg by mouth daily.     DEXILANT 60 MG capsule Take 60 mg by mouth daily.  0   diazepam  (VALIUM ) 5 MG tablet Half Q day 30 tablet 4   hydrochlorothiazide  (HYDRODIURIL ) 12.5 MG tablet Take 12.5 mg by mouth daily. (Patient taking differently: Take 12.5 mg by mouth daily as needed.)     metoprolol  tartrate (LOPRESSOR ) 25 MG tablet Take 1.5 tablets (37.5 mg total) by mouth 2 (two) times daily. 270 tablet 3   PRALUENT 150 MG/ML SOAJ Inject into the skin.     rosuvastatin  (CRESTOR ) 40 MG tablet Take 40 mg by mouth daily.     sertraline  (ZOLOFT ) 100 MG tablet Take 1 tablet (100 mg total) by mouth daily. 30 tablet 4   Vitamin D, Ergocalciferol, (DRISDOL) 50000 units  CAPS capsule Take 50,000 Units by mouth every Wednesday.   2   zolpidem  (AMBIEN ) 5 MG tablet Take 1 tablet (5 mg total) by mouth at bedtime as needed for sleep. 30 tablet 4   No current facility-administered medications for this visit.    Musculoskeletal: Strength & Muscle Tone: within normal limits Gait & Station: normal Patient leans: N/A  Psychiatric Specialty Exam: Review of Systems  There were no vitals taken for this visit.There is no height or weight  on file to calculate BMI.  General Appearance: Casual  Eye Contact:  Good  Speech:  Clear and Coherent  Volume:  Normal  Mood:  Anxious  Affect:  Congruent  Thought Process:  Goal Directed  Orientation:  Full (Time, Place, and Person)  Thought Content:  WDL  Suicidal Thoughts:  No  Homicidal Thoughts:  No  Memory:  NA  Judgement:  Good  Insight:  Good  Psychomotor Activity:    Concentration:  Concentration: Fair  Recall:  Good  Fund of Knowledge:Good  Language: Good  Akathisia:  No  Handed:  Right  AIMS (if indicated):  not done  Assets:  Desire for Improvement  ADL's:  Intact  Cognition: WNL  Sleep:  Fair   Screenings: Flowsheet Row ED from 01/11/2022 in [facility] Emergency Department at [facility] ED from 04/08/2021 in [facility] Emergency Department at [facility] ED from 12/16/2020 in [facility] Emergency Department at [facility]  C-SSRS RISK CATEGORY No Risk No Risk No Risk    Assessment and Plan:     At this time our interventions will be to increase her Zoloft  from 50 to 100 mg.  Her diagnosis is major depression.  It is noted that she has never been on Effexor.  She is also never been on Remeron.  She will continue taking Valium  2.5 mg for anxiety and Ambien  5 mg.  Patient will return to see us  in 6 weeks.  Will make every effort to get her therapist.  We gave her a list of 3 different therapist to see.  Collaboration of Care:   Patient/Guardian was advised Release of  Information must be obtained prior to any record release in order to collaborate their care with an outside provider. Patient/Guardian was advised if they have not already done so to contact the registration department to sign all necessary forms in order for us  to release information regarding their care.   Consent: Patient/Guardian gives verbal consent for treatment and assignment of benefits for services provided during this visit. Patient/Guardian expressed understanding and agreed to proceed.   Elna LILLETTE Lo, MD 12/3/20253:44 PM

## 2024-03-26 ENCOUNTER — Ambulatory Visit: Admitting: Cardiovascular Disease

## 2024-03-26 ENCOUNTER — Encounter: Payer: Self-pay | Admitting: Cardiovascular Disease

## 2024-03-26 VITALS — BP 144/80 | HR 60 | Ht 62.0 in | Wt 116.0 lb

## 2024-03-26 DIAGNOSIS — E785 Hyperlipidemia, unspecified: Secondary | ICD-10-CM | POA: Diagnosis present

## 2024-03-26 DIAGNOSIS — I739 Peripheral vascular disease, unspecified: Secondary | ICD-10-CM | POA: Insufficient documentation

## 2024-03-26 DIAGNOSIS — R079 Chest pain, unspecified: Secondary | ICD-10-CM

## 2024-03-26 DIAGNOSIS — I10 Essential (primary) hypertension: Secondary | ICD-10-CM

## 2024-03-26 DIAGNOSIS — R002 Palpitations: Secondary | ICD-10-CM | POA: Diagnosis present

## 2024-03-26 NOTE — Assessment & Plan Note (Signed)
 History of dyslipidemia on rosuvastatin , Praluent with lipid profile performed 03/22/2023 revealing total cholesterol 129, LDL 46 and HDL of 72.

## 2024-03-26 NOTE — Patient Instructions (Signed)
 Medication Instructions:  Your physician recommends that you continue on your current medications as directed. Please refer to the Current Medication list given to you today.  *If you need a refill on your cardiac medications before your next appointment, please call your pharmacy*    Follow-Up: At [facility], you and your health needs are our priority.  As part of our continuing mission to provide you with exceptional heart care, our providers are all part of one team.  This team includes your primary Cardiologist (physician) and Advanced Practice Providers or APPs (Physician Assistants and Nurse Practitioners) who all work together to provide you with the care you need, when you need it.  Your next appointment:   6 month(s)  Provider:   Reche Finder, NP     We recommend signing up for the patient portal called MyChart.  Sign up information is provided on this After Visit Summary.  MyChart is used to connect with patients for Virtual Visits (Telemedicine).  Patients are able to view lab/test results, encounter notes, upcoming appointments, etc.  Non-urgent messages can be sent to your provider as well.   To learn more about what you can do with MyChart, go to [email or web address].   Other Instructions Dr. Court has requested that you schedule an appointment with one of our clinical pharmacists for a blood pressure check appointment within the next 6-8 weeks.  If you monitor your blood pressure (BP) at home, please bring your BP cuff and your BP readings with you to this appointment  HOW TO TAKE YOUR BLOOD PRESSURE: Rest 5 minutes before taking your blood pressure. Dont smoke or drink caffeinated beverages for at least 30 minutes before. Take your blood pressure before (not after) you eat. Sit comfortably with your back supported and both feet on the floor (dont cross your legs). Elevate your arm to heart level on a table or a desk. Use the proper sized cuff. It  should fit smoothly and snugly around your bare upper arm. There should be enough room to slip a fingertip under the cuff. The bottom edge of the cuff should be 1 inch above the crease of the elbow. Ideally, take 3 measurements at one sitting and record the average.

## 2024-03-26 NOTE — Assessment & Plan Note (Signed)
 History of essential hypertension blood pressure measured today at 144/80.  Her blood pressure recently has been normal when measured in the office setting although she says at home it has been elevated.  I have asked her to keep a 2-week blood pressure log and come back to review with a Pharm.D.

## 2024-03-26 NOTE — Assessment & Plan Note (Signed)
 History of PAD with known Leriche syndrome by PV angio in 2013.  She does not wish further workup.  She does have mild claudication.

## 2024-03-26 NOTE — Progress Notes (Signed)
 03/26/2024 Hannah [name]   09-Oct-1945  [id number]  Primary Physician Hannah Carwin, MD Primary Cardiologist: Hannah [name] MD Hannah [name], [name]  HPI:  Hannah [name] is a 78 y.o.    thin-appearing married African American female, mother of 1 child, who I last saw in the office 06/01/2022.  She has a history of normal coronary arteries by catheterization, which I performed November 10, 2011. 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 She really denies significant lifestyle-limiting claudication. Her other problems include remote tobacco abuse, having quit over 20 years ago, treated hypertension, hyperlipidemia, as well as a strong family history for heart disease with a father who had an MI in his 30s and 2 brothers who have had bypass surgery. Since I saw her a year ago , she did have some cramping in her right calf however venous Dopplers were negative for DVT. She does have mild lifestyle limiting claudication which she does not wish to pursue regarding surgical revascularization of her Leriche syndrome. She ALSO gets occasional morning palpitations and has worn an event monitor in the past which was unrevealing.    Since I saw her a year ago she is remained stable.  She complains of occasional intermittent chest pain, shortness of breath and palpitations although her work-up has been negative.  Dopplers revealed ABIs in the 0.7 range bilaterally.   Since I saw her 6 months ago she continues to complain of lifestyle limiting claudication although she does not wish surgical revascularization.  Her ABIs performed yesterday were unchanged.  She also complains of shortness of breath and morning palpitations.  She drinks 1 cup of coffee a day.  Prior work-up for palpitations performed years ago with an event monitor showed only sinus rhythm.   Lower   extremity arterial Doppler studies performed 06/13/2018 revealed a right ABI 0.69 and a left ABI 0.74.  Recent event monitor performed 08/03/2018 revealed episodes of PSVT and nonsustained ventricular tachycardia.  Her last 2D echo performed 11/29/2016 was essentially normal.     She was admitted on 07/07/2019 to [facility] with chest pain and shortness of breath.  A D-dimer was mildly elevated and it chest CTA was remarkable for pulmonary embolus for which she was placed on Eliquis  oral anticoagulation.  This occurred 4 days after her second Pfizer vaccine shot.   Since I saw her in the office almost 2 year ago she has remained stable.  She still occasionally gets palpitations on beta-blocker which has been uptitrated by Hannah Finder, NP.  Her most recent event monitor performed 03/25/2022 did show short runs of SVT.  She has some claudication but it is not lifestyle limiting.  She remains on Eliquis  oral anticoagulation because of history of pulmonary embolism.  She had been complaining of increasing dyspnea and had a negative CPX tests by Dr. Shellia.  In addition, I did do a coronary CTA on her 06/13/2022 that revealed a coronary calcium  score of 0 with no evidence of CAD.  She also admits to suffering from depression recently.  Active Medications[1]   Allergies[2]  Social History   Socioeconomic History   Marital status: Married    Spouse name: Not on file   Number of children: 1   Years of education: 12   Highest education level: Not on file  Occupational History   Not on file  Tobacco Use   Smoking status: Former    Current packs/day: 0.00    Average packs/day: 0.5 packs/day for 23.0 years (11.5 ttl pk-yrs)    Types: Cigarettes    Start date: 67    Quit date: 1989    Years since quitting: 36.9   Smokeless tobacco: Never   Tobacco comments:    Started smoking at 36 and stopped 26 started again at 36 stopped again at 22   Substance and Sexual Activity   Alcohol use: Yes     Comment: occasional wine   Drug use: No   Sexual activity: Not Currently  Other Topics Concern   Not on file  Social History Narrative   Left Handed    Lives in a 2 story with a basement. Lives with husband    Social Drivers of Health   Tobacco Use: Medium Risk (03/26/2024)   Patient History    Smoking Tobacco Use: Former    Smokeless Tobacco Use: Never    Passive Exposure: Not on Actuary Strain: Not on file  Food Insecurity: Not on file  Transportation Needs: Not on file  Physical Activity: Not on file  Stress: Not on file  Social Connections: Not on file  Intimate Partner Violence: Not on file  Depression (EYV7-0): Not on file  Alcohol Screen: Not on file  Housing: Not on file  Utilities: Not on file  Health Literacy: Not on file     Review of Systems: General: negative for chills, fever, night sweats or weight changes.  Cardiovascular: negative for chest pain, dyspnea on exertion, edema, orthopnea, palpitations, paroxysmal nocturnal dyspnea or shortness of breath Dermatological: negative for rash Respiratory: negative for cough or wheezing Urologic: negative for hematuria Abdominal: negative for nausea, vomiting, diarrhea, bright red blood per rectum, melena, or hematemesis Neurologic: negative for visual changes, syncope, or dizziness All other systems reviewed and are otherwise negative except as noted above.    Blood pressure (!) 144/80, pulse 60, height 5' 2 (1.575 m), weight 116 lb (52.6 kg), SpO2 95%.  General appearance: alert and no distress Neck: no adenopathy, no carotid bruit, no JVD, supple, symmetrical, trachea midline, and thyroid  not enlarged, symmetric, no tenderness/mass/nodules Lungs: clear to auscultation bilaterally Heart: regular rate and rhythm, S1, S2 normal, no murmur, click, rub or gallop Extremities: extremities normal, atraumatic, no cyanosis or edema Pulses: Absent pedal pulses Skin: Skin color, texture, turgor normal.  No rashes or lesions Neurologic: Grossly normal  EKG not performed today      ASSESSMENT AND PLAN:   Peripheral arterial disease History of PAD with known Leriche syndrome by PV angio in 2013.  She does not wish further workup.  She does have mild claudication.  Essential hypertension History of essential hypertension blood pressure measured today at 144/80.  Her blood pressure recently has been normal when measured in the office setting although she says at home it has been elevated.  I have asked her to keep a 2-week blood pressure log and come back to review with a Pharm.D.  Dyslipidemia History of dyslipidemia on rosuvastatin , Praluent with lipid profile performed 03/22/2023 revealing total cholesterol 129, LDL 46 and HDL of 72.  Chest pain with normal coronary angiography History of normal coronary arteries by cath which I performed 11/10/2011.  Because of some atypical chest pain we performed coronary CTA on her 06/13/2022 that revealed a coronary calcium  score of 0 without evidence of CAD.  Palpitations History of palpitations with event monitor that showed  short runs of SVT improved on oral beta-blocker.     Hannah DOROTHA Lesches MD FACP,FACC,FAHA, FSCAI 03/26/2024 3:24 PM    [1]  Current Meds  Medication Sig   amLODipine  (NORVASC ) 10 MG tablet Take 10 mg by mouth daily.   apixaban  (ELIQUIS ) 5 MG TABS tablet TAKE 1 TABLET BY MOUTH TWICE DAILY   Coenzyme Q-10 200 MG CAPS Take 200 mg by mouth daily.   DEXILANT 60 MG capsule Take 60 mg by mouth daily.   diazepam  (VALIUM ) 5 MG tablet Half Q day   hydrochlorothiazide  (HYDRODIURIL ) 12.5 MG tablet Take 12.5 mg by mouth daily. (Patient taking differently: Take 12.5 mg by mouth daily as needed.)   metoprolol  tartrate (LOPRESSOR ) 25 MG tablet Take 1.5 tablets (37.5 mg total) by mouth 2 (two) times daily.   PRALUENT 150 MG/ML SOAJ Inject into the skin.   rosuvastatin  (CRESTOR ) 40 MG tablet Take 40 mg by mouth daily.   sertraline   (ZOLOFT ) 100 MG tablet Take 1 tablet (100 mg total) by mouth daily.   Vitamin D, Ergocalciferol, (DRISDOL) 50000 units CAPS capsule Take 50,000 Units by mouth every Wednesday.    zolpidem  (AMBIEN ) 5 MG tablet Take 1 tablet (5 mg total) by mouth at bedtime as needed for sleep.  [2]  Allergies Allergen Reactions   Amoxicillin Swelling   Ramipril Swelling    Angioedema

## 2024-03-26 NOTE — Assessment & Plan Note (Signed)
 History of palpitations with event monitor that showed short runs of SVT improved on oral beta-blocker.

## 2024-03-26 NOTE — Assessment & Plan Note (Signed)
 History of normal coronary arteries by cath which I performed 11/10/2011.  Because of some atypical chest pain we performed coronary CTA on her 06/13/2022 that revealed a coronary calcium  score of 0 without evidence of CAD.

## 2024-04-10 ENCOUNTER — Other Ambulatory Visit: Payer: Self-pay | Admitting: Cardiovascular Disease

## 2024-04-10 DIAGNOSIS — I2699 Other pulmonary embolism without acute cor pulmonale: Secondary | ICD-10-CM

## 2024-04-10 NOTE — Telephone Encounter (Signed)
 Prescription refill request for Eliquis  received. Indication:palps Last office visit:12/25 Scr: 0.92  2024 Age:78 Weight:52.6  kg  Prescription refilled

## 2024-04-25 ENCOUNTER — Ambulatory Visit (HOSPITAL_COMMUNITY): Admitting: Psychiatry

## 2024-05-07 NOTE — Progress Notes (Unsigned)
 Patient ID: Hannah [name]                 DOB: 1946/03/27                      MRN: [id number]      HPI: Hannah [name] is a 79 y.o. female referred by Dr. Court to HTN clinic. PMH is significant for PAD, HTN, HLD, chest pain (normal coronary angiography 2013, 2024), and palpitations (monitored showed short runs of SVT).   Last visit with Dr. Wadie on 03/26/24. BP of 144/80 mmHg. The patient reported elevated readings at home, despite better controlled readings in office. She was instructed to keep a two week log of readings and follow-up with pharmacy.   At today's visit, ***  Home BP readings: *** If elevated, have room to increase hydrochlorothiazide ; likely not 100% allergic to ACEi, can consider adding on a low-dose ARB  Current HTN meds:  Amlodipine  10 mg daily Hydrochlorothiazide  12.5 mg daily Metoprolol  tartrate 37.5 mg twice daily Previously tried:  Ramipril (angioedema - edema of the upper lip with no associated tongue or throat swelling, shortness of breath, or difficulty speaking) Per note on 07/23/2016, the patient had been taking ramipril for an extended period of time, but noticed facial swelling of her upper lip when she was started on Augmentin for bronchitis. Augmentin and ramipril were discontinued at that time.  BP goal: <130/80   Family History:  Family History  Problem Relation Age of Onset   Dementia Mother    Heart disease Father    Hypertension Father    Cancer Father    Heart attack Father        in 8s   Stroke Sister    Breast cancer Sister 13   Hypertension Sister    Hypertension Brother        2 with CABG   Heart Problems Child        born with enlarged heart   Social History:  Tobacco: former *** EtOH: ***  Diet: ***  Exercise: *** {types:28256}  Home BP readings:  Date SBP/DBP  HR                              Average     Wt Readings from Last 3 Encounters:  03/26/24 116 lb (52.6 kg)  01/24/24 117 lb 3.2 oz (53.2  kg)  01/18/24 117 lb (53.1 kg)   BP Readings from Last 3 Encounters:  03/26/24 (!) 144/80  01/24/24 108/68  01/18/24 (!) 149/79   Pulse Readings from Last 3 Encounters:  03/26/24 60  01/24/24 68  01/18/24 (!) 59   Renal function: CrCl cannot be calculated (Patient's most recent lab result is older than the maximum 21 days allowed.).  Past Medical History:  Diagnosis Date   Acute pulmonary embolism (HCC) 07/07/2019   Chest pain with normal coronary angiography 08/09/2014   Cath 2013   Dyslipidemia 06/25/2013   Zetia added Feb 2020- PCP follows   Essential hypertension 06/25/2013   Echo June 2020- EF 50-55%, diastolic dysfunction   Family history of coronary artery disease 01/17/2019   Father and two brothers with early CAD   Family history of heart disease    Hyperlipidemia    Hypertension    Leriche syndrome (HCC)    Normocytic anemia 11/28/2016   Palpitations 06/08/2016   Rare PSVT and NSVT on monitor April 2020- medications  adjusted   Peripheral arterial disease 06/25/2013   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 Some claudication but she declines further work up. ABis moderately decreased March 2020   Pulmonary emboli (HCC) 07/07/2019   Acute pulmonary lesion   Medications Ordered Prior to Encounter[1]  Allergies[2]  There were no vitals taken for this visit.   Assessment/Plan: No BP recorded.  {Refresh Note OR Click here to enter BP  :1}***   1. Hypertension -  No problem-specific Assessment & Plan notes found for this encounter.      Thank you,  ***  Melissa D Maccia, Pharm.JONETTA SARAN, CPP [facility] of [facility] [address], [place], [place] [address]  Phone: [phone]; Fax: [phone]     [1]  Current Outpatient Medications on File Prior to Visit  Medication Sig Dispense Refill   albuterol  (VENTOLIN  HFA) 108 (90 Base) MCG/ACT inhaler Inhale 2 puffs into the lungs every 6 (six) hours as needed for wheezing or  shortness of breath. (Patient not taking: Reported on 03/26/2024) 18 g 1   amLODipine  (NORVASC ) 10 MG tablet Take 10 mg by mouth daily.     Coenzyme Q-10 200 MG CAPS Take 200 mg by mouth daily.     DEXILANT 60 MG capsule Take 60 mg by mouth daily.  0   diazepam  (VALIUM ) 5 MG tablet Half Q day 30 tablet 4   ELIQUIS  5 MG TABS tablet TAKE 1 TABLET BY MOUTH TWICE DAILY 60 tablet 5   hydrochlorothiazide  (HYDRODIURIL ) 12.5 MG tablet Take 12.5 mg by mouth daily. (Patient taking differently: Take 12.5 mg by mouth daily as needed.)     metoprolol  tartrate (LOPRESSOR ) 25 MG tablet Take 1.5 tablets (37.5 mg total) by mouth 2 (two) times daily. 270 tablet 3   PRALUENT 150 MG/ML SOAJ Inject into the skin.     rosuvastatin  (CRESTOR ) 40 MG tablet Take 40 mg by mouth daily.     sertraline  (ZOLOFT ) 100 MG tablet Take 1 tablet (100 mg total) by mouth daily. 30 tablet 4   Vitamin D, Ergocalciferol, (DRISDOL) 50000 units CAPS capsule Take 50,000 Units by mouth every Wednesday.   2   zolpidem  (AMBIEN ) 5 MG tablet Take 1 tablet (5 mg total) by mouth at bedtime as needed for sleep. 30 tablet 4   No current facility-administered medications on file prior to visit.  [2]  Allergies Allergen Reactions   Amoxicillin Swelling   Ramipril Swelling    Angioedema

## 2024-05-08 ENCOUNTER — Ambulatory Visit (HOSPITAL_COMMUNITY): Admitting: Psychiatry

## 2024-05-10 ENCOUNTER — Ambulatory Visit: Admitting: Pharmacist

## 2024-05-22 ENCOUNTER — Ambulatory Visit (HOSPITAL_COMMUNITY): Admitting: Psychiatry

## 2024-06-12 ENCOUNTER — Ambulatory Visit: Admitting: Dermatology

## 2024-06-18 ENCOUNTER — Ambulatory Visit

## 2024-08-24 ENCOUNTER — Ambulatory Visit (HOSPITAL_BASED_OUTPATIENT_CLINIC_OR_DEPARTMENT_OTHER): Admitting: Family
